# Patient Record
Sex: Male | Born: 1955 | Race: White | Hispanic: No | Marital: Married | Smoking: Never smoker
Health system: Southern US, Community
[De-identification: ages and names within clinical notes are randomized; demographics above are authoritative.]

## PROBLEM LIST (undated history)

## (undated) DIAGNOSIS — C61 Malignant neoplasm of prostate: Secondary | ICD-10-CM

## (undated) DIAGNOSIS — I499 Cardiac arrhythmia, unspecified: Secondary | ICD-10-CM

## (undated) DIAGNOSIS — I10 Essential (primary) hypertension: Secondary | ICD-10-CM

## (undated) DIAGNOSIS — G629 Polyneuropathy, unspecified: Secondary | ICD-10-CM

## (undated) DIAGNOSIS — E119 Type 2 diabetes mellitus without complications: Secondary | ICD-10-CM

## (undated) DIAGNOSIS — Z9289 Personal history of other medical treatment: Secondary | ICD-10-CM

## (undated) DIAGNOSIS — E785 Hyperlipidemia, unspecified: Secondary | ICD-10-CM

## (undated) HISTORY — PX: HERNIA REPAIR: SHX51

---

## 2007-01-10 ENCOUNTER — Emergency Department (HOSPITAL_COMMUNITY): Admission: EM | Admit: 2007-01-10 | Discharge: 2007-01-11 | Payer: Self-pay | Admitting: Emergency Medicine

## 2012-10-04 DIAGNOSIS — Z9289 Personal history of other medical treatment: Secondary | ICD-10-CM

## 2012-10-04 HISTORY — DX: Personal history of other medical treatment: Z92.89

## 2013-08-15 ENCOUNTER — Encounter: Payer: Self-pay | Admitting: Family Medicine

## 2013-08-15 ENCOUNTER — Ambulatory Visit (INDEPENDENT_AMBULATORY_CARE_PROVIDER_SITE_OTHER): Payer: Self-pay | Admitting: Family Medicine

## 2013-08-15 ENCOUNTER — Ambulatory Visit (HOSPITAL_COMMUNITY)
Admission: RE | Admit: 2013-08-15 | Discharge: 2013-08-15 | Disposition: A | Discharge status: Home / Self Care | Payer: Self-pay | Source: Ambulatory Visit | Attending: Family Medicine | Admitting: Family Medicine

## 2013-08-15 VITALS — BP 132/98 | Temp 98.8°F | Ht 74.0 in | Wt 295.0 lb

## 2013-08-15 DIAGNOSIS — R0602 Shortness of breath: Secondary | ICD-10-CM | POA: Insufficient documentation

## 2013-08-15 DIAGNOSIS — R079 Chest pain, unspecified: Secondary | ICD-10-CM

## 2013-08-15 DIAGNOSIS — J9819 Other pulmonary collapse: Secondary | ICD-10-CM | POA: Insufficient documentation

## 2013-08-15 DIAGNOSIS — R7301 Impaired fasting glucose: Secondary | ICD-10-CM

## 2013-08-15 LAB — CBC WITH DIFFERENTIAL/PLATELET
Basophils Absolute: 0 10*3/uL (ref 0.0–0.1)
Basophils Relative: 0 % (ref 0–1)
Eosinophils Absolute: 0.2 10*3/uL (ref 0.0–0.7)
Eosinophils Relative: 3 % (ref 0–5)
MCH: 30.5 pg (ref 26.0–34.0)
MCV: 89.1 fL (ref 78.0–100.0)
Platelets: 203 10*3/uL (ref 150–400)
RDW: 12.7 % (ref 11.5–15.5)
WBC: 6.5 10*3/uL (ref 4.0–10.5)

## 2013-08-15 LAB — BASIC METABOLIC PANEL
Calcium: 9.6 mg/dL (ref 8.4–10.5)
Creat: 0.98 mg/dL (ref 0.50–1.35)

## 2013-08-15 LAB — CK TOTAL AND CKMB (NOT AT ARMC)
CK, MB: 12 ng/mL — ABNORMAL HIGH (ref 0.3–4.0)
Relative Index: 1.9 (ref 0.0–4.0)

## 2013-08-15 MED ORDER — SULFAMETHOXAZOLE-TMP DS 800-160 MG PO TABS: 1.0000 | ORAL_TABLET | Freq: Two times a day (BID) | ORAL | Status: DC

## 2013-08-15 MED ORDER — ALBUTEROL SULFATE HFA 108 (90 BASE) MCG/ACT IN AERS: 2.0000 | INHALATION_SPRAY | Freq: Four times a day (QID) | RESPIRATORY_TRACT | Status: DC | PRN

## 2013-08-15 MED ORDER — ASPIRIN EC 81 MG PO TBEC: 81.0000 mg | DELAYED_RELEASE_TABLET | Freq: Every day | ORAL | Status: DC

## 2013-08-15 NOTE — Progress Notes (Signed)
  Subjective:    Patient ID: Adam Lee, male    DOB: [birth year], 57 y.o.   MRN: [id number]  Cough This is a new problem. The current episode started in the past 7 days. Associated symptoms include headaches and shortness of breath. Associated symptoms comments: Chest congestion.   patient comes in relating that having some congestion and cough but he also relates that he is having shortness of breath he is noted over the past several weeks increased shortness of breath with mild activity it's worse when he does activity  also worse when he lays down he denies chest pain but he states a couple different times he felt like he could not get his breath and he felt like he was going to die. He also relates a couple different times he has had chest tightness.   He has a long history of diabetes, obesity that he has not taken care. This gentleman has not had insurance and is made it very difficult for him.  Patient does not smoke family history past medical history all reviewed    Review of Systems  Respiratory: Positive for cough and shortness of breath.   Neurological: Positive for headaches.   patient relates cough shortness of breath denies swelling in the legs he does relate PND he does relate an episode where he felt some tightness in his chest     Objective:   Physical Exam Neck no abnormal JVD eardrums normal throat is normal lungs diminished breath sounds in the bases more congestion noted in the upper airways heart regular extremities no edema skin warm dry       Assessment & Plan:  Check some stat labs including BNP metabolic 7 troponin I also EKG showed some T wave abnormality but nothing severe await the results of this along with chest x-ray before deciding next that. More than likely will need cardiology referral start baby aspirin today  Diabetes subpar control start metformin once we get lab work back I encouraged patient strongly to followup with [place] every 3 months

## 2013-08-16 ENCOUNTER — Other Ambulatory Visit: Payer: Self-pay | Admitting: *Deleted

## 2013-08-16 ENCOUNTER — Ambulatory Visit (HOSPITAL_COMMUNITY)
Admission: RE | Admit: 2013-08-16 | Discharge: 2013-08-16 | Disposition: A | Discharge status: Home / Self Care | Payer: Self-pay | Source: Ambulatory Visit | Attending: Adult Health | Admitting: Adult Health

## 2013-08-16 ENCOUNTER — Telehealth: Payer: Self-pay | Admitting: Family Medicine

## 2013-08-16 DIAGNOSIS — R0989 Other specified symptoms and signs involving the circulatory and respiratory systems: Secondary | ICD-10-CM | POA: Insufficient documentation

## 2013-08-16 DIAGNOSIS — E785 Hyperlipidemia, unspecified: Secondary | ICD-10-CM

## 2013-08-16 DIAGNOSIS — E119 Type 2 diabetes mellitus without complications: Secondary | ICD-10-CM | POA: Insufficient documentation

## 2013-08-16 DIAGNOSIS — Z6837 Body mass index (BMI) 37.0-37.9, adult: Secondary | ICD-10-CM | POA: Insufficient documentation

## 2013-08-16 DIAGNOSIS — I517 Cardiomegaly: Secondary | ICD-10-CM

## 2013-08-16 DIAGNOSIS — R0609 Other forms of dyspnea: Secondary | ICD-10-CM | POA: Insufficient documentation

## 2013-08-16 DIAGNOSIS — E669 Obesity, unspecified: Secondary | ICD-10-CM | POA: Insufficient documentation

## 2013-08-16 NOTE — Telephone Encounter (Signed)
Cardiology referral is done, I am unable to find his chart so that I can fax over his EKG.  Do you have patient's chart? I have looked everywhere with no luck and patient did state that you called him last night with some lab results. THANKS

## 2013-08-16 NOTE — Progress Notes (Signed)
*  PRELIMINARY RESULTS* Echocardiogram 2D Echocardiogram has been performed.  Adam Lee 08/16/2013, 2:50 PM

## 2013-08-17 LAB — LIPID PANEL
Total CHOL/HDL Ratio: 4.3 Ratio
VLDL: 26 mg/dL (ref 0–40)

## 2013-08-20 ENCOUNTER — Ambulatory Visit (INDEPENDENT_AMBULATORY_CARE_PROVIDER_SITE_OTHER): Payer: Self-pay | Admitting: Cardiovascular Disease

## 2013-08-20 ENCOUNTER — Encounter: Payer: Self-pay | Admitting: *Deleted

## 2013-08-20 ENCOUNTER — Encounter: Payer: Self-pay | Admitting: Cardiovascular Disease

## 2013-08-20 VITALS — BP 118/82 | HR 64 | Ht 74.0 in | Wt 285.0 lb

## 2013-08-20 DIAGNOSIS — E119 Type 2 diabetes mellitus without complications: Secondary | ICD-10-CM | POA: Insufficient documentation

## 2013-08-20 DIAGNOSIS — R0600 Dyspnea, unspecified: Secondary | ICD-10-CM | POA: Insufficient documentation

## 2013-08-20 DIAGNOSIS — R079 Chest pain, unspecified: Secondary | ICD-10-CM | POA: Insufficient documentation

## 2013-08-20 DIAGNOSIS — R0609 Other forms of dyspnea: Secondary | ICD-10-CM

## 2013-08-20 NOTE — Assessment & Plan Note (Signed)
Seems more muscular.  ECG no acute changes.  F/U stress myovue as lateral T wave changes would make ETT non diagnostic  Not clear why CPK elevated but more likely related to rhabdo as troponin is normal  Consider ESR and further w/u for connective tissue or inflamatory disease

## 2013-08-20 NOTE — Assessment & Plan Note (Signed)
EF and BNP normal Not cardiac in origin.  Doubt anginal equivalent. Add claritin or allergy meds consider Netti Pot.  F/U primary CXR with bronchitic changes Consider fluro of left diaphragm to see if it moves

## 2013-08-20 NOTE — Patient Instructions (Signed)
Your physician recommends that you schedule a follow-up appointment in: AS NEEDED  Your physician has requested that you have en exercise stress myoview. For further information please visit [email or web address] Please follow instruction sheet, as given.  Your physician recommends that you return for lab work in: TODAY (SLIPS GIVEN FOR TROPONIN/CPK)   WE WILL CALL YOU WITH YOUR TEST RESULTS/INSTRUCTIONS/NEXT STEPS ONCE RECEIVED BY THE PROVIDER

## 2013-08-20 NOTE — Assessment & Plan Note (Addendum)
Discussed low carb diet and weight loss.  Should probably be started on amaryl or glucophage

## 2013-08-20 NOTE — Progress Notes (Signed)
Patient ID: Adam Lee, male   DOB: 1956-05-14, 57 y.o.   MRN: [id number]   57 yo referred for chest pain, congestion and dyspnea.  He has no known CAD.  Primary concern is dypsnea. He is obese. Has had a "cold" for a few weeks.   Does not take allergy meds. No history of sinus or deviated septum.  Rx with antibiotics and some improvement.  Pains in chest part of overall myalgias.   Plays softball and gets sore all over including chest and shoulders  A1c is elevated at 8.0  LDL is 97.     Echo 08/06/13 reviewed poor quality images but normal EF and mild LVH with no valve disease CXR 08/15/13 bronchitic changes and elevated left hemidiaphram BNP 11/12 normal 29   Troponin negative with CPK 644 RI low 1.9%   ROS: Denies fever, malais, weight loss, blurry vision, decreased visual acuity, cough, sputum, SOB, hemoptysis, pleuritic pain, palpitaitons, heartburn, abdominal pain, melena, lower extremity edema, claudication, or rash.  All other systems reviewed and negative   General: Affect appropriate Obese white male  HEENT: normal Neck supple with no adenopathy JVP normal no bruits no thyromegaly Lungs clear with no wheezing and good diaphragmatic motion Heart:  S1/S2 no murmur,rub, gallop or click PMI normal Abdomen: benighn, BS positve, no tenderness, no AAA no bruit.  No HSM or HJR Distal pulses intact with no bruits No edema Neuro non-focal Skin warm and dry  Multiple skin tags on inside of legs No muscular weakness  Medications Current Outpatient Prescriptions  Medication Sig Dispense Refill  . albuterol (PROVENTIL HFA;VENTOLIN HFA) 108 (90 BASE) MCG/ACT inhaler Inhale 2 puffs into the lungs every 6 (six) hours as needed for wheezing.  1 Inhaler  2  . aspirin EC 81 MG tablet Take 1 tablet (81 mg total) by mouth daily.  30 tablet  6  . sulfamethoxazole-trimethoprim (BACTRIM DS) 800-160 MG per tablet Take 1 tablet by mouth 2 (two) times daily.  14 tablet  0   No current  facility-administered medications for this visit.    Allergies Review of patient's allergies indicates no known allergies.  Family History: No family history on file.  Social History: History   Social History  . Marital Status: Married    Spouse Name: N/A    Number of Children: N/A  . Years of Education: N/A   Occupational History  . Not on file.   Social History Main Topics  . Smoking status: Never Smoker   . Smokeless tobacco: Not on file  . Alcohol Use: Not on file  . Drug Use: Not on file  . Sexual Activity: Not on file   Other Topics Concern  . Not on file   Social History Narrative  . No narrative on file    Electrocardiogram:  SR rate 70 nonspecific lateral T wave changes 08/15/13  Assessment and Plan

## 2013-08-23 ENCOUNTER — Encounter (HOSPITAL_COMMUNITY)
Admission: RE | Admit: 2013-08-23 | Discharge: 2013-08-23 | Disposition: A | Discharge status: Home / Self Care | Payer: Self-pay | Source: Ambulatory Visit | Attending: Cardiovascular Disease | Admitting: Cardiovascular Disease

## 2013-08-23 ENCOUNTER — Encounter (HOSPITAL_COMMUNITY): Payer: Self-pay

## 2013-08-23 DIAGNOSIS — E119 Type 2 diabetes mellitus without complications: Secondary | ICD-10-CM

## 2013-08-23 DIAGNOSIS — R0600 Dyspnea, unspecified: Secondary | ICD-10-CM

## 2013-08-23 DIAGNOSIS — R0609 Other forms of dyspnea: Secondary | ICD-10-CM | POA: Insufficient documentation

## 2013-08-23 DIAGNOSIS — R079 Chest pain, unspecified: Secondary | ICD-10-CM | POA: Insufficient documentation

## 2013-08-23 DIAGNOSIS — R0989 Other specified symptoms and signs involving the circulatory and respiratory systems: Secondary | ICD-10-CM | POA: Insufficient documentation

## 2013-08-23 LAB — TROPONIN I: Troponin I: <0.3 ng/mL (ref <0.30)

## 2013-08-23 LAB — CK TOTAL AND CKMB (NOT AT ARMC): Total CK: 220 U/L (ref 7–232)

## 2013-08-23 MED ORDER — SODIUM CHLORIDE 0.9 % IJ SOLN
INTRAMUSCULAR | Status: AC
  Administered 2013-08-23: 10 mL via INTRAVENOUS
  Filled 2013-08-23: qty 10

## 2013-08-23 MED ORDER — TECHNETIUM TC 99M SESTAMIBI GENERIC - CARDIOLITE
10.0000 | Freq: Once | INTRAVENOUS | Status: AC | PRN
  Administered 2013-08-23: 10 via INTRAVENOUS

## 2013-08-23 MED ORDER — TECHNETIUM TC 99M SESTAMIBI - CARDIOLITE
30.0000 | Freq: Once | INTRAVENOUS | Status: AC | PRN
  Administered 2013-08-23: 13:00:00 30 via INTRAVENOUS

## 2013-08-23 NOTE — Progress Notes (Signed)
Stress Lab Nurses Notes - Adam Lee  Adam Lee 08/23/2013 Reason for doing test: Chest Pain and Dyspnea Type of test: Stress Cardiolite Nurse performing test: Parke Poisson, RN Nuclear Medicine Tech: Lou Cal Echo Tech: Not Applicable MD performing test: Koneswaran/K.Lawrence NP Family MD: Lilyan Punt Test explained and consent signed: yes IV started: 22g jelco, Saline lock flushed, No redness or edema and Saline lock started in radiology Symptoms: mild SOB Treatment/Intervention: None Reason test stopped: fatigue and reached target HR After recovery IV was: Discontinued via X-ray tech and No redness or edema Patient to return to Nuc. Med at : 13:25 Patient discharged: Home Patient's Condition upon discharge was: stable Comments: During test peak BP 179/89 & HR 153. Recovery BP 137/88 & HR 98.  Symptoms resolved in recovery. [name]

## 2013-08-27 ENCOUNTER — Ambulatory Visit: Payer: Self-pay | Admitting: Cardiology

## 2013-08-28 ENCOUNTER — Other Ambulatory Visit: Payer: Self-pay | Admitting: *Deleted

## 2013-08-28 MED ORDER — METFORMIN HCL 500 MG PO TABS: 500.0000 mg | ORAL_TABLET | Freq: Two times a day (BID) | ORAL | Status: DC

## 2013-09-11 ENCOUNTER — Encounter: Payer: Self-pay | Admitting: Family Medicine

## 2013-10-29 ENCOUNTER — Telehealth: Payer: Self-pay | Admitting: Family Medicine

## 2013-10-29 MED ORDER — METFORMIN HCL 500 MG PO TABS: 500.0000 mg | ORAL_TABLET | Freq: Two times a day (BID) | ORAL | Status: DC

## 2013-10-29 NOTE — Telephone Encounter (Signed)
Patient needs Rx for Metformin. He is completely out and needs a refill ASAP. Please call when complete.   [employer]

## 2013-10-29 NOTE — Telephone Encounter (Signed)
30 day supply sent to [facility] Notified patient he needs office visit. Transferred to front desk to schedule.

## 2013-10-31 ENCOUNTER — Encounter: Payer: Self-pay | Admitting: Family Medicine

## 2013-10-31 ENCOUNTER — Ambulatory Visit (INDEPENDENT_AMBULATORY_CARE_PROVIDER_SITE_OTHER): Payer: Self-pay | Admitting: Family Medicine

## 2013-10-31 VITALS — BP 128/86 | Ht 74.0 in | Wt 275.0 lb

## 2013-10-31 DIAGNOSIS — E119 Type 2 diabetes mellitus without complications: Secondary | ICD-10-CM

## 2013-10-31 LAB — POCT GLYCOSYLATED HEMOGLOBIN (HGB A1C): HEMOGLOBIN A1C: 6

## 2013-10-31 MED ORDER — METFORMIN HCL 500 MG PO TABS: 500.0000 mg | ORAL_TABLET | Freq: Two times a day (BID) | ORAL | Status: DC

## 2013-10-31 NOTE — Progress Notes (Signed)
   Subjective:    Patient ID: Adam Lee, male    DOB: 11/09/1955, 58 y.o.   MRN: [id number]  HPI Comments: Last A1C was 2 months ago and it was 8.0  Diabetes He presents for his follow-up diabetic visit. He has type 2 diabetes mellitus. Pertinent negatives for hypoglycemia include no confusion. Pertinent negatives for diabetes include no chest pain, no fatigue, no polydipsia, no polyphagia and no weakness. Current diabetic treatment includes diet and oral agent (monotherapy). His weight is decreasing steadily. He is following a diabetic diet. He participates in exercise daily. His overall blood glucose range is 140-180 mg/dl.    PMH benign, recent cardiac workup negative  Review of Systems  Constitutional: Negative for activity change, appetite change and fatigue.  Respiratory: Negative for cough and chest tightness.   Cardiovascular: Negative for chest pain.  Gastrointestinal: Negative for abdominal pain.  Endocrine: Negative for polydipsia and polyphagia.  Neurological: Negative for weakness.  Psychiatric/Behavioral: Negative for confusion.       Objective:   Physical Exam  Vitals reviewed. Constitutional: He appears well-nourished. No distress.  Cardiovascular: Normal rate, regular rhythm and normal heart sounds.   No murmur heard. Pulmonary/Chest: Effort normal and breath sounds normal. No respiratory distress.  Musculoskeletal: He exhibits no edema.  Lymphadenopathy:    He has no cervical adenopathy.  Neurological: He is alert.  Psychiatric: His behavior is normal.   Lungs are clear hearts regular pulse normal blood pressure good diabetic foot exam normal       Assessment & Plan:  #1 diabetes actually very good control continue current medications metformin twice daily watch diet exercise lose weight and followup again in 6 months time  #2 patient will need to have cholesterol profile rechecked again in 6 months along with A1c  #3 patient currently cannot afford  doing colonoscopy he will discuss it with his wife and get back to [place].

## 2014-08-14 ENCOUNTER — Encounter: Payer: Self-pay | Admitting: Family Medicine

## 2014-08-14 LAB — HM DIABETES EYE EXAM

## 2014-11-07 ENCOUNTER — Telehealth: Payer: Self-pay | Admitting: Family Medicine

## 2014-11-07 MED ORDER — METFORMIN HCL 500 MG PO TABS: 500.0000 mg | ORAL_TABLET | Freq: Two times a day (BID) | ORAL | Status: DC

## 2014-11-07 NOTE — Telephone Encounter (Signed)
Pt is needing a refill on his metformin pt has an appt on Wed but Will be out before then.  [employer]

## 2014-11-07 NOTE — Telephone Encounter (Signed)
Patient notified

## 2014-11-13 ENCOUNTER — Encounter: Payer: Self-pay | Admitting: Family Medicine

## 2014-11-13 ENCOUNTER — Ambulatory Visit (INDEPENDENT_AMBULATORY_CARE_PROVIDER_SITE_OTHER): Payer: BLUE CROSS/BLUE SHIELD | Admitting: Family Medicine

## 2014-11-13 VITALS — BP 120/80 | Ht 74.0 in | Wt 275.0 lb

## 2014-11-13 DIAGNOSIS — E119 Type 2 diabetes mellitus without complications: Secondary | ICD-10-CM

## 2014-11-13 DIAGNOSIS — Z125 Encounter for screening for malignant neoplasm of prostate: Secondary | ICD-10-CM

## 2014-11-13 DIAGNOSIS — E785 Hyperlipidemia, unspecified: Secondary | ICD-10-CM

## 2014-11-13 DIAGNOSIS — I10 Essential (primary) hypertension: Secondary | ICD-10-CM

## 2014-11-13 LAB — POCT GLYCOSYLATED HEMOGLOBIN (HGB A1C): HEMOGLOBIN A1C: 6.9

## 2014-11-13 MED ORDER — METFORMIN HCL 500 MG PO TABS: 500.0000 mg | ORAL_TABLET | Freq: Two times a day (BID) | ORAL | Status: DC

## 2014-11-13 NOTE — Progress Notes (Signed)
   Subjective:    Patient ID: Adam Lee, male    DOB: 09/27/1956, 59 y.o.   MRN: [id number]  Diabetes He presents for his follow-up diabetic visit. He has type 2 diabetes mellitus. There are no hypoglycemic associated symptoms. Pertinent negatives for hypoglycemia include no headaches. There are no diabetic associated symptoms. Pertinent negatives for diabetes include no chest pain and no weakness. Current diabetic treatment includes oral agent (monotherapy). He is compliant with treatment all of the time. He participates in exercise daily. Frequency home blood tests: once a month. He does not see a podiatrist.Eye exam is current.   He denies any problems currently no chest tightness pressure pain   Review of Systems  Constitutional: Negative for fever, activity change and appetite change.  HENT: Negative for congestion and rhinorrhea.   Eyes: Negative for discharge.  Respiratory: Negative for cough and wheezing.   Cardiovascular: Negative for chest pain.  Gastrointestinal: Negative for vomiting, abdominal pain and blood in stool.  Genitourinary: Negative for frequency and difficulty urinating.  Musculoskeletal: Negative for neck pain.  Skin: Negative for rash.  Allergic/Immunologic: Negative for environmental allergies and food allergies.  Neurological: Negative for weakness and headaches.  Psychiatric/Behavioral: Negative for agitation.       Objective:   Physical Exam  Constitutional: He appears well-developed and well-nourished.  HENT:  Head: Normocephalic and atraumatic.  Right Ear: External ear normal.  Left Ear: External ear normal.  Nose: Nose normal.  Mouth/Throat: Oropharynx is clear and moist.  Eyes: EOM are normal. Pupils are equal, round, and reactive to light.  Neck: Normal range of motion. Neck supple. No thyromegaly present.  Cardiovascular: Normal rate, regular rhythm and normal heart sounds.   No murmur heard. Pulmonary/Chest: Effort normal and breath sounds  normal. No respiratory distress. He has no wheezes.  Abdominal: Soft. Bowel sounds are normal. He exhibits no distension and no mass. There is no tenderness.  Musculoskeletal: Normal range of motion. He exhibits no edema.  Lymphadenopathy:    He has no cervical adenopathy.  Neurological: He is alert. He exhibits normal muscle tone.  Skin: Skin is warm and dry. No erythema.  Psychiatric: He has a normal mood and affect. His behavior is normal. Judgment normal.    Prostate exam normal      Assessment & Plan:  Patient watch diet closely exercise. Try to bring his weight down. Minimize starches. Continue medication. Follow-up in approximately 4 months for A1c.

## 2014-12-07 LAB — MICROALBUMIN, URINE: MICROALBUM., U, RANDOM: 6.8 ug/mL (ref 0.0–17.0)

## 2014-12-07 LAB — BASIC METABOLIC PANEL
BUN / CREAT RATIO: 20 (ref 10–22)
BUN: 23 mg/dL (ref 8–27)
CALCIUM: 9.1 mg/dL (ref 8.6–10.2)
CO2: 24 mmol/L (ref 18–29)
CREATININE: 1.15 mg/dL (ref 0.76–1.27)
Chloride: 102 mmol/L (ref 97–108)
GFR calc Af Amer: 79 mL/min/{1.73_m2} (ref >59)
GFR calc non Af Amer: 68 mL/min/{1.73_m2} (ref >59)
GLUCOSE: 124 mg/dL — AB (ref 65–99)
Potassium: 4.1 mmol/L (ref 3.5–5.2)
SODIUM: 141 mmol/L (ref 134–144)

## 2014-12-07 LAB — LIPID PANEL
CHOL/HDL RATIO: 2.8 ratio (ref 0.0–5.0)
Cholesterol, Total: 158 mg/dL (ref 100–199)
HDL: 57 mg/dL (ref >39)
LDL Calculated: 84 mg/dL (ref 0–99)
Triglycerides: 83 mg/dL (ref 0–149)
VLDL CHOLESTEROL CAL: 17 mg/dL (ref 5–40)

## 2014-12-07 LAB — PSA: PSA: 2.3 ng/mL (ref 0.0–4.0)

## 2014-12-08 ENCOUNTER — Encounter: Payer: Self-pay | Admitting: Family Medicine

## 2015-05-06 ENCOUNTER — Emergency Department (HOSPITAL_COMMUNITY): Payer: BLUE CROSS/BLUE SHIELD

## 2015-05-06 ENCOUNTER — Observation Stay (HOSPITAL_COMMUNITY)
Admission: EM | Admit: 2015-05-06 | Discharge: 2015-05-08 | Disposition: A | Discharge status: Home / Self Care | Payer: BLUE CROSS/BLUE SHIELD | Attending: Internal Medicine | Admitting: Internal Medicine

## 2015-05-06 ENCOUNTER — Encounter (HOSPITAL_COMMUNITY): Payer: Self-pay | Admitting: Emergency Medicine

## 2015-05-06 DIAGNOSIS — R0602 Shortness of breath: Principal | ICD-10-CM

## 2015-05-06 DIAGNOSIS — Z79899 Other long term (current) drug therapy: Secondary | ICD-10-CM | POA: Insufficient documentation

## 2015-05-06 DIAGNOSIS — E1169 Type 2 diabetes mellitus with other specified complication: Secondary | ICD-10-CM

## 2015-05-06 DIAGNOSIS — R911 Solitary pulmonary nodule: Secondary | ICD-10-CM | POA: Diagnosis present

## 2015-05-06 DIAGNOSIS — I499 Cardiac arrhythmia, unspecified: Secondary | ICD-10-CM | POA: Insufficient documentation

## 2015-05-06 DIAGNOSIS — I498 Other specified cardiac arrhythmias: Secondary | ICD-10-CM | POA: Insufficient documentation

## 2015-05-06 DIAGNOSIS — I10 Essential (primary) hypertension: Secondary | ICD-10-CM | POA: Diagnosis not present

## 2015-05-06 DIAGNOSIS — E785 Hyperlipidemia, unspecified: Secondary | ICD-10-CM | POA: Insufficient documentation

## 2015-05-06 DIAGNOSIS — E109 Type 1 diabetes mellitus without complications: Secondary | ICD-10-CM

## 2015-05-06 DIAGNOSIS — E119 Type 2 diabetes mellitus without complications: Secondary | ICD-10-CM | POA: Insufficient documentation

## 2015-05-06 DIAGNOSIS — Z7982 Long term (current) use of aspirin: Secondary | ICD-10-CM | POA: Insufficient documentation

## 2015-05-06 DIAGNOSIS — I493 Ventricular premature depolarization: Secondary | ICD-10-CM | POA: Diagnosis not present

## 2015-05-06 HISTORY — DX: Personal history of other medical treatment: Z92.89

## 2015-05-06 HISTORY — DX: Hyperlipidemia, unspecified: E78.5

## 2015-05-06 HISTORY — DX: Essential (primary) hypertension: I10

## 2015-05-06 HISTORY — DX: Type 2 diabetes mellitus without complications: E11.9

## 2015-05-06 LAB — COMPREHENSIVE METABOLIC PANEL
ALT: 21 U/L (ref 17–63)
ANION GAP: 8 (ref 5–15)
AST: 21 U/L (ref 15–41)
Albumin: 4.2 g/dL (ref 3.5–5.0)
Alkaline Phosphatase: 63 U/L (ref 38–126)
BUN: 22 mg/dL — ABNORMAL HIGH (ref 6–20)
CO2: 29 mmol/L (ref 22–32)
CREATININE: 1.13 mg/dL (ref 0.61–1.24)
Calcium: 9.1 mg/dL (ref 8.9–10.3)
Chloride: 102 mmol/L (ref 101–111)
GFR calc Af Amer: >60 mL/min (ref >60)
GFR calc non Af Amer: >60 mL/min (ref >60)
Glucose, Bld: 148 mg/dL — ABNORMAL HIGH (ref 65–99)
Potassium: 4.1 mmol/L (ref 3.5–5.1)
SODIUM: 139 mmol/L (ref 135–145)
TOTAL PROTEIN: 7.5 g/dL (ref 6.5–8.1)
Total Bilirubin: 0.7 mg/dL (ref 0.3–1.2)

## 2015-05-06 LAB — CBC
HEMATOCRIT: 46.9 % (ref 39.0–52.0)
HEMOGLOBIN: 15.8 g/dL (ref 13.0–17.0)
MCH: 30.3 pg (ref 26.0–34.0)
MCHC: 33.7 g/dL (ref 30.0–36.0)
MCV: 90 fL (ref 78.0–100.0)
PLATELETS: 203 10*3/uL (ref 150–400)
RBC: 5.21 MIL/uL (ref 4.22–5.81)
RDW: 13.1 % (ref 11.5–15.5)
WBC: 7.5 10*3/uL (ref 4.0–10.5)

## 2015-05-06 LAB — TROPONIN I
Troponin I: <0.03 ng/mL (ref <0.031)
Troponin I: <0.03 ng/mL (ref <0.031)

## 2015-05-06 LAB — GLUCOSE, CAPILLARY: Glucose-Capillary: 147 mg/dL — ABNORMAL HIGH (ref 65–99)

## 2015-05-06 LAB — PROTIME-INR
INR: 1.02 (ref 0.00–1.49)
Prothrombin Time: 13.6 seconds (ref 11.6–15.2)

## 2015-05-06 LAB — APTT: APTT: 26 s (ref 24–37)

## 2015-05-06 LAB — BRAIN NATRIURETIC PEPTIDE: B NATRIURETIC PEPTIDE 5: 22 pg/mL (ref 0.0–100.0)

## 2015-05-06 LAB — D-DIMER, QUANTITATIVE (NOT AT ARMC): D DIMER QUANT: 0.77 ug{FEU}/mL — AB (ref 0.00–0.48)

## 2015-05-06 MED ORDER — ENOXAPARIN SODIUM 40 MG/0.4ML ~~LOC~~ SOLN
40.0000 mg | SUBCUTANEOUS | Status: DC
  Administered 2015-05-06: 40 mg via SUBCUTANEOUS
  Filled 2015-05-06: qty 0.4

## 2015-05-06 MED ORDER — GI COCKTAIL ~~LOC~~: 30.0000 mL | Freq: Four times a day (QID) | ORAL | Status: DC | PRN

## 2015-05-06 MED ORDER — ACETAMINOPHEN 325 MG PO TABS: 650.0000 mg | ORAL_TABLET | Freq: Four times a day (QID) | ORAL | Status: DC | PRN

## 2015-05-06 MED ORDER — ASPIRIN 81 MG PO CHEW
324.0000 mg | CHEWABLE_TABLET | Freq: Once | ORAL | Status: AC
  Administered 2015-05-06: 324 mg via ORAL
  Filled 2015-05-06: qty 4

## 2015-05-06 MED ORDER — ONDANSETRON HCL 4 MG/2ML IJ SOLN: 4.0000 mg | Freq: Three times a day (TID) | INTRAMUSCULAR | Status: DC | PRN

## 2015-05-06 MED ORDER — ONDANSETRON HCL 4 MG/2ML IJ SOLN: 4.0000 mg | Freq: Four times a day (QID) | INTRAMUSCULAR | Status: DC | PRN

## 2015-05-06 MED ORDER — SODIUM CHLORIDE 0.9 % IJ SOLN: 3.0000 mL | Freq: Two times a day (BID) | INTRAMUSCULAR | Status: DC

## 2015-05-06 MED ORDER — SODIUM CHLORIDE 0.9 % IJ SOLN
3.0000 mL | Freq: Two times a day (BID) | INTRAMUSCULAR | Status: DC
  Administered 2015-05-06 – 2015-05-08 (×4): 3 mL via INTRAVENOUS

## 2015-05-06 MED ORDER — INSULIN ASPART 100 UNIT/ML ~~LOC~~ SOLN
0.0000 [IU] | Freq: Three times a day (TID) | SUBCUTANEOUS | Status: DC
  Administered 2015-05-07: 1 [IU] via SUBCUTANEOUS
  Administered 2015-05-07: 2 [IU] via SUBCUTANEOUS
  Administered 2015-05-07: 1 [IU] via SUBCUTANEOUS
  Administered 2015-05-08: 2 [IU] via SUBCUTANEOUS
  Administered 2015-05-08: 1 [IU] via SUBCUTANEOUS

## 2015-05-06 MED ORDER — OXYCODONE HCL 5 MG PO TABS: 5.0000 mg | ORAL_TABLET | ORAL | Status: DC | PRN

## 2015-05-06 MED ORDER — IOHEXOL 350 MG/ML SOLN
100.0000 mL | Freq: Once | INTRAVENOUS | Status: AC | PRN
  Administered 2015-05-06: 100 mL via INTRAVENOUS

## 2015-05-06 MED ORDER — NITROGLYCERIN 0.4 MG SL SUBL: 0.4000 mg | SUBLINGUAL_TABLET | SUBLINGUAL | Status: DC | PRN

## 2015-05-06 MED ORDER — ACETAMINOPHEN 650 MG RE SUPP: 650.0000 mg | Freq: Four times a day (QID) | RECTAL | Status: DC | PRN

## 2015-05-06 MED ORDER — ASPIRIN EC 81 MG PO TBEC
81.0000 mg | DELAYED_RELEASE_TABLET | Freq: Every day | ORAL | Status: DC
  Administered 2015-05-07 – 2015-05-08 (×2): 81 mg via ORAL
  Filled 2015-05-06 (×2): qty 1

## 2015-05-06 MED ORDER — SODIUM CHLORIDE 0.9 % IV SOLN: 250.0000 mL | INTRAVENOUS | Status: DC | PRN

## 2015-05-06 MED ORDER — SODIUM CHLORIDE 0.9 % IJ SOLN: 3.0000 mL | INTRAMUSCULAR | Status: DC | PRN

## 2015-05-06 MED ORDER — ENOXAPARIN SODIUM 40 MG/0.4ML ~~LOC~~ SOLN
40.0000 mg | SUBCUTANEOUS | Status: DC
Start: 2015-05-06 — End: 2015-05-06

## 2015-05-06 MED ORDER — ACETAMINOPHEN 325 MG PO TABS: 650.0000 mg | ORAL_TABLET | ORAL | Status: DC | PRN

## 2015-05-06 NOTE — ED Notes (Signed)
EKG handed to Dr. Sabra Heck,  Notified MD of "low hr" and frequent PVC's.

## 2015-05-06 NOTE — ED Notes (Signed)
Report given to floor,  

## 2015-05-06 NOTE — ED Notes (Signed)
Received report on pt, pt sitting semi fowlers in bed, denies any pain, no distress noted, pt has multi pvc's on monitor, family at bedside, pt and family updated on plan of care,

## 2015-05-06 NOTE — ED Provider Notes (Signed)
CSN: [id number]     Arrival date & time 05/06/15  1539 History   First MD Initiated Contact with Patient 05/06/15 1544     Chief Complaint  Patient presents with  . Shortness of Breath     (Consider location/radiation/quality/duration/timing/severity/associated sxs/prior Treatment) HPI Comments: The patient is a 59 year old male with a history of type 2 diabetes, taking metformin daily. Also with a history of hypertension, he had a stress test performed in 2014 without any abnormal findings. The patient presents to the hospital today with a complaint of shortness of breath. He states that he awoke from sleep with an intense feeling of shortness of breath like he was going to smother. He denies chest pain with this. He states he feels like it was going to pass out for a brief second, the symptoms have gradually improved and he no longer feels that sensation. He denies any swelling of the lower extremities but does have some swelling in the feet which is mild, he does have a chronic cough which is nonproductive, he has not had fevers chills nausea vomiting or diarrhea and has no abdominal pain or back pain.  Stress result from 11/14  Abnormal, intermediate risk exercise Cardiolite. No diagnostic ST segment changes or chest pain at maximum work load of 9 METS. Hypertensive response noted. Perfusion imaging suggests scar in the mid to basal anteroseptal wall with mild ischemia at the base of the defect. This is somewhat atypical for the course of the major epicardial vessel. There is also suggestion of scar in the inferolateral wall with mild ischemia near the apex. Findings are concerning for the presence of underlying ischemic heart disease. LVEF is calculated at 40% with wall motion abnormalities described above.  Patient is a 58 y.o. male presenting with shortness of breath. The history is provided by the patient and the spouse.  Shortness of Breath   Past Medical History  Diagnosis Date   . History of stress test 2014    intermediate risk  . Hypertension   . Diabetes mellitus without complication   . Hyperlipemia    History reviewed. No pertinent past surgical history. Family History  Problem Relation Age of Onset  . Diabetes Father   . Heart disease Mother   . Cancer     History  Substance Use Topics  . Smoking status: Never Smoker   . Smokeless tobacco: Not on file  . Alcohol Use: No    Review of Systems  Respiratory: Positive for shortness of breath.   All other systems reviewed and are negative.     Allergies  Review of patient's allergies indicates no known allergies.  Home Medications   Prior to Admission medications   Medication Sig Start Date End Date Taking? Authorizing Provider  albuterol (PROVENTIL HFA;VENTOLIN HFA) 108 (90 BASE) MCG/ACT inhaler Inhale 2 puffs into the lungs every 6 (six) hours as needed for wheezing. 08/15/13  Yes Kathyrn Drown, MD  metFORMIN (GLUCOPHAGE) 500 MG tablet Take 1 tablet (500 mg total) by mouth 2 (two) times daily with a meal. 11/13/14  Yes Kathyrn Drown, MD  aspirin EC 81 MG tablet Take 1 tablet (81 mg total) by mouth daily. Patient not taking: Reported on 05/06/2015 08/15/13   Kathyrn Drown, MD   BP 168/100 mmHg  Pulse 60  Temp(Src) 98 F (36.7 C) (Oral)  Resp 18  Ht 6\' 3"  (1.905 m)  Wt 278 lb (126.1 kg)  BMI 34.75 kg/m2  SpO2 97% Physical Exam  Constitutional: He appears well-developed and well-nourished. No distress.  HENT:  Head: Normocephalic and atraumatic.  Mouth/Throat: Oropharynx is clear and moist. No oropharyngeal exudate.  Eyes: Conjunctivae and EOM are normal. Pupils are equal, round, and reactive to light. Right eye exhibits no discharge. Left eye exhibits no discharge. No scleral icterus.  Neck: Normal range of motion. Neck supple. No JVD present. No thyromegaly present.  Cardiovascular: Normal rate, regular rhythm, normal heart sounds and intact distal pulses.  Exam reveals no gallop and  no friction rub.   No murmur heard. Pulmonary/Chest: Effort normal. No respiratory distress. He has no wheezes. He has no rales.  Decreased lung sounds at the left base, otherwise normal lung sounds throughout, no distress, speaks in full sentences  Abdominal: Soft. Bowel sounds are normal. He exhibits no distension and no mass. There is no tenderness.  Musculoskeletal: Normal range of motion. He exhibits no edema or tenderness.  Lymphadenopathy:    He has no cervical adenopathy.  Neurological: He is alert. Coordination normal.  Skin: Skin is warm and dry. No rash noted. No erythema.  Psychiatric: He has a normal mood and affect. His behavior is normal.  Nursing note and vitals reviewed.   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 Multiplanar CT image reconstructions and MIPs were obtained to evaluate the vascular anatomy.  CONTRAST:  136mL OMNIPAQUE IOHEXOL 350 MG/ML SOLN  COMPARISON:  Current chest radiograph  FINDINGS: Angiographic study: No evidence of a pulmonary embolus. Great vessels are normal in caliber. No aortic dissection or plaque.  Thoracic inlet:  No masses or adenopathy.  Mediastinum and hila:  Heart normal in size and configuration. Minor coronary artery calcifications. No mediastinal or hilar masses or pathologically enlarged lymph nodes.  Lungs and pleural: No lung consolidation or edema. Mild atelectasis at both lung bases. 8 mm pleural-based nodule in the left lower lobe, image 43, series 5. 3 mm peripheral nodule in the right lower lobe, image 55, series 5. No other lung nodules. No pleural effusion. No pneumothorax.  Limited upper abdomen: Elevated left hemidiaphragm. No acute findings.  Musculoskeletal: Degenerative disc space narrowing and endplate spurring throughout the thoracic spine. No osteoblastic or osteolytic lesions.  Review of the MIP images confirms the above findings.  IMPRESSION: 1. No evidence of a pulmonary embolus. 2. No acute findings. 3. Mild subsegmental lung base atelectasis, greater on the left. Elevated left hemidiaphragm. 4. Small pulmonary nodules detailed above. If the patient is at high risk for bronchogenic carcinoma, follow-up chest CT at 3-3months is recommended. If the patient is at low risk for bronchogenic carcinoma, follow-up chest CT at 6-12 months is  recommended. This recommendation follows the consensus statement: Guidelines for Management of Small Pulmonary Nodules Detected on CT Scans: A Statement from the [place] as published in Radiology 2005; 237:395-400.   Electronically Signed   By: Lajean Manes M.D.   On: 05/06/2015 18:01   Dg Chest Portable 1 View  05/06/2015   CLINICAL DATA:  Chest pain, shortness of Breath  EXAM: PORTABLE CHEST - 1 VIEW  COMPARISON:  08/15/2013  FINDINGS: Cardiomediastinal silhouette is stable. There is chronic elevation of the left hemidiaphragm. No acute infiltrate or pleural effusion. No pulmonary edema.  IMPRESSION: No active disease.  Chronic elevation of the left hemidiaphragm.   Electronically Signed   By: Lahoma Crocker M.D.   On: 05/06/2015 16:33     EKG Interpretation   Date/Time:  Tuesday May 06 2015  15:48:53 EDT Ventricular Rate:  77 PR Interval:  169 QRS Duration: 104 QT Interval:  385 QTC Calculation: 436 R Axis:   43 Text Interpretation:  Sinus rhythm Abnormal R-wave progression, early  transition ECG OTHERWISE WITHIN NORMAL LIMITS No old tracing to compare  Confirmed by Shelsie Tijerino  MD, Phylicia Mcgaugh ([id number]) on 05/06/2015 4:03:43 PM      MDM   Final diagnoses:  SOB (shortness of breath)  Cardiac arrhythmia, unspecified cardiac arrhythmia type  Pulmonary nodule    The patient has hypertension and decreased lung sounds otherwise his exam is unremarkable, EKG unremarkable, labs pending, chest x-ray pending. His stress test was not normal, it was intermediate risk as diagnosed by cardiology as read, his echocardiogram was nondiagnostic and get very little useful clinical information as well. The patient does play softball weekly and never has any exertional symptoms.  I have personally viewed and interpreted the imaging and agree with radiologist interpretation.  CXR with chronic elevation of L hemidiaphragm - no other pulmonary findings    Discussed care with the hospitalist Dr. Barbaraann Faster, he will see the patient in the emergency department for admission. The patient continues to have multiple PVCs in a pattern of trigeminy, several short runs of ventricular tachycardia that resolved spontaneously. He has no abnormal vital signs other than some mild hypertension. The patient will be admitted to the telemetry unit for observation. Holding orders written.  Noemi Chapel, MD 05/06/15 [phone]

## 2015-05-06 NOTE — ED Notes (Signed)
MD at bedside. 

## 2015-05-06 NOTE — ED Notes (Signed)
Works third shift.  Woke at 11 am with sob ,  Continues to be sob and feels like he is going to pass out.

## 2015-05-06 NOTE — ED Notes (Signed)
Dr. Merrell at bedside. 

## 2015-05-06 NOTE — H&P (Addendum)
[facility] History and Physical  Deangleo Passage LFY:[id number] DOB: October 15, 1955 DOA: 05/06/2015  Referring physician: Dr Sabra Heck - [facility] PCP: Sallee Lange, MD   Chief Complaint: SOB   HPI: Adam Lee is a 59 y.o. male  Shortness of breath: Patient states that symptoms started acutely this morning when he awoke this morning and ambulated to the bathroom. States that his shortness of breath and feel like he was being smothered. Denies any actual chest pain. States he feels like he was going to pass out for several seconds when symptoms first started. Symptoms lasted 5 hours. Nothing made symptoms worse. Gradually improved w/ time.  Similar symptoms 1 year ago for which pt underwent cardiac workup including exercise stress test showing intermediate risk results.  Associated w/ some nausea. Denies CP, palpitations, Radiation of symptoms.    Review of Systems:  Constitutional:  No weight loss, night sweats, Fevers, chills, fatigue.  HEENT:  No headaches, Difficulty swallowing,Tooth/dental problems,Sore throat,  No sneezing, itching, ear ache, nasal congestion, post nasal drip,  Cardio-vascular: Per HPi GI:  No heartburn, indigestion, abdominal pain, nausea, vomiting, diarrhea, change in bowel habits, loss of appetite  Resp:   No excess mucus, no productive cough, No non-productive cough, No coughing up of blood.No change in color of mucus.No wheezing.No chest wall deformity  Skin:  no rash or lesions.  GU:  no dysuria, change in color of urine, no urgency or frequency. No flank pain.  Musculoskeletal:   No joint pain or swelling. No decreased range of motion. No back pain.  Psych:  No change in mood or affect. No depression or anxiety. No memory loss.   Past Medical History  Diagnosis Date  . History of stress test 2014    intermediate risk  . Hypertension   . Diabetes mellitus without complication   . Hyperlipemia    History reviewed. No pertinent past surgical  history. Social History:  reports that he has never smoked. He does not have any smokeless tobacco history on file. He reports that he does not drink alcohol or use illicit drugs.  No Known Allergies  Family History  Problem Relation Age of Onset  . Diabetes Father   . Heart disease Mother   . Cancer       Prior to Admission medications   Medication Sig Start Date End Date Taking? Authorizing Provider  albuterol (PROVENTIL HFA;VENTOLIN HFA) 108 (90 BASE) MCG/ACT inhaler Inhale 2 puffs into the lungs every 6 (six) hours as needed for wheezing. 08/15/13  Yes Kathyrn Drown, MD  metFORMIN (GLUCOPHAGE) 500 MG tablet Take 1 tablet (500 mg total) by mouth 2 (two) times daily with a meal. 11/13/14  Yes Kathyrn Drown, MD  aspirin EC 81 MG tablet Take 1 tablet (81 mg total) by mouth daily. Patient not taking: Reported on 05/06/2015 08/15/13   Kathyrn Drown, MD   Physical Exam: Filed Vitals:   05/06/15 1800 05/06/15 1815 05/06/15 1830 05/06/15 1900  BP: 167/91  168/100 155/92  Pulse:  60    Temp:      TempSrc:      Resp:  26 18 21   Height:      Weight:      SpO2:  97%      Wt Readings from Last 3 Encounters:  05/06/15 126.1 kg (278 lb)  11/13/14 124.739 kg (275 lb)  10/31/13 124.739 kg (275 lb)    General:  Appears calm and comfortable Eyes:  PERRL, normal lids, irises & conjunctiva  ENT:  grossly normal hearing, lips & tongue Neck:  no LAD, masses or thyromegaly Cardiovascular:  RRR, no m/r/g. No LE edema. Telemetry:  SR, no arrhythmias  Respiratory:  CTA bilaterally, no w/r/r. Normal respiratory effort. Abdomen:  soft, ntnd Skin:  no rash or induration seen on limited exam Musculoskeletal:  grossly normal tone BUE/BLE Psychiatric:  grossly normal mood and affect, speech fluent and appropriate Neurologic:  grossly non-focal.          Labs on Admission:  Basic Metabolic Panel:  Recent Labs Lab 05/06/15 1553  NA 139  K 4.1  CL 102  CO2 29  GLUCOSE 148*  BUN 22*   CREATININE 1.13  CALCIUM 9.1   Liver Function Tests:  Recent Labs Lab 05/06/15 1553  AST 21  ALT 21  ALKPHOS 63  BILITOT 0.7  PROT 7.5  ALBUMIN 4.2   No results for input(s): LIPASE, AMYLASE in the last 168 hours. No results for input(s): AMMONIA in the last 168 hours. CBC:  Recent Labs Lab 05/06/15 1553  WBC 7.5  HGB 15.8  HCT 46.9  MCV 90.0  PLT 203   Cardiac Enzymes:  Recent Labs Lab 05/06/15 1553  TROPONINI <0.03    BNP (last 3 results)  Recent Labs  05/06/15 1553  BNP 22.0    ProBNP (last 3 results) No results for input(s): PROBNP in the last 8760 hours.  CBG: No results for input(s): GLUCAP in the last 168 hours.  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 Multiplanar CT image reconstructions and MIPs were obtained to evaluate the vascular anatomy.  CONTRAST:  14mL OMNIPAQUE IOHEXOL 350 MG/ML SOLN  COMPARISON:  Current chest radiograph  FINDINGS: Angiographic study: No evidence of a pulmonary embolus. Great vessels are normal in caliber. No aortic dissection or plaque.  Thoracic inlet:  No masses or adenopathy.  Mediastinum and hila: Heart normal in size and configuration. Minor coronary artery calcifications. No mediastinal or hilar masses or pathologically enlarged lymph nodes.  Lungs and pleural: No lung consolidation or edema. Mild atelectasis at both lung bases. 8 mm pleural-based nodule in the left lower lobe, image 43, series 5. 3 mm peripheral nodule in the right lower lobe, image 55, series 5. No other lung nodules. No pleural effusion. No pneumothorax.  Limited upper abdomen: Elevated left hemidiaphragm. No acute  findings.  Musculoskeletal: Degenerative disc space narrowing and endplate spurring throughout the thoracic spine. No osteoblastic or osteolytic lesions.  Review of the MIP images confirms the above findings.  IMPRESSION: 1. No evidence of a pulmonary embolus. 2. No acute findings. 3. Mild subsegmental lung base atelectasis, greater on the left. Elevated left hemidiaphragm. 4. Small pulmonary nodules detailed above. If the patient is at high risk for bronchogenic carcinoma, follow-up chest CT at 3-40months is recommended. If the patient is at low risk for bronchogenic carcinoma, follow-up chest CT at 6-12 months is recommended. This recommendation follows the consensus statement: Guidelines for Management of Small Pulmonary Nodules Detected on CT Scans: A Statement from the [place] as published in Radiology 2005; 237:395-400.   Electronically Signed   By: Lajean Manes M.D.   On: 05/06/2015  18:01   Dg Chest Portable 1 View  05/06/2015   CLINICAL DATA:  Chest pain, shortness of Breath  EXAM: PORTABLE CHEST - 1 VIEW  COMPARISON:  08/15/2013  FINDINGS: Cardiomediastinal silhouette is stable. There is chronic elevation of the left hemidiaphragm. No acute infiltrate or pleural effusion. No pulmonary edema.  IMPRESSION: No active disease.  Chronic elevation of the left hemidiaphragm.   Electronically Signed   By: Lahoma Crocker M.D.   On: 05/06/2015 16:33     Assessment/Plan Principal Problem:   SOB (shortness of breath) Active Problems:   Type II diabetes mellitus, well controlled   Frequent PVCs   Pulmonary nodule   SOB: Currently asymptomatic. Concern for cardiac etiology given description of sx, abnormal EKG and history of intermediate risk cardiac stress test. Cardiologist is Dr. Johnsie Cancel. PVCs approximately every 3-5 beats on cardiac monitor during encounter. EKG with abnormal R-wave progression but sinus and no overt sign of ACS. CTA without evidence of acute respiratory process such as pneumonia  or PE.  - Telemetry - Cardiology consult in a.m. (ordered through Epic) - Cycle troponins - EKG in a.m. - Continue ASA  Diabetes: on metformin only. Glucose 148 ion admission. Last A1c 6.9 on 11/13/2014. - sensitive SSI - A1c  Pulmonary nodules: noted on CT. Recommend f/u scan in 3-6 mo. No other signs or sx of malignancy.  - arrange as outpt.    Code Status: FULL DVT Prophylaxis: Lovenox Family Communication: Wife Disposition Plan: Pending r/o  Brooke Payes Lenna Sciara, MD Family Medicine [facility] [email or web address] Password [email or web address]

## 2015-05-07 DIAGNOSIS — R911 Solitary pulmonary nodule: Secondary | ICD-10-CM | POA: Diagnosis not present

## 2015-05-07 DIAGNOSIS — E119 Type 2 diabetes mellitus without complications: Secondary | ICD-10-CM | POA: Diagnosis not present

## 2015-05-07 DIAGNOSIS — I10 Essential (primary) hypertension: Secondary | ICD-10-CM | POA: Diagnosis present

## 2015-05-07 DIAGNOSIS — R0602 Shortness of breath: Secondary | ICD-10-CM

## 2015-05-07 DIAGNOSIS — R931 Abnormal findings on diagnostic imaging of heart and coronary circulation: Secondary | ICD-10-CM

## 2015-05-07 DIAGNOSIS — I493 Ventricular premature depolarization: Secondary | ICD-10-CM | POA: Diagnosis not present

## 2015-05-07 LAB — COMPREHENSIVE METABOLIC PANEL
ALK PHOS: 59 U/L (ref 38–126)
ALT: 22 U/L (ref 17–63)
ANION GAP: 7 (ref 5–15)
AST: 21 U/L (ref 15–41)
Albumin: 3.8 g/dL (ref 3.5–5.0)
BUN: 21 mg/dL — ABNORMAL HIGH (ref 6–20)
CO2: 27 mmol/L (ref 22–32)
CREATININE: 1.03 mg/dL (ref 0.61–1.24)
Calcium: 8.6 mg/dL — ABNORMAL LOW (ref 8.9–10.3)
Chloride: 105 mmol/L (ref 101–111)
GFR calc Af Amer: >60 mL/min (ref >60)
GFR calc non Af Amer: >60 mL/min (ref >60)
GLUCOSE: 186 mg/dL — AB (ref 65–99)
POTASSIUM: 3.7 mmol/L (ref 3.5–5.1)
Sodium: 139 mmol/L (ref 135–145)
TOTAL PROTEIN: 6.8 g/dL (ref 6.5–8.1)
Total Bilirubin: 0.9 mg/dL (ref 0.3–1.2)

## 2015-05-07 LAB — GLUCOSE, CAPILLARY
GLUCOSE-CAPILLARY: 139 mg/dL — AB (ref 65–99)
Glucose-Capillary: 147 mg/dL — ABNORMAL HIGH (ref 65–99)
Glucose-Capillary: 165 mg/dL — ABNORMAL HIGH (ref 65–99)
Glucose-Capillary: 140 mg/dL — ABNORMAL HIGH (ref 65–99)

## 2015-05-07 LAB — HEMOGLOBIN A1C
HEMOGLOBIN A1C: 7.5 % — AB (ref 4.8–5.6)
Mean Plasma Glucose: 169 mg/dL

## 2015-05-07 LAB — TROPONIN I

## 2015-05-07 MED ORDER — LISINOPRIL 10 MG PO TABS
10.0000 mg | ORAL_TABLET | Freq: Every day | ORAL | Status: DC
  Administered 2015-05-07 – 2015-05-08 (×2): 10 mg via ORAL
  Filled 2015-05-07 (×2): qty 1

## 2015-05-07 MED ORDER — ENOXAPARIN SODIUM 60 MG/0.6ML ~~LOC~~ SOLN
60.0000 mg | SUBCUTANEOUS | Status: DC
  Administered 2015-05-07: 60 mg via SUBCUTANEOUS
  Filled 2015-05-07: qty 0.6

## 2015-05-07 MED ORDER — OMEGA-3-ACID ETHYL ESTERS 1 G PO CAPS
1.0000 g | ORAL_CAPSULE | Freq: Two times a day (BID) | ORAL | Status: DC
  Administered 2015-05-07 – 2015-05-08 (×3): 1 g via ORAL
  Filled 2015-05-07 (×3): qty 1

## 2015-05-07 NOTE — Progress Notes (Signed)
[facility] PROGRESS NOTE  Adam Lee IWL:[id number] DOB: October 11, 1955    PCP:   Adam Lange, MD   HPI: Adam Lee is an 59 y.o. male admitted for SOB, with prior Myoview 2014 showing intermediate risk, found to have severe HTN.  Cardiology seen him and planned to start him on anti HTN therapy, and reassess outpatient in a couple of weeks. CTPA negative for PE, but pulmonary nodule needing follow up.   Rewiew of Systems:  Constitutional: Negative for malaise, fever and chills. No significant weight loss or weight gain Eyes: Negative for eye pain, redness and discharge, diplopia, visual changes, or flashes of light. ENMT: Negative for ear pain, hoarseness, nasal congestion, sinus pressure and sore throat. No headaches; tinnitus, drooling, or problem swallowing. Cardiovascular: Negative for chest pain, palpitations, diaphoresis, dyspnea and peripheral edema. ; No orthopnea, PND Respiratory: Negative for cough, hemoptysis, wheezing and stridor. No pleuritic chestpain. Gastrointestinal: Negative for nausea, vomiting, diarrhea, constipation, abdominal pain, melena, blood in stool, hematemesis, jaundice and rectal bleeding.    Genitourinary: Negative for frequency, dysuria, incontinence,flank pain and hematuria; Musculoskeletal: Negative for back pain and neck pain. Negative for swelling and trauma.;  Skin: . Negative for pruritus, rash, abrasions, bruising and skin lesion.; ulcerations Neuro: Negative for headache, lightheadedness and neck stiffness. Negative for weakness, altered level of consciousness , altered mental status, extremity weakness, burning feet, involuntary movement, seizure and syncope.  Psych: negative for anxiety, depression, insomnia, tearfulness, panic attacks, hallucinations, paranoia, suicidal or homicidal ideation    Past Medical History  Diagnosis Date  . History of stress test 2014    intermediate risk  . Hypertension   . Diabetes mellitus without complication    . Hyperlipemia     History reviewed. No pertinent past surgical history.  Medications:  HOME MEDS: Prior to Admission medications   Medication Sig Start Date End Date Taking? Authorizing Provider  albuterol (PROVENTIL HFA;VENTOLIN HFA) 108 (90 BASE) MCG/ACT inhaler Inhale 2 puffs into the lungs every 6 (six) hours as needed for wheezing. 08/15/13  Yes Kathyrn Drown, MD  metFORMIN (GLUCOPHAGE) 500 MG tablet Take 1 tablet (500 mg total) by mouth 2 (two) times daily with a meal. 11/13/14  Yes Kathyrn Drown, MD  aspirin EC 81 MG tablet Take 1 tablet (81 mg total) by mouth daily. Patient not taking: Reported on 05/06/2015 08/15/13   Kathyrn Drown, MD     Allergies:  No Known Allergies  Social History:   reports that he has never smoked. He does not have any smokeless tobacco history on file. He reports that he does not drink alcohol or use illicit drugs.  Family History: Family History  Problem Relation Age of Onset  . Diabetes Father   . Heart disease Mother   . Cancer       Physical Exam: Filed Vitals:   05/06/15 2123 05/07/15 0225 05/07/15 0500 05/07/15 1300  BP:  137/81 142/75 129/64  Pulse: 66 72 72 69  Temp:  98.3 F (36.8 C) 98.7 F (37.1 C) 98 F (36.7 C)  TempSrc:  Oral Oral Oral  Resp:  18 20 20   Height: 6\' 2"  (1.88 m)     Weight: 128.141 kg (282 lb 8 oz)  127.914 kg (282 lb)   SpO2: 96% 99% 98% 96%   Blood pressure 129/64, pulse 69, temperature 98 F (36.7 C), temperature source Oral, resp. rate 20, height 6\' 2"  (1.88 m), weight 127.914 kg (282 lb), SpO2 96 %.  GEN:  Pleasant  patient lying in the stretcher in no acute distress; cooperative with exam. PSYCH:  alert and oriented x4; does not appear anxious or depressed; affect is appropriate. HEENT: Mucous membranes pink and anicteric; PERRLA; EOM intact; no cervical lymphadenopathy nor thyromegaly or carotid bruit; no JVD; There were no stridor. Neck is very supple. Breasts:: Not examined CHEST WALL: No  tenderness CHEST: Normal respiration, clear to auscultation bilaterally.  HEART: Regular rate and rhythm.  There are no murmur, rub, or gallops.   BACK: No kyphosis or scoliosis; no CVA tenderness ABDOMEN: soft and non-tender; no masses, no organomegaly, normal abdominal bowel sounds; no pannus; no intertriginous candida. There is no rebound and no distention. Rectal Exam: Not done EXTREMITIES: No bone or joint deformity; age-appropriate arthropathy of the hands and knees; no edema; no ulcerations.  There is no calf tenderness. Genitalia: not examined PULSES: 2+ and symmetric SKIN: Normal hydration no rash or ulceration CNS: Cranial nerves 2-12 grossly intact no focal lateralizing neurologic deficit.  Speech is fluent; uvula elevated with phonation, facial symmetry and tongue midline. DTR are normal bilaterally, cerebella exam is intact, barbinski is negative and strengths are equaled bilaterally.  No sensory loss.   Labs on Admission:  Basic Metabolic Panel:  Recent Labs Lab 05/06/15 1553 05/07/15 0120  NA 139 139  K 4.1 3.7  CL 102 105  CO2 29 27  GLUCOSE 148* 186*  BUN 22* 21*  CREATININE 1.13 1.03  CALCIUM 9.1 8.6*   Liver Function Tests:  Recent Labs Lab 05/06/15 1553 05/07/15 0120  AST 21 21  ALT 21 22  ALKPHOS 63 59  BILITOT 0.7 0.9  PROT 7.5 6.8  ALBUMIN 4.2 3.8   CBC:  Recent Labs Lab 05/06/15 1553  WBC 7.5  HGB 15.8  HCT 46.9  MCV 90.0  PLT 203   Cardiac Enzymes:  Recent Labs Lab 05/06/15 1553 05/06/15 1925 05/06/15 2152 05/07/15 0120  TROPONINI <0.03 <0.03 <0.03 <0.03    CBG:  Recent Labs Lab 05/06/15 2127 05/07/15 0735 05/07/15 1117  GLUCAP 147* 147* 165*     Radiological Exams on Admission: Ct Angio Chest Pe W/cm &/or Wo Cm  05/06/2015   CLINICAL DATA:  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 Multiplanar CT image reconstructions and MIPs were obtained to evaluate the vascular anatomy.  CONTRAST:  172mL OMNIPAQUE IOHEXOL 350 MG/ML SOLN  COMPARISON:  Current chest radiograph  FINDINGS: Angiographic study: No evidence of a pulmonary embolus. Great vessels are normal in caliber. No aortic dissection or plaque.  Thoracic inlet:  No masses or adenopathy.  Mediastinum and hila: Heart normal in size and configuration. Minor coronary artery calcifications. No mediastinal or hilar masses or pathologically enlarged lymph nodes.  Lungs and pleural: No lung consolidation or edema. Mild atelectasis at both lung bases. 8 mm pleural-based nodule in the left lower lobe, image 43, series 5. 3 mm peripheral nodule in the right lower lobe, image 55, series 5. No other lung nodules. No pleural effusion. No pneumothorax.  Limited upper abdomen: Elevated left hemidiaphragm. No acute findings.  Musculoskeletal: Degenerative disc space narrowing and endplate spurring throughout the thoracic spine. No osteoblastic or osteolytic lesions.  Review of the MIP images confirms the above findings.  IMPRESSION: 1. No evidence of a  pulmonary embolus. 2. No acute findings. 3. Mild subsegmental lung base atelectasis, greater on the left. Elevated left hemidiaphragm. 4. Small pulmonary nodules detailed above. If the patient is at high risk for bronchogenic carcinoma, follow-up chest CT at 3-26months is recommended. If the patient is at low risk for bronchogenic carcinoma, follow-up chest CT at 6-12 months is recommended. This recommendation follows the consensus statement: Guidelines for Management of Small Pulmonary Nodules Detected on CT Scans: A Statement from the [place] as published in Radiology 2005; 237:395-400.   Electronically Signed   By: Lajean Manes M.D.   On: 05/06/2015 18:01   Dg Chest Portable 1  View  05/06/2015   CLINICAL DATA:  Chest pain, shortness of Breath  EXAM: PORTABLE CHEST - 1 VIEW  COMPARISON:  08/15/2013  FINDINGS: Cardiomediastinal silhouette is stable. There is chronic elevation of the left hemidiaphragm. No acute infiltrate or pleural effusion. No pulmonary edema.  IMPRESSION: No active disease.  Chronic elevation of the left hemidiaphragm.   Electronically Signed   By: Lahoma Crocker M.D.   On: 05/06/2015 16:33       Assessment/Plan Present on Admission:  . Pulmonary nodule SOB. HTN.  PLAN:  Will continue with Lisinopril, ASA, and check lipid profile.  Start Fish oil, and follow up with cardiology for consideration of Cath.   He will need a follow up chest CT for pulmonary nodule in 3-6 months.    Other plans as per orders.  Code Status: FULL CODE>    Orvan Falconer, MD. [facility] Pager [phone] 7pm to 7am.  05/07/2015, 3:22 PM

## 2015-05-07 NOTE — Care Management Note (Signed)
Case Management Note  Patient Details  Name: Adam Lee MRN: [id number] Date of Birth: December 30, 1955  Subjective/Objective:                  Pt admitted from home with CP and SOB. Pt lives with his wife and will return home at discharge. Pt is independent with ADL's.   Action/Plan: Anticipate discharge within 24 hours. No CM needs noted.  Expected Discharge Date:   05/08/15               Expected Discharge Plan:  Home/Self Care  In-House Referral:  NA  Discharge planning Services  CM Consult  Post Acute Care Choice:  NA Choice offered to:  NA  DME Arranged:    DME Agency:     HH Arranged:    HH Agency:     Status of Service:  Completed, signed off  Medicare Important Message Given:    Date Medicare IM Given:    Medicare IM give by:    Date Additional Medicare IM Given:    Additional Medicare Important Message give by:     If discussed at [place] of Stay Meetings, dates discussed:    Additional Comments:  Joylene Draft, RN 05/07/2015, 10:38 AM

## 2015-05-07 NOTE — Consult Note (Signed)
Reason for Consult: Shortness of breath Referring Physician: PTH Cardiologist: Dr. Johnsie Cancel Consulting Cardiologist: Dr. Ardyth Man Adam Lee is an 59 y.o. male.  HPI: This is a 60 year old male patient who was previously seen by Dr. Johnsie Cancel in 2014 with dyspnea and atypical chest pain and slight elevation in CPKs. 2-D echo was limited because of size but LV function looked normal with increased pattern of mild LVH and grade 1 diastolic dysfunction. He had an intermediate risk exercise Cardiolite Myoview. Perfusion images suggest scar in the mid to basal anterior septal wall with mild ischemia at the base of the defect. There was also a suggestion of scar in the inferior lateral wall with mild ischemia near the apex. EF 40%. He was managed medically.  Patient now presents with acute dyspnea. 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 He came home and went to bed. He awakened with a smothering sensation and couldn't get his breath. It worsened when he laid down so he came to the ER. His symptoms resolved before he got here. He denied any chest pain, palpitations. He did have some dizziness. He denies any exertional symptoms and was fine at work. He plays softball and has no problems. He lost 60 lbs after his problems in 2014 by walking everyday, but hasn't been doing that anymore and has gained about 14 lbs back.Chest x-ray shows elevated left hemidiaphragm(had in 2014), Ddimer .0.77,CT negative for pulmonary embolus but small pulmonary nodules, troponins negative. EKG normal sinus rhythm with trigeminy. No acute change from EKG from 2014.  He has history of DM Type II well controlled, BP that's up and down-not on meds. No history of Hyperlipidemia, never smoked, mother with pacemaker.  Past Medical History  Diagnosis Date  . History of stress test 2014    intermediate risk  . Hypertension   . Diabetes mellitus  without complication   . Hyperlipemia     History reviewed. No pertinent past surgical history.  Family History  Problem Relation Age of Onset  . Diabetes Father   . Heart disease Mother   . Cancer      Social History:  reports that he has never smoked. He does not have any smokeless tobacco history on file. He reports that he does not drink alcohol or use illicit drugs.  Allergies: No Known Allergies  Medications: Scheduled Meds: . aspirin EC  81 mg Oral Daily  . enoxaparin (LOVENOX) injection  40 mg Subcutaneous Q24H  . insulin aspart  0-9 Units Subcutaneous TID WC  . sodium chloride  3 mL Intravenous Q12H  . sodium chloride  3 mL Intravenous Q12H   Continuous Infusions:  PRN Meds:.sodium chloride, acetaminophen **OR** acetaminophen, acetaminophen, gi cocktail, nitroGLYCERIN, ondansetron (ZOFRAN) IV, oxyCODONE, sodium chloride   Results for orders placed or performed during the hospital encounter of 05/06/15 (from the past 48 hour(s))  APTT     Status: None   Collection Time: 05/06/15  3:53 PM  Result Value Ref Range   aPTT 26 24 - 37 seconds  CBC     Status: None   Collection Time: 05/06/15  3:53 PM  Result Value Ref Range   WBC 7.5 4.0 - 10.5 K/uL   RBC 5.21 4.22 - 5.81 MIL/uL   Hemoglobin 15.8 13.0 - 17.0 g/dL   HCT 46.9 39.0 - 52.0 %   MCV 90.0 78.0 - 100.0 fL   MCH 30.3  26.0 - 34.0 pg   MCHC 33.7 30.0 - 36.0 g/dL   RDW 13.1 11.5 - 15.5 %   Platelets 203 150 - 400 K/uL  Comprehensive metabolic panel     Status: Abnormal   Collection Time: 05/06/15  3:53 PM  Result Value Ref Range   Sodium 139 135 - 145 mmol/L   Potassium 4.1 3.5 - 5.1 mmol/L   Chloride 102 101 - 111 mmol/L   CO2 29 22 - 32 mmol/L   Glucose, Bld 148 (H) 65 - 99 mg/dL   BUN 22 (H) 6 - 20 mg/dL   Creatinine, Ser 1.13 0.61 - 1.24 mg/dL   Calcium 9.1 8.9 - 10.3 mg/dL   Total Protein 7.5 6.5 - 8.1 g/dL   Albumin 4.2 3.5 - 5.0 g/dL   AST 21 15 - 41 U/L   ALT 21 17 - 63 U/L   Alkaline  Phosphatase 63 38 - 126 U/L   Total Bilirubin 0.7 0.3 - 1.2 mg/dL   GFR calc non Af Amer >60 >60 mL/min   GFR calc Af Amer >60 >60 mL/min    Comment: (NOTE) The eGFR has been calculated using the CKD EPI equation. This calculation has not been validated in all clinical situations. eGFR's persistently <60 mL/min signify possible Chronic Kidney Disease.    Anion gap 8 5 - 15  Protime-INR     Status: None   Collection Time: 05/06/15  3:53 PM  Result Value Ref Range   Prothrombin Time 13.6 11.6 - 15.2 seconds   INR 1.02 0.00 - 1.49  Troponin I     Status: None   Collection Time: 05/06/15  3:53 PM  Result Value Ref Range   Troponin I <0.03 <0.031 ng/mL    Comment:        NO INDICATION OF MYOCARDIAL INJURY.   D-dimer, quantitative (not at [facility])     Status: Abnormal   Collection Time: 05/06/15  3:53 PM  Result Value Ref Range   D-Dimer, Quant 0.77 (H) 0.00 - 0.48 ug/mL-FEU    Comment:        AT THE INHOUSE ESTABLISHED CUTOFF VALUE OF 0.48 ug/mL FEU, THIS ASSAY HAS BEEN DOCUMENTED IN THE LITERATURE TO HAVE A SENSITIVITY AND NEGATIVE PREDICTIVE VALUE OF AT LEAST 98 TO 99%.  THE TEST RESULT SHOULD BE CORRELATED WITH AN ASSESSMENT OF THE CLINICAL PROBABILITY OF DVT / VTE.   Brain natriuretic peptide     Status: None   Collection Time: 05/06/15  3:53 PM  Result Value Ref Range   B Natriuretic Peptide 22.0 0.0 - 100.0 pg/mL  Troponin I-serum (0, 3, 6 hours)     Status: None   Collection Time: 05/06/15  7:25 PM  Result Value Ref Range   Troponin I <0.03 <0.031 ng/mL    Comment:        NO INDICATION OF MYOCARDIAL INJURY.   Glucose, capillary     Status: Abnormal   Collection Time: 05/06/15  9:27 PM  Result Value Ref Range   Glucose-Capillary 147 (H) 65 - 99 mg/dL   Comment 1 Notify RN    Comment 2 Document in Chart   Troponin I-serum (0, 3, 6 hours)     Status: None   Collection Time: 05/06/15  9:52 PM  Result Value Ref Range   Troponin I <0.03 <0.031 ng/mL    Comment:         NO INDICATION OF MYOCARDIAL INJURY.   Troponin I-serum (0, 3, 6 hours)  Status: None   Collection Time: 05/07/15  1:20 AM  Result Value Ref Range   Troponin I <0.03 <0.031 ng/mL    Comment:        NO INDICATION OF MYOCARDIAL INJURY.   Comprehensive metabolic panel     Status: Abnormal   Collection Time: 05/07/15  1:20 AM  Result Value Ref Range   Sodium 139 135 - 145 mmol/L   Potassium 3.7 3.5 - 5.1 mmol/L   Chloride 105 101 - 111 mmol/L   CO2 27 22 - 32 mmol/L   Glucose, Bld 186 (H) 65 - 99 mg/dL   BUN 21 (H) 6 - 20 mg/dL   Creatinine, Ser 1.03 0.61 - 1.24 mg/dL   Calcium 8.6 (L) 8.9 - 10.3 mg/dL   Total Protein 6.8 6.5 - 8.1 g/dL   Albumin 3.8 3.5 - 5.0 g/dL   AST 21 15 - 41 U/L   ALT 22 17 - 63 U/L   Alkaline Phosphatase 59 38 - 126 U/L   Total Bilirubin 0.9 0.3 - 1.2 mg/dL   GFR calc non Af Amer >60 >60 mL/min   GFR calc Af Amer >60 >60 mL/min    Comment: (NOTE) The eGFR has been calculated using the CKD EPI equation. This calculation has not been validated in all clinical situations. eGFR's persistently <60 mL/min signify possible Chronic Kidney Disease.    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 Multiplanar CT image reconstructions and MIPs were obtained to evaluate the vascular anatomy.  CONTRAST:  158m OMNIPAQUE IOHEXOL 350 MG/ML SOLN  COMPARISON:  Current chest radiograph  FINDINGS: Angiographic study: No evidence of a pulmonary  embolus. Great vessels are normal in caliber. No aortic dissection or plaque.  Thoracic inlet:  No masses or adenopathy.  Mediastinum and hila: Heart normal in size and configuration. Minor coronary artery calcifications. No mediastinal or hilar masses or pathologically enlarged lymph nodes.  Lungs and pleural: No lung consolidation or edema. Mild atelectasis at both lung bases. 8 mm pleural-based nodule in the left lower lobe, image 43, series 5. 3 mm peripheral nodule in the right lower lobe, image 55, series 5. No other lung nodules. No pleural effusion. No pneumothorax.  Limited upper abdomen: Elevated left hemidiaphragm. No acute findings.  Musculoskeletal: Degenerative disc space narrowing and endplate spurring throughout the thoracic spine. No osteoblastic or osteolytic lesions.  Review of the MIP images confirms the above findings.  IMPRESSION: 1. No evidence of a pulmonary embolus. 2. No acute findings. 3. Mild subsegmental lung base atelectasis, greater on the left. Elevated left hemidiaphragm. 4. Small pulmonary nodules detailed above. If the patient is at high risk for bronchogenic carcinoma, follow-up chest CT at 3-669monthis recommended. If the patient is at low risk for bronchogenic carcinoma, follow-up chest CT at 6-12 months is recommended. This recommendation follows the consensus statement: Guidelines for  Management of Small Pulmonary Nodules Detected on CT Scans: A Statement from the [place] as published in Radiology 2005; 237:395-400.   Electronically Signed   By: Lajean Manes M.D.   On: 05/06/2015 18:01   Dg Chest Portable 1 View  05/06/2015   CLINICAL DATA:  Chest pain, shortness of Breath  EXAM: PORTABLE CHEST - 1 VIEW  COMPARISON:  08/15/2013  FINDINGS: Cardiomediastinal silhouette is stable. There is chronic elevation of the left hemidiaphragm. No acute infiltrate or pleural effusion. No pulmonary edema.  IMPRESSION: No active disease.  Chronic elevation of the left  hemidiaphragm.   Electronically Signed   By: Lahoma Crocker M.D.   On: 05/06/2015 16:33   2-D echo 08/16/13 Study Conclusions  - Study data: This is a technically difficult study. The   patient has very poor acoustic windows, severely limiting   diagnostic interpretation. Consider repeat study with   echocontrast if clinically indicated. - Left ventricle: The cavity size was normal. Wall thickness   was increased in a pattern of mild LVH. Cannot accurately   assess left ventricular systolic function. Based on   limited Doppler images, there is likely grade I diastolic   dysfunction.  IMPRESSION: Abnormal, intermediate risk exercise Cardiolite. No diagnostic ST segment changes or chest pain at maximum work load of 9 METS. Hypertensive response noted. Perfusion imaging suggests scar in the mid to basal anteroseptal wall with mild ischemia at the base of the defect. This is somewhat atypical for the course of the major epicardial vessel. There is also suggestion of scar in the inferolateral wall with mild ischemia near the apex. Findings are concerning for the presence of underlying ischemic heart disease. LVEF is calculated at 40% with wall motion abnormalities described above.     ROS  See HPI Eyes: Negative Ears:Negative for hearing loss, tinnitus Cardiovascular: Negative for chest pain, palpitations,irregular heartbeat,  dyspnea on exertion, near-syncope, and syncope,edema, claudication, cyanosis,.  Respiratory:   Negative for cough, hemoptysis, shortness of breath, sputum production and wheezing.   Endocrine: Negative for cold intolerance and heat intolerance.  Hematologic/Lymphatic: Negative for adenopathy and bleeding problem. Does not bruise/bleed easily.  Musculoskeletal: Negative.   Gastrointestinal: Negative for nausea, vomiting, reflux, abdominal pain, diarrhea, constipation.   Genitourinary: Negative for bladder incontinence, dysuria, flank pain, frequency, hematuria,  hesitancy, nocturia and urgency.  Neurological: Negative.  Allergic/Immunologic: Negative for environmental allergies.  Blood pressure 142/75, pulse 72, temperature 98.7 F (37.1 C), temperature source Oral, resp. rate 20, height 6' 2"  (1.88 m), weight 282 lb (127.914 kg), SpO2 98 %. Physical Exam  PHYSICAL EXAM: Well-nournished, in no acute distress. Neck: No JVD, HJR, Bruit, or thyroid enlargement Lungs: No tachypnea, clear without wheezing, rales, or rhonchi Cardiovascular: RRR, PMI not displaced, heart sounds normal, no murmurs, gallops, bruit, thrill, or heave. Abdomen: BS normal. Soft without organomegaly, masses, lesions or tenderness. Extremities: without cyanosis, clubbing or edema. Good distal pulses bilateral SKin: Warm, no lesions or rashes  Musculoskeletal: No deformities Neuro: no focal signs     Assessment/Plan: Acute Dyspnea/smothering sensation:etiology unclear BP elevated 171/107. Will start ACEI  Frequent PVCs  Pulmonary nodules: needs f/u CT  Diabetes mellitus type 2  Intermediate risk exercise Myoview in 2014 see above: could do another stress myoview to reassess.  Ermalinda Barrios 05/07/2015, 8:01 AM    The patient was seen and examined, and I agree with the assessment and plan as documented above, with modifications as noted below. Pt with aforementioned history who presented with acute  dyspnea. He describes it as being "unable to get a full breath" but denied significant shortness of breath per se. Said he was dizzy as well. Has not had any chest pain or exertional dyspnea and is quite active both at work and [employer]ibr[employer], academic. Also denies pretibial swelling, orthopnea, and PND. Said episode in 2014 was much worse. CT chest shows "minor coronary artery calcifications". Prior echocardiogram was a poor quality study. BNP only 22 and troponins normal. Doubt ischemic event. 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 Agree with initiation of ACEI.  I will arrange for outpatient follow up with me in 1-2 weeks. Will hold off any noninvasive testing at this time (echo, stress test), and reassess in clinic.

## 2015-05-08 DIAGNOSIS — I10 Essential (primary) hypertension: Secondary | ICD-10-CM | POA: Diagnosis not present

## 2015-05-08 DIAGNOSIS — R0602 Shortness of breath: Secondary | ICD-10-CM | POA: Diagnosis not present

## 2015-05-08 DIAGNOSIS — E119 Type 2 diabetes mellitus without complications: Secondary | ICD-10-CM | POA: Diagnosis not present

## 2015-05-08 DIAGNOSIS — R911 Solitary pulmonary nodule: Secondary | ICD-10-CM | POA: Diagnosis not present

## 2015-05-08 LAB — LIPID PANEL
CHOLESTEROL: 162 mg/dL (ref 0–200)
HDL: 44 mg/dL (ref >40)
LDL Cholesterol: 91 mg/dL (ref 0–99)
TRIGLYCERIDES: 137 mg/dL (ref <150)
Total CHOL/HDL Ratio: 3.7 RATIO
VLDL: 27 mg/dL (ref 0–40)

## 2015-05-08 LAB — GLUCOSE, CAPILLARY
GLUCOSE-CAPILLARY: 141 mg/dL — AB (ref 65–99)
Glucose-Capillary: 177 mg/dL — ABNORMAL HIGH (ref 65–99)
Glucose-Capillary: 244 mg/dL — ABNORMAL HIGH (ref 65–99)

## 2015-05-08 MED ORDER — OMEGA-3-ACID ETHYL ESTERS 1 G PO CAPS: 1.0000 g | ORAL_CAPSULE | Freq: Two times a day (BID) | ORAL | Status: DC

## 2015-05-08 MED ORDER — ATORVASTATIN CALCIUM 10 MG PO TABS: 10.0000 mg | ORAL_TABLET | Freq: Every day | ORAL | Status: DC

## 2015-05-08 MED ORDER — NITROGLYCERIN 0.4 MG SL SUBL: 0.4000 mg | SUBLINGUAL_TABLET | SUBLINGUAL | Status: DC | PRN

## 2015-05-08 MED ORDER — LISINOPRIL 10 MG PO TABS: 10.0000 mg | ORAL_TABLET | Freq: Every day | ORAL | Status: DC

## 2015-05-08 NOTE — Discharge Summary (Addendum)
Physician Discharge Summary  Adam Lee WUJ:[id number] DOB: 25-Jan-1956 DOA: 05/06/2015  PCP: Sallee Lange, MD  Admit date: 05/06/2015 Discharge date: 05/08/2015  Time spent: 25 minutes  Recommendations for Outpatient Follow-up:  1. Follow up with Cardiologist for chest pain follow up. 2. Follow up with PCP for chest pain and pulmonary nodule requiring at least follow up CT in 3 months.   Discharge Diagnoses:  Principal Problem:   SOB (shortness of breath) Active Problems:   Type II diabetes mellitus, well controlled   Frequent PVCs   Pulmonary nodule   HTN (hypertension)   Discharge Condition: Improved.   Diet recommendation: Cardiac and carb modified diet.   Filed Weights   05/06/15 1547 05/06/15 2123 05/07/15 0500  Weight: 126.1 kg (278 lb) 128.141 kg (282 lb 8 oz) 127.914 kg (282 lb)    History of present illness:  Patient was admitted for chest pain on August 2nd 2016 by Dr Marily Memos.  As per his H and P: " is a 59 y.o. Male Shortness of breath: Patient states that symptoms started acutely this morning when he awoke this morning and ambulated to the bathroom. States that his shortness of breath and feel like he was being smothered. Denies any actual chest pain. States he feels like he was going to pass out for several seconds when symptoms first started. Symptoms lasted 5 hours. Nothing made symptoms worse. Gradually improved w/ time.  Similar symptoms 1 year ago for which pt underwent cardiac workup including exercise stress test showing intermediate risk results.  Associated w/ some nausea. Denies CP, palpitations, Radiation of symptoms.    Hospital Course: Patient was admitted into the telemetry, and he was ruled out with serial troponins.  He had a CTA of the chest, and there was no pumonary embolism.  There was a pulmonary nodule needing follow up.  Radiology suggested a follow up CT in about 3 months.  He was seen in consultation with cardiology, and was Dr Bronson Ing did  not recommend any non invasive nor catheterization at this time.  He was found to have hypertensive, and was started on Lisinopril at 10mg  per day.  He had no further symptoms, anxious to go home, and is stable for discharge. He will follow up with cardiology for decision on any further work up of his chest pain.  He did have a intermediate risk with his myoview done in 2014.  Will defer further testing or proceed with catherization to cardiology. He was found to have elevated LDL to 91, and was also started on Lipitor 10mg  per day, along with fish oil (Lavaza).   He will follow up with PCP for his pulmonary nodule.  He is made aware of this finding as well.       Consultations:  Cardiology, Dr Bronson Ing.   Discharge Exam: Filed Vitals:   05/08/15 0609  BP: 123/68  Pulse: 52  Temp: 98.1 F (36.7 C)  Resp: 20     Discharge Instructions   Discharge Instructions    Diet - low sodium heart healthy    Complete by:  As directed      Discharge instructions    Complete by:  As directed   You can return to work next Monday, August 8th, 2016 with no restriction.     Increase activity slowly    Complete by:  As directed           Current Discharge Medication List    START taking these medications   Details  lisinopril (PRINIVIL,ZESTRIL) 10 MG tablet Take 1 tablet (10 mg total) by mouth daily. Qty: 30 tablet, Refills: 1    nitroGLYCERIN (NITROSTAT) 0.4 MG SL tablet Place 1 tablet (0.4 mg total) under the tongue every 5 (five) minutes as needed for chest pain. Qty: 100 tablet, Refills: 1    omega-3 acid ethyl esters (LOVAZA) 1 G capsule Take 1 capsule (1 g total) by mouth 2 (two) times daily. Qty: 60 capsule, Refills: 3    Lipitor 10mg  per Hs.  CONTINUE these medications which have NOT CHANGED   Details  albuterol (PROVENTIL HFA;VENTOLIN HFA) 108 (90 BASE) MCG/ACT inhaler Inhale 2 puffs into the lungs every 6 (six) hours as needed for wheezing. Qty: 1 Inhaler, Refills: 2     metFORMIN (GLUCOPHAGE) 500 MG tablet Take 1 tablet (500 mg total) by mouth 2 (two) times daily with a meal. Qty: 60 tablet, Refills: 6    aspirin EC 81 MG tablet Take 1 tablet (81 mg total) by mouth daily. Qty: 30 tablet, Refills: 6       No Known Allergies Follow-up Information    Follow up with Herminio Commons, MD On 05/26/2015.   Specialty:  Cardiology   Why:  at 1:40 pm in the [place] office   Contact information:   [place] [id number] [phone]        The results of significant diagnostics from this hospitalization (including imaging, microbiology, ancillary and laboratory) are listed below for reference.    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 Multiplanar CT image reconstructions and MIPs were obtained to evaluate the vascular anatomy.  CONTRAST:  148mL OMNIPAQUE IOHEXOL 350 MG/ML SOLN  COMPARISON:  Current chest radiograph  FINDINGS: Angiographic study: No evidence of a pulmonary embolus. Great vessels are normal in caliber. No aortic dissection or plaque.  Thoracic inlet:  No masses or adenopathy.  Mediastinum and hila: Heart normal in size and configuration. Minor coronary artery calcifications. No mediastinal or hilar masses or pathologically enlarged lymph nodes.  Lungs and pleural: No lung consolidation or edema. Mild atelectasis at both lung bases. 8 mm pleural-based nodule in the left lower lobe, image 43, series 5. 3 mm peripheral nodule in the right lower lobe, image 55, series 5. No other lung nodules. No pleural effusion. No pneumothorax.  Limited upper abdomen: Elevated left hemidiaphragm. No acute findings.   Musculoskeletal: Degenerative disc space narrowing and endplate spurring throughout the thoracic spine. No osteoblastic or osteolytic lesions.  Review of the MIP images confirms the above findings.  IMPRESSION: 1. No evidence of a pulmonary embolus. 2. No acute findings. 3. Mild subsegmental lung base atelectasis, greater on the left. Elevated left hemidiaphragm. 4. Small pulmonary nodules detailed above. If the patient is at high risk for bronchogenic carcinoma, follow-up chest CT at 3-89months is recommended. If the patient is at low risk for bronchogenic carcinoma, follow-up chest CT at 6-12 months is recommended. This recommendation follows the consensus statement: Guidelines for Management of Small Pulmonary Nodules Detected on CT Scans: A Statement from the [place] as published in Radiology 2005; 237:395-400.   Electronically Signed   By: Lajean Manes  M.D.   On: 05/06/2015 18:01   Dg Chest Portable 1 View  05/06/2015   CLINICAL DATA:  Chest pain, shortness of Breath  EXAM: PORTABLE CHEST - 1 VIEW  COMPARISON:  08/15/2013  FINDINGS: Cardiomediastinal silhouette is stable. There is chronic elevation of the left hemidiaphragm. No acute infiltrate or pleural effusion. No pulmonary edema.  IMPRESSION: No active disease.  Chronic elevation of the left hemidiaphragm.   Electronically Signed   By: Lahoma Crocker M.D.   On: 05/06/2015 16:33    Microbiology: No results found for this or any previous visit (from the past 240 hour(s)).   Labs: Basic Metabolic Panel:  Recent Labs Lab 05/06/15 1553 05/07/15 0120  NA 139 139  K 4.1 3.7  CL 102 105  CO2 29 27  GLUCOSE 148* 186*  BUN 22* 21*  CREATININE 1.13 1.03  CALCIUM 9.1 8.6*   Liver Function Tests:  Recent Labs Lab 05/06/15 1553 05/07/15 0120  AST 21 21  ALT 21 22  ALKPHOS 63 59  BILITOT 0.7 0.9  PROT 7.5 6.8  ALBUMIN 4.2 3.8   No results for input(s): LIPASE, AMYLASE in the last 168 hours. No results for input(s): AMMONIA in  the last 168 hours. CBC:  Recent Labs Lab 05/06/15 1553  WBC 7.5  HGB 15.8  HCT 46.9  MCV 90.0  PLT 203   Cardiac Enzymes:  Recent Labs Lab 05/06/15 1553 05/06/15 1925 05/06/15 2152 05/07/15 0120  TROPONINI <0.03 <0.03 <0.03 <0.03     Recent Labs  05/06/15 1553  BNP 22.0   CBG:  Recent Labs Lab 05/07/15 1605 05/07/15 2127 05/08/15 0737 05/08/15 0809 05/08/15 1128  GLUCAP 140* 139* 244* 177* 141*    Signed:  Elaynah Virginia  [facility] 05/08/2015, 1:27 PM

## 2015-05-08 NOTE — Progress Notes (Signed)
Patient discharged home.  IV removed - WNL.  Reviewed medications and follow up appointments.  Verbalizes understanding, no questions at this time.  Stable to DC, ambulated off unit with assist.

## 2015-05-08 NOTE — Care Management Note (Signed)
Case Management Note  Patient Details  Name: Emily Forse MRN: [id number] Date of Birth: 03/02/1956  Subjective/Objective:                    Action/Plan:   Expected Discharge Date:                  Expected Discharge Plan:  Home/Self Care  In-House Referral:  NA  Discharge planning Services  CM Consult  Post Acute Care Choice:  NA Choice offered to:  NA  DME Arranged:    DME Agency:     HH Arranged:    [place] Agency:     Status of Service:  Completed, signed off  Medicare Important Message Given:    Date Medicare IM Given:    Medicare IM give by:    Date Additional Medicare IM Given:    Additional Medicare Important Message give by:     If discussed at [place] of Stay Meetings, dates discussed:    Additional Comments: Pt discharged home today. No CM needs noted. Christinia Gully Ali Chuk, RN 05/08/2015, 2:43 PM

## 2015-05-09 ENCOUNTER — Telehealth: Payer: Self-pay | Admitting: *Deleted

## 2015-05-09 NOTE — Telephone Encounter (Signed)
Per Dr Nicki Reaper: Please connect with the patient to make sure he has a follow-up appointment within the next 2 weeks. Patient has appt with Dr Nicki Reaper 05/13/15.

## 2015-05-13 ENCOUNTER — Encounter: Payer: Self-pay | Admitting: Family Medicine

## 2015-05-13 ENCOUNTER — Ambulatory Visit (INDEPENDENT_AMBULATORY_CARE_PROVIDER_SITE_OTHER): Payer: BLUE CROSS/BLUE SHIELD | Admitting: Family Medicine

## 2015-05-13 VITALS — BP 118/74 | Ht 74.0 in | Wt 278.0 lb

## 2015-05-13 DIAGNOSIS — I10 Essential (primary) hypertension: Secondary | ICD-10-CM

## 2015-05-13 DIAGNOSIS — E119 Type 2 diabetes mellitus without complications: Secondary | ICD-10-CM

## 2015-05-13 DIAGNOSIS — E785 Hyperlipidemia, unspecified: Secondary | ICD-10-CM | POA: Diagnosis not present

## 2015-05-13 NOTE — Progress Notes (Signed)
   Subjective:    Patient ID: Adam Lee, male    DOB: Jan 05, 1956, 59 y.o.   MRN: [id number]  HPI Patient arrives for follow up from recent hospital stay for dysrhythmia. Patient has follow up with cardiology next week for possible cath.  The patient states that when he got admitted into the hospital he fell heaviness in his chest with the shortness of breath he felt like he was smothering. He stated that if he laid back it was worse. He states he's not been eating as well as he should be. He denies any smoking. Does not use alcohol. 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 Apparently he denies any sweats or severe diaphoresis with his work but he does not really do any type of aerobic activity.  The patient does in fact have risk factors for heart disease including lipid, diabetes, age, family history, gender Review of Systems  Constitutional: Negative for activity change, appetite change and fatigue.  HENT: Negative for congestion.   Respiratory: Negative for cough.   Cardiovascular: Negative for chest pain.  Gastrointestinal: Negative for abdominal pain.  Endocrine: Negative for polydipsia and polyphagia.  Neurological: Negative for weakness.  Psychiatric/Behavioral: Negative for confusion.       Objective:   Physical Exam  Constitutional: He appears well-nourished. No distress.  Cardiovascular: Normal rate, regular rhythm and normal heart sounds.   No murmur heard. Pulmonary/Chest: Effort normal and breath sounds normal. No respiratory distress.  Musculoskeletal: He exhibits no edema.  Lymphadenopathy:    He has no cervical adenopathy.  Neurological: He is alert.  Psychiatric: His behavior is normal.  Vitals reviewed.         Assessment & Plan:  The patient had a CT scan  which did show a small pulmonary nodule I would recommend to repeat this again in approximately 4 months in December.  Diabetes subpar control he needs to get better with his diet exercise more often and continue his medications in December we will be checking lab work again  Hyperlipidemia recently started on Lipitor he should continue this we will repeat lab work later into the fall.  Blood pressure overall stable. It should be noted that he had a echo and a stress test with imaging a couple years ago but this did show some question of ischemia.  The patient's BNP in the hospital was normal which points against CHF. It is possible that the patient was having atypical angina with potential change of symptoms related to long-standing diabetes. The patient is planning to see the cardiologist next week. Hopefully at that point in time they will evaluate him further. They may even consider cardiac catheterization. Certainly the patient has risk factors

## 2015-05-26 ENCOUNTER — Ambulatory Visit (INDEPENDENT_AMBULATORY_CARE_PROVIDER_SITE_OTHER): Payer: BLUE CROSS/BLUE SHIELD | Admitting: Cardiovascular Disease

## 2015-05-26 ENCOUNTER — Encounter: Payer: Self-pay | Admitting: Cardiovascular Disease

## 2015-05-26 VITALS — BP 122/92 | HR 72 | Ht 74.5 in | Wt 274.0 lb

## 2015-05-26 DIAGNOSIS — Z87898 Personal history of other specified conditions: Secondary | ICD-10-CM | POA: Diagnosis not present

## 2015-05-26 DIAGNOSIS — R0600 Dyspnea, unspecified: Secondary | ICD-10-CM

## 2015-05-26 DIAGNOSIS — I10 Essential (primary) hypertension: Secondary | ICD-10-CM | POA: Diagnosis not present

## 2015-05-26 DIAGNOSIS — Z9289 Personal history of other medical treatment: Secondary | ICD-10-CM

## 2015-05-26 MED ORDER — LISINOPRIL 10 MG PO TABS: 10.0000 mg | ORAL_TABLET | Freq: Every day | ORAL | Status: DC

## 2015-05-26 MED ORDER — ATORVASTATIN CALCIUM 10 MG PO TABS: 10.0000 mg | ORAL_TABLET | Freq: Every day | ORAL | Status: DC

## 2015-05-26 NOTE — Addendum Note (Signed)
Addended byDebbora Lacrosse R on: 05/26/2015 02:07 PM   Modules accepted: Orders

## 2015-05-26 NOTE — Addendum Note (Signed)
Addended by: Barbarann Ehlers A on: 05/26/2015 02:04 PM   Modules accepted: Orders

## 2015-05-26 NOTE — Patient Instructions (Signed)
Your physician recommends that you schedule a follow-up appointment in:  In November    Your physician recommends that you continue on your current medications as directed. Please refer to the Current Medication list given to you today.    Thank you for choosing [facility] !

## 2015-05-26 NOTE — Progress Notes (Signed)
Patient ID: Adam Lee, male   DOB: 1956-06-03, 59 y.o.   MRN: [id number]      SUBJECTIVE: The patient presents for posthospitalization follow-up. I evaluated him for acute dyspnea on 8/3. He has a prior history of dyspnea with atypical chest pain and previously underwent an intermediate risk exercise Cardiolite stress test with perfusion imaging suggestive of scar in the mid to basal anterior septal wall with mild ischemia at the base of the defect. There was also a suggestion of scar in the inferolateral wall with mild ischemia near the apex. Please refer to my consultation note on 8/3 for further details. CT showed "minor coronary artery calcifications". BNP was only 22 and troponins were normal. An ACE inhibitor was started as he had been experiencing systolic blood pressures consistently over 150 mmHg at work.   He has been feeling well and denies chest pain, palpitations, leg swelling, dizziness, and shortness of breath. His blood pressure has remained controlled. He has lost 10 pounds intentionally since his hospitalization and is eating better.   Review of Systems: As per "subjective", otherwise negative.  No Known Allergies  Current Outpatient Prescriptions  Medication Sig Dispense Refill  . albuterol (PROVENTIL HFA;VENTOLIN HFA) 108 (90 BASE) MCG/ACT inhaler Inhale 2 puffs into the lungs every 6 (six) hours as needed for wheezing. 1 Inhaler 2  . aspirin EC 81 MG tablet Take 1 tablet (81 mg total) by mouth daily. 30 tablet 6  . atorvastatin (LIPITOR) 10 MG tablet Take 1 tablet (10 mg total) by mouth daily. 30 tablet 1  . lisinopril (PRINIVIL,ZESTRIL) 10 MG tablet Take 1 tablet (10 mg total) by mouth daily. 30 tablet 1  . metFORMIN (GLUCOPHAGE) 500 MG tablet Take 1 tablet (500 mg total) by mouth 2 (two) times daily with a meal. 60 tablet 6  . nitroGLYCERIN (NITROSTAT) 0.4 MG SL tablet Place 1 tablet (0.4 mg total) under the tongue every 5 (five) minutes as needed for chest pain. 100  tablet 1  . omega-3 acid ethyl esters (LOVAZA) 1 G capsule Take 1 capsule (1 g total) by mouth 2 (two) times daily. 60 capsule 3   No current facility-administered medications for this visit.    Past Medical History  Diagnosis Date  . History of stress test 2014    intermediate risk  . Hypertension   . Diabetes mellitus without complication   . Hyperlipemia     No past surgical history on file.  Social History   Social History  . Marital Status: Married    Spouse Name: N/A  . Number of Children: N/A  . Years of Education: N/A   Occupational History  . Not on file.   Social History Main Topics  . Smoking status: Never Smoker   . Smokeless tobacco: Not on file  . Alcohol Use: No  . Drug Use: No  . Sexual Activity: Not on file   Other Topics Concern  . Not on file   Social History Narrative     Filed Vitals:   05/26/15 1333  BP: 122/92  Pulse: 72  Height: 6' 2.5" (1.892 m)  Weight: 274 lb (124.286 kg)  SpO2: 96%    PHYSICAL EXAM General: NAD HEENT: Normal. Neck: No JVD, no thyromegaly. Lungs: Clear to auscultation bilaterally with normal respiratory effort. CV: Nondisplaced PMI.  Regular rate and rhythm, normal S1/S2, no S3/S4, no murmur. No pretibial or periankle edema.  No carotid bruit.  Normal pedal pulses.  Abdomen: Soft, nontender, no hepatosplenomegaly, no distention.  Neurologic: Alert and oriented x 3.  Psych: Normal affect. Skin: Normal. Musculoskeletal: Normal range of motion, no gross deformities. Extremities: No clubbing or cyanosis.   ECG: Most recent ECG reviewed.      ASSESSMENT AND PLAN: 1. Shortness of breath: No recurrences. BP much better controlled. Will hold off on noninvasive testing at this time and reassess at next visit. I have told him should his symptoms recur, to come and see me sooner.  2. Essential HTN: Mildly elevated DBP with recent initiation of ACEI. No changes for now. Weight loss encouraged.  Dispo: f/u  November.   Kate Sable, M.D., F.A.C.C.

## 2015-08-27 ENCOUNTER — Ambulatory Visit (INDEPENDENT_AMBULATORY_CARE_PROVIDER_SITE_OTHER): Payer: BLUE CROSS/BLUE SHIELD | Admitting: Cardiovascular Disease

## 2015-08-27 ENCOUNTER — Encounter: Payer: Self-pay | Admitting: Cardiovascular Disease

## 2015-08-27 VITALS — BP 112/78 | HR 81 | Ht 74.5 in | Wt 274.0 lb

## 2015-08-27 DIAGNOSIS — I10 Essential (primary) hypertension: Secondary | ICD-10-CM

## 2015-08-27 DIAGNOSIS — R9439 Abnormal result of other cardiovascular function study: Secondary | ICD-10-CM

## 2015-08-27 DIAGNOSIS — R931 Abnormal findings on diagnostic imaging of heart and coronary circulation: Secondary | ICD-10-CM | POA: Diagnosis not present

## 2015-08-27 DIAGNOSIS — R0600 Dyspnea, unspecified: Secondary | ICD-10-CM | POA: Diagnosis not present

## 2015-08-27 NOTE — Progress Notes (Signed)
Patient ID: Adam Lee, male   DOB: 1956/05/06, 59 y.o.   MRN: [id number]      SUBJECTIVE: The patient presents for follow-up of shortness of breath and hypertension. I evaluated him for acute dyspnea on 05/07/15. He has a prior history of dyspnea with atypical chest pain and underwent an intermediate risk exercise Cardiolite stress test on 08/23/13 with perfusion imaging suggestive of scar in the mid to basal anterior septal wall with mild ischemia at the base of the defect. There was also a suggestion of scar in the inferolateral wall with mild ischemia near the apex. Please refer to my consultation note on 05/07/15 for further details. CT showed "minor coronary artery calcifications". BNP was only 22 and troponins were normal. An ACE inhibitor was started as he had been experiencing systolic blood pressures consistently over 150 mmHg at work.  He continues to do well and denies chest pain and shortness of breath. He remains very active at work. He is here with his wife, Juliann Pulse.   Review of Systems: As per "subjective", otherwise negative.  No Known Allergies  Current Outpatient Prescriptions  Medication Sig Dispense Refill  . aspirin EC 81 MG tablet Take 1 tablet (81 mg total) by mouth daily. 30 tablet 6  . atorvastatin (LIPITOR) 10 MG tablet Take 1 tablet (10 mg total) by mouth daily. 90 tablet 1  . lisinopril (PRINIVIL,ZESTRIL) 10 MG tablet Take 1 tablet (10 mg total) by mouth daily. 90 tablet 3  . metFORMIN (GLUCOPHAGE) 500 MG tablet Take 1 tablet (500 mg total) by mouth 2 (two) times daily with a meal. 60 tablet 6  . nitroGLYCERIN (NITROSTAT) 0.4 MG SL tablet Place 1 tablet (0.4 mg total) under the tongue every 5 (five) minutes as needed for chest pain. 100 tablet 1  . omega-3 acid ethyl esters (LOVAZA) 1 G capsule Take 1 capsule (1 g total) by mouth 2 (two) times daily. 60 capsule 3   No current facility-administered medications for this visit.    Past Medical History  Diagnosis Date    . History of stress test 2014    intermediate risk  . Hypertension   . Diabetes mellitus without complication ([place])   . Hyperlipemia     No past surgical history on file.  Social History   Social History  . Marital Status: Married    Spouse Name: N/A  . Number of Children: N/A  . Years of Education: N/A   Occupational History  . Not on file.   Social History Main Topics  . Smoking status: Never Smoker   . Smokeless tobacco: Not on file  . Alcohol Use: No  . Drug Use: No  . Sexual Activity: Not on file   Other Topics Concern  . Not on file   Social History Narrative     Filed Vitals:   08/27/15 1545  BP: 112/78  Pulse: 81  Height: 6' 2.5" (1.892 m)  Weight: 274 lb (124.286 kg)  SpO2: 95%    PHYSICAL EXAM General: NAD HEENT: Normal. Neck: No JVD, no thyromegaly. Lungs: Clear to auscultation bilaterally with normal respiratory effort. CV: Nondisplaced PMI.  Regular rate and rhythm, normal S1/S2, no S3/S4, no murmur. No pretibial or periankle edema.  No carotid bruit.    Abdomen: Soft, nontender, no distention.  Neurologic: Alert and oriented x 3.  Psych: Normal affect. Skin: Normal. Musculoskeletal: No gross deformities. Extremities: No clubbing or cyanosis.   ECG: Most recent ECG reviewed.      ASSESSMENT  AND PLAN: 1. Shortness of breath with previously abnormal nuclear stress test: No recurrences. BP well controlled. Will hold off on noninvasive testing at this time. I have told him should his symptoms recur, to come and see me sooner.  2. Essential HTN: Controlled. No changes.  Dispo: f/u 1 year.   Kate Sable, M.D., F.A.C.C.

## 2015-08-27 NOTE — Patient Instructions (Signed)
Medication Instructions: Your physician recommends that you continue on your current medications as directed. Please refer to the Current Medication list given to you today.   Labwork: NONE  Testing/Procedures: NONE  Follow-Up: Your physician wants you to follow-up in: 1 YEAR WITH DR. KONESWARAN.  You will receive a reminder letter in the mail two months in advance. If you don't receive a letter, please call our office to schedule the follow-up appointment.   Any Other Special Instructions Will Be Listed Below (If Applicable).     If you need a refill on your cardiac medications before your next appointment, please call your pharmacy. Thanks for choosing [facility]!!!     

## 2015-09-01 ENCOUNTER — Other Ambulatory Visit: Payer: Self-pay | Admitting: Family Medicine

## 2015-09-01 ENCOUNTER — Other Ambulatory Visit: Payer: Self-pay | Admitting: Cardiovascular Disease

## 2015-09-24 ENCOUNTER — Telehealth: Payer: Self-pay | Admitting: *Deleted

## 2015-09-24 ENCOUNTER — Other Ambulatory Visit: Payer: Self-pay | Admitting: *Deleted

## 2015-09-24 DIAGNOSIS — R911 Solitary pulmonary nodule: Secondary | ICD-10-CM

## 2015-09-24 DIAGNOSIS — E119 Type 2 diabetes mellitus without complications: Secondary | ICD-10-CM

## 2015-09-24 DIAGNOSIS — E785 Hyperlipidemia, unspecified: Secondary | ICD-10-CM

## 2015-09-24 DIAGNOSIS — I10 Essential (primary) hypertension: Secondary | ICD-10-CM

## 2015-09-24 DIAGNOSIS — Z79899 Other long term (current) drug therapy: Secondary | ICD-10-CM

## 2015-09-24 NOTE — Telephone Encounter (Signed)
Pt in [place] file for ct scan, bw and follow up ov in December after test are done. Orders put in for bw and scan. Need to schedule scan after asking pt when he can go. [facility].

## 2015-10-01 ENCOUNTER — Other Ambulatory Visit: Payer: Self-pay | Admitting: Family Medicine

## 2015-10-01 NOTE — Telephone Encounter (Signed)
[facility] 12/28

## 2015-10-01 NOTE — Telephone Encounter (Signed)
Last seen for medication check February 2016. May we refill?

## 2015-10-01 NOTE — Telephone Encounter (Signed)
Magan give this but no additional refills send card to patient needs office visit

## 2015-10-14 NOTE — Telephone Encounter (Signed)
Spoke with patient and informed patient that appointment for CT scan is January 12th at 3:00 pm. Also informed patient labs were ordered and to use [facility]. Patient verbalized understanding.

## 2015-10-16 ENCOUNTER — Ambulatory Visit (HOSPITAL_COMMUNITY)
Admission: RE | Admit: 2015-10-16 | Discharge: 2015-10-16 | Disposition: A | Discharge status: Home / Self Care | Payer: BLUE CROSS/BLUE SHIELD | Source: Ambulatory Visit | Attending: Family Medicine | Admitting: Family Medicine

## 2015-10-16 DIAGNOSIS — R918 Other nonspecific abnormal finding of lung field: Secondary | ICD-10-CM | POA: Diagnosis present

## 2015-10-17 ENCOUNTER — Telehealth: Payer: Self-pay | Admitting: Family Medicine

## 2015-10-17 LAB — BASIC METABOLIC PANEL
BUN / CREAT RATIO: 22 (ref 10–22)
BUN: 22 mg/dL (ref 8–27)
CHLORIDE: 100 mmol/L (ref 96–106)
CO2: 26 mmol/L (ref 18–29)
Calcium: 9.6 mg/dL (ref 8.6–10.2)
Creatinine, Ser: 1.01 mg/dL (ref 0.76–1.27)
GFR calc Af Amer: 92 mL/min/{1.73_m2} (ref >59)
GFR calc non Af Amer: 80 mL/min/{1.73_m2} (ref >59)
GLUCOSE: 132 mg/dL — AB (ref 65–99)
POTASSIUM: 4.3 mmol/L (ref 3.5–5.2)
SODIUM: 140 mmol/L (ref 134–144)

## 2015-10-17 LAB — HEPATIC FUNCTION PANEL
ALT: 20 IU/L (ref 0–44)
AST: 21 IU/L (ref 0–40)
Albumin: 4.2 g/dL (ref 3.6–4.8)
Alkaline Phosphatase: 59 IU/L (ref 39–117)
BILIRUBIN, DIRECT: 0.14 mg/dL (ref 0.00–0.40)
Bilirubin Total: 0.4 mg/dL (ref 0.0–1.2)
Total Protein: 6.6 g/dL (ref 6.0–8.5)

## 2015-10-17 LAB — HEMOGLOBIN A1C
Est. average glucose Bld gHb Est-mCnc: 157 mg/dL
HEMOGLOBIN A1C: 7.1 % — AB (ref 4.8–5.6)

## 2015-10-17 LAB — LIPID PANEL
CHOL/HDL RATIO: 2.1 ratio (ref 0.0–5.0)
Cholesterol, Total: 121 mg/dL (ref 100–199)
HDL: 57 mg/dL (ref >39)
LDL Calculated: 51 mg/dL (ref 0–99)
TRIGLYCERIDES: 64 mg/dL (ref 0–149)
VLDL Cholesterol Cal: 13 mg/dL (ref 5–40)

## 2015-10-17 NOTE — Telephone Encounter (Signed)
Patient called requesting refill on Omega 3 fish oil. Medication was last prescribed by another MD. Patient stated he no longer see's the MD and would like you to take over refills. Please advise?

## 2015-10-17 NOTE — Telephone Encounter (Signed)
May give refill and 5  Additional refills

## 2015-10-18 ENCOUNTER — Encounter: Payer: Self-pay | Admitting: Family Medicine

## 2015-10-20 MED ORDER — OMEGA-3-ACID ETHYL ESTERS 1 G PO CAPS: 1.0000 g | ORAL_CAPSULE | Freq: Two times a day (BID) | ORAL | Status: DC

## 2015-10-20 NOTE — Telephone Encounter (Signed)
Rx sent electronically to pharmacy. Patient notified. 

## 2015-10-20 NOTE — Addendum Note (Signed)
Addended by: Dairl Ponder on: 10/20/2015 09:01 AM   Modules accepted: Orders

## 2015-11-04 ENCOUNTER — Other Ambulatory Visit: Payer: Self-pay | Admitting: Family Medicine

## 2015-11-20 ENCOUNTER — Ambulatory Visit (INDEPENDENT_AMBULATORY_CARE_PROVIDER_SITE_OTHER): Payer: BLUE CROSS/BLUE SHIELD | Admitting: Family Medicine

## 2015-11-20 ENCOUNTER — Encounter: Payer: Self-pay | Admitting: Family Medicine

## 2015-11-20 VITALS — BP 110/80 | Ht 74.0 in | Wt 283.2 lb

## 2015-11-20 DIAGNOSIS — E785 Hyperlipidemia, unspecified: Secondary | ICD-10-CM

## 2015-11-20 DIAGNOSIS — E119 Type 2 diabetes mellitus without complications: Secondary | ICD-10-CM

## 2015-11-20 DIAGNOSIS — I10 Essential (primary) hypertension: Secondary | ICD-10-CM

## 2015-11-20 NOTE — Patient Instructions (Signed)
Metformin 500 mg 1.5 tablets twice a day Call if diarrhea Recheck in 6 months

## 2015-11-20 NOTE — Progress Notes (Signed)
   Subjective:    Patient ID: Adam Lee, male    DOB: [birth year], 60 y.o.   MRN: [id number]  Hypertension This is a chronic problem. The current episode started more than 1 year ago. There are no compliance problems.    Patient states no other concerns this visit.  denies any low sugar spells states taking medicine on regular basis watching diet staying physically active. Denies any chest pressure tightness denies shortness of breath.  Review of Systems  patient denies any chest tightness pressure pain shortness breath nausea vomiting diarrhea denies excessive thirst or urination    Objective:   Physical Exam   eyes appear normal eardrums normal throat normal neck no masses lungs clear heart regular abdomen soft      Assessment & Plan:   hyperlipidemia continue current measures Hypertension good control continue current measures Diabetes fair control increase metformin one half tablet twice daily  patient to continue 81 mg aspirin Follow-up 6 months   ophthalmology about UA showed recommended on a yearly basis

## 2015-11-25 ENCOUNTER — Other Ambulatory Visit: Payer: Self-pay | Admitting: *Deleted

## 2015-11-25 MED ORDER — OMEGA-3-ACID ETHYL ESTERS 1 G PO CAPS: 1.0000 g | ORAL_CAPSULE | Freq: Two times a day (BID) | ORAL | Status: DC

## 2015-12-01 ENCOUNTER — Other Ambulatory Visit: Payer: Self-pay | Admitting: Family Medicine

## 2016-04-19 ENCOUNTER — Other Ambulatory Visit: Payer: Self-pay | Admitting: *Deleted

## 2016-04-19 MED ORDER — METFORMIN HCL 500 MG PO TABS: ORAL_TABLET | ORAL | Status: DC

## 2016-04-20 ENCOUNTER — Telehealth: Payer: Self-pay | Admitting: Family Medicine

## 2016-04-20 MED ORDER — METFORMIN HCL 500 MG PO TABS: ORAL_TABLET | ORAL | Status: DC

## 2016-04-20 NOTE — Telephone Encounter (Signed)
Left message on voicemail notifying patient med sent to pharmacy.  

## 2016-04-20 NOTE — Telephone Encounter (Signed)
metFORMIN (GLUCOPHAGE) 500 MG tablet   Pt thought he didn't have to return until sept, he will make an appt If we can send him in one month to get him through.   [facility] (they say they have not got this refill)

## 2016-04-22 ENCOUNTER — Other Ambulatory Visit: Payer: Self-pay

## 2016-04-22 MED ORDER — METFORMIN HCL 500 MG PO TABS: ORAL_TABLET | ORAL | Status: DC

## 2016-05-19 ENCOUNTER — Ambulatory Visit: Payer: BLUE CROSS/BLUE SHIELD | Admitting: Family Medicine

## 2016-06-01 ENCOUNTER — Ambulatory Visit (INDEPENDENT_AMBULATORY_CARE_PROVIDER_SITE_OTHER): Payer: Self-pay | Admitting: Family Medicine

## 2016-06-01 ENCOUNTER — Encounter: Payer: Self-pay | Admitting: Family Medicine

## 2016-06-01 VITALS — BP 122/80 | Ht 74.0 in | Wt 290.2 lb

## 2016-06-01 DIAGNOSIS — Z125 Encounter for screening for malignant neoplasm of prostate: Secondary | ICD-10-CM

## 2016-06-01 DIAGNOSIS — I10 Essential (primary) hypertension: Secondary | ICD-10-CM

## 2016-06-01 DIAGNOSIS — E119 Type 2 diabetes mellitus without complications: Secondary | ICD-10-CM

## 2016-06-01 DIAGNOSIS — Z79899 Other long term (current) drug therapy: Secondary | ICD-10-CM

## 2016-06-01 DIAGNOSIS — Z13220 Encounter for screening for lipoid disorders: Secondary | ICD-10-CM

## 2016-06-01 LAB — POCT GLYCOSYLATED HEMOGLOBIN (HGB A1C): Hemoglobin A1C: 7.2

## 2016-06-01 MED ORDER — ATORVASTATIN CALCIUM 10 MG PO TABS: 10.0000 mg | ORAL_TABLET | Freq: Every day | ORAL | 3 refills | Status: DC

## 2016-06-01 MED ORDER — METFORMIN HCL 500 MG PO TABS: ORAL_TABLET | ORAL | 1 refills | Status: DC

## 2016-06-01 MED ORDER — LISINOPRIL 10 MG PO TABS: 10.0000 mg | ORAL_TABLET | Freq: Every day | ORAL | 3 refills | Status: DC

## 2016-06-01 NOTE — Progress Notes (Signed)
   Subjective:    Patient ID: Adam Lee, male    DOB: 16-Feb-1956, 60 y.o.   MRN: [id number]  Diabetes  He presents for his follow-up diabetic visit. He has type 2 diabetes mellitus. Pertinent negatives for hypoglycemia include no confusion. Pertinent negatives for diabetes include no chest pain, no fatigue, no polydipsia, no polyphagia and no weakness. He has not had a previous visit with a dietitian. He does not see a podiatrist.Eye exam is not current.   Patient has c/o cough, and some dizziness when first getting up, and fatigue. Patient states at times when he stands up he feels a little bit dizzy does not pass out no presyncope does not black out Patient states he was doing a good job watching diet but no longer watch his diet he is not exercising he denies any chest tightness pressure pain Patient does state he takes his aspirin every day Takes his diabetes medicine Takes blood pressure medicine watch his salt in the diet Takes his cholesterol medicine.  Review of Systems  Constitutional: Negative for activity change, appetite change, fatigue and fever.  HENT: Negative for congestion, ear pain and rhinorrhea.   Eyes: Negative for discharge.  Respiratory: Negative for cough and wheezing.   Cardiovascular: Negative for chest pain.  Gastrointestinal: Negative for abdominal pain.  Endocrine: Negative for polydipsia and polyphagia.  Neurological: Negative for weakness.  Psychiatric/Behavioral: Negative for confusion.       Objective:   Physical Exam  Constitutional: He appears well-nourished. No distress.  Cardiovascular: Normal rate, regular rhythm and normal heart sounds.   No murmur heard. Pulmonary/Chest: Effort normal and breath sounds normal. No respiratory distress.  Musculoskeletal: He exhibits no edema.  Lymphadenopathy:    He has no cervical adenopathy.  Neurological: He is alert.  Psychiatric: His behavior is normal.  Vitals reviewed.         Assessment &  Plan:  Hyperlipidemia continue medication check lipid liver profile HTN good control continue current meds Diabetes fair control encourage him to do better job watching diet and encourage him start doing exercise  Mild allergy related issue versus the possibility of this being a cough related to lisinopril. Patient does not want to change medications currently because lisinopril it's cheap in the replacement would not be  Patient will look into getting a colonoscopy at [facility] I believe that is where he would get his best price  Await lab work

## 2016-06-01 NOTE — Patient Instructions (Signed)
Dear Patient,  It has been recommended to you that you have a colonoscopy. It is your responsibility to carry through with this recommendation.   Did you realize that colon cancer is the second leading cancer killer in the [place]. One in every 20 adults will get colon cancer. If all adults would go through the recommended screening for colon cancer (getting a colonoscopy), then there would be a 60% reduction in the number of people dying from colon cancer.  Colon cancer just doesn't come out of the blue. It starts off as a small polyp which over time grows into a cancer. A colonoscopy can prevent cancer and in many cases detected when it is at a very treatable phase. Small colon cancers can have cure rates of 95%. Advanced colon cancer, which often occurs in people who do not do their screenings, have cure rates less than 20%. The risk of colon cancer advances with age. Most adults should have regular colonoscopies every 10 years starting at age 50. This recommendation can vary depending on a person's medical history.  Health-care laws now allow for you to call the gastroenterologist office directly in order to set yourself up for this very important tests. Today we have recommended to you that you do this test. This test may save your life. Failure to do this test puts you at risk for premature death from colon cancer. Do the right thing and schedule this test now.  Here as a list of specialists we recommend in the surrounding area. When you call their office let them know that you are a patient of our practice in your interested in doing a screening colonoscopy. They should assist you without problems. You will need the following information when you called them: 1-name of which Dr. you see, 2-your insurance information, 3-a list of medications that you currently take, 4-any allergies you have to medications.  [place] gastroenterologist Dr. Mike Rourk, Dr Sandi Fields   [place]  gastroenterologist   [phone]  Dr.Najeeb Rehman [facility]   [phone]  [place] [facility] (Dr. Perry, N, Stark, Brodie, Gesner, Jacobs and Pyrtle) [phone]  [facility] (Dr. Buscemi, Edwards, Hayes, Maygod,Outlaw,Schooler) [phone]  Each group of specialists has assured us that when you called them they will help you get your colonoscopy set up. Should you have problems or if the GI practice insist a referral be done please let us know. Be sure to call soon. Sincerely, Carolyn Hoskins, Dr Steve Luking, Dr.Scott Luking    

## 2016-06-08 ENCOUNTER — Other Ambulatory Visit: Payer: Self-pay | Admitting: Family Medicine

## 2016-08-14 ENCOUNTER — Other Ambulatory Visit: Payer: Self-pay | Admitting: Cardiovascular Disease

## 2016-09-02 ENCOUNTER — Encounter: Payer: Self-pay | Admitting: Family Medicine

## 2016-09-02 ENCOUNTER — Ambulatory Visit (INDEPENDENT_AMBULATORY_CARE_PROVIDER_SITE_OTHER): Payer: Self-pay | Admitting: Family Medicine

## 2016-09-02 VITALS — BP 118/70 | Temp 98.7°F | Ht 74.0 in | Wt 295.0 lb

## 2016-09-02 DIAGNOSIS — B9689 Other specified bacterial agents as the cause of diseases classified elsewhere: Secondary | ICD-10-CM

## 2016-09-02 DIAGNOSIS — J019 Acute sinusitis, unspecified: Secondary | ICD-10-CM

## 2016-09-02 MED ORDER — AMOXICILLIN 500 MG PO TABS: 500.0000 mg | ORAL_TABLET | Freq: Three times a day (TID) | ORAL | 0 refills | Status: DC

## 2016-09-02 NOTE — Progress Notes (Signed)
   Subjective:    Patient ID: Adam Lee, male    DOB: Jun 13, 1956, 60 y.o.   MRN: [id number]  Sinusitis  This is a new problem. Episode onset: one week ago. Associated symptoms include congestion, coughing and a sore throat. (Wheezing)  Patient relates start off with head congestion sinus pressure and drainage then started in the chest congestion as well he denies high fever chills sweats he states at times he felt a little tight in his bronchioles but denies any true wheezing denies being short of breath denies sweats    Review of Systems  HENT: Positive for congestion and sore throat.   Respiratory: Positive for cough.        Objective:   Physical Exam  Lungs are clear hearts regular HEENT is benign throat normal ears normal minimal sinus tenderness      Assessment & Plan:  Viral syndrome Secondary rhinosinusitis Bronchitis related to virus Should gradually get better Warning signs discuss no x-rays lab work indicated. Call if ongoing issues or problems

## 2016-09-13 ENCOUNTER — Encounter: Payer: Self-pay | Admitting: Cardiovascular Disease

## 2016-09-13 ENCOUNTER — Ambulatory Visit (INDEPENDENT_AMBULATORY_CARE_PROVIDER_SITE_OTHER): Payer: Self-pay | Admitting: Cardiovascular Disease

## 2016-09-13 VITALS — BP 122/82 | HR 92 | Ht 74.0 in | Wt 293.0 lb

## 2016-09-13 DIAGNOSIS — I10 Essential (primary) hypertension: Secondary | ICD-10-CM

## 2016-09-13 DIAGNOSIS — Z7182 Exercise counseling: Secondary | ICD-10-CM

## 2016-09-13 DIAGNOSIS — R0609 Other forms of dyspnea: Secondary | ICD-10-CM

## 2016-09-13 DIAGNOSIS — Z713 Dietary counseling and surveillance: Secondary | ICD-10-CM

## 2016-09-13 DIAGNOSIS — R9439 Abnormal result of other cardiovascular function study: Secondary | ICD-10-CM

## 2016-09-13 DIAGNOSIS — R0600 Dyspnea, unspecified: Secondary | ICD-10-CM

## 2016-09-13 NOTE — Patient Instructions (Signed)
Your physician wants you to follow-up in: 1 year Dr Koneswaran You will receive a reminder letter in the mail two months in advance. If you don't receive a letter, please call our office to schedule the follow-up appointment.     Your physician recommends that you continue on your current medications as directed. Please refer to the Current Medication list given to you today.    If you need a refill on your cardiac medications before your next appointment, please call your pharmacy.      Thank you for choosing [facility] !         

## 2016-09-13 NOTE — Progress Notes (Signed)
SUBJECTIVE: The patient presents for follow-up of shortness of breath and hypertension. I evaluated him for acute dyspnea on 05/07/15. He has a prior history of dyspnea with atypical chest pain and underwent an intermediate risk exercise Cardiolite stress test on 08/23/13 with perfusion imaging suggestive of scar in the mid to basal anterior septal wall with mild ischemia at the base of the defect. There was also a suggestion of scar in the inferolateral wall with mild ischemia near the apex. Please refer to my consultation note on 05/07/15 for further details. CT showed "minor coronary artery calcifications". BNP was only 22 and troponins were normal. An ACE inhibitor was started as he had been experiencing systolic blood pressures consistently over 150 mmHg at work.  He continues to do well and denies chest pain. Over the past few weeks he has been more fatigued with exertion with some mild exertional dyspnea. He has gained 30 pounds since his last visit. He has stopped getting exercise in terms of walking. He is also being treated for an upper respiratory tract infection with antibiotics.   Review of Systems: As per "subjective", otherwise negative.  No Known Allergies  Current Outpatient Prescriptions  Medication Sig Dispense Refill  . amoxicillin (AMOXIL) 500 MG tablet Take 1 tablet (500 mg total) by mouth 3 (three) times daily. 30 tablet 0  . aspirin EC 81 MG tablet Take 1 tablet (81 mg total) by mouth daily. 30 tablet 6  . atorvastatin (LIPITOR) 10 MG tablet Take 1 tablet (10 mg total) by mouth daily. 90 tablet 3  . lisinopril (PRINIVIL,ZESTRIL) 10 MG tablet TAKE 1 TABLET EVERY DAY 90 tablet 1  . metFORMIN (GLUCOPHAGE) 500 MG tablet TAKE 1 TABLET BY MOUTH TWICE DAILY WITH MEALS. (Patient taking differently: Take 500 mg by mouth 3 (three) times daily. TAKE 1 TABLET BY MOUTH TWICE DAILY WITH MEALS.) 270 tablet 1  . nitroGLYCERIN (NITROSTAT) 0.4 MG SL tablet Place 1 tablet (0.4 mg total)  under the tongue every 5 (five) minutes as needed for chest pain. 100 tablet 1  . Omega-3 Fatty Acids (OMEGA 3 PO) Take by mouth.     No current facility-administered medications for this visit.     Past Medical History:  Diagnosis Date  . Diabetes mellitus without complication ([place])   . History of stress test 2014   intermediate risk  . Hyperlipemia   . Hypertension     History reviewed. No pertinent surgical history.  Social History   Social History  . Marital status: Married    Spouse name: N/A  . Number of children: N/A  . Years of education: N/A   Occupational History  . Not on file.   Social History Main Topics  . Smoking status: Never Smoker  . Smokeless tobacco: Never Used  . Alcohol use No  . Drug use: No  . Sexual activity: Not on file   Other Topics Concern  . Not on file   Social History Narrative  . No narrative on file     Vitals:   09/13/16 1036  BP: 122/82  Pulse: 92  SpO2: 96%  Weight: 293 lb (132.9 kg)  Height: 6\' 2"  (1.88 m)    PHYSICAL EXAM General: NAD HEENT: Normal. Neck: No JVD, no thyromegaly. Lungs: Clear to auscultation bilaterally with normal respiratory effort. CV: Nondisplaced PMI.  Regular rate and rhythm, normal S1/S2, no S3/S4, no murmur. No pretibial or periankle edema.  No carotid bruit.   Abdomen: Obese.  Neurologic: Alert and oriented.  Psych: Normal affect. Skin: Normal. Musculoskeletal: No gross deformities.    ECG: Most recent ECG reviewed.      ASSESSMENT AND PLAN: 1. Shortness of breath with previously abnormal nuclear stress test: Mild exertional fatigue and dyspnea. BP well controlled. Will hold off on noninvasive testing for now. I told him that should his symptoms progress over the next 2 or 3 months in spite of weight loss, I would repeat nuclear stress testing.  2. Essential HTN: Controlled. No changes.  3. Obesity: Dietary and exercise counseling provided.  Dispo: f/u 1 year.   Kate Sable, M.D., F.A.C.C.

## 2016-11-06 LAB — HEPATIC FUNCTION PANEL
ALBUMIN: 4.4 g/dL (ref 3.6–4.8)
ALT: 26 IU/L (ref 0–44)
AST: 22 IU/L (ref 0–40)
Alkaline Phosphatase: 58 IU/L (ref 39–117)
BILIRUBIN TOTAL: 0.7 mg/dL (ref 0.0–1.2)
BILIRUBIN, DIRECT: 0.16 mg/dL (ref 0.00–0.40)
Total Protein: 7 g/dL (ref 6.0–8.5)

## 2016-11-06 LAB — LIPID PANEL
CHOL/HDL RATIO: 2.6 ratio (ref 0.0–5.0)
Cholesterol, Total: 138 mg/dL (ref 100–199)
HDL: 54 mg/dL (ref >39)
LDL Calculated: 56 mg/dL (ref 0–99)
Triglycerides: 138 mg/dL (ref 0–149)
VLDL Cholesterol Cal: 28 mg/dL (ref 5–40)

## 2016-11-06 LAB — BASIC METABOLIC PANEL
BUN/Creatinine Ratio: 15 (ref 10–24)
BUN: 17 mg/dL (ref 8–27)
CO2: 26 mmol/L (ref 18–29)
Calcium: 9.7 mg/dL (ref 8.6–10.2)
Chloride: 96 mmol/L (ref 96–106)
Creatinine, Ser: 1.17 mg/dL (ref 0.76–1.27)
GFR, EST AFRICAN AMERICAN: 76 mL/min/{1.73_m2} (ref >59)
GFR, EST NON AFRICAN AMERICAN: 66 mL/min/{1.73_m2} (ref >59)
Glucose: 172 mg/dL — ABNORMAL HIGH (ref 65–99)
Potassium: 4.7 mmol/L (ref 3.5–5.2)
Sodium: 137 mmol/L (ref 134–144)

## 2016-11-06 LAB — PSA: PROSTATE SPECIFIC AG, SERUM: 3.6 ng/mL (ref 0.0–4.0)

## 2016-11-07 ENCOUNTER — Encounter: Payer: Self-pay | Admitting: Family Medicine

## 2016-11-26 ENCOUNTER — Other Ambulatory Visit: Payer: Self-pay | Admitting: Family Medicine

## 2016-12-02 ENCOUNTER — Ambulatory Visit (INDEPENDENT_AMBULATORY_CARE_PROVIDER_SITE_OTHER): Payer: Self-pay | Admitting: Family Medicine

## 2016-12-02 ENCOUNTER — Encounter: Payer: Self-pay | Admitting: Family Medicine

## 2016-12-02 VITALS — BP 116/80 | Ht 74.0 in | Wt 296.0 lb

## 2016-12-02 DIAGNOSIS — E7849 Other hyperlipidemia: Secondary | ICD-10-CM | POA: Insufficient documentation

## 2016-12-02 DIAGNOSIS — E784 Other hyperlipidemia: Secondary | ICD-10-CM

## 2016-12-02 DIAGNOSIS — E119 Type 2 diabetes mellitus without complications: Secondary | ICD-10-CM

## 2016-12-02 DIAGNOSIS — I10 Essential (primary) hypertension: Secondary | ICD-10-CM

## 2016-12-02 LAB — POCT GLYCOSYLATED HEMOGLOBIN (HGB A1C): HEMOGLOBIN A1C: 6.9

## 2016-12-02 MED ORDER — LISINOPRIL 10 MG PO TABS: 10.0000 mg | ORAL_TABLET | Freq: Every day | ORAL | 1 refills | Status: DC

## 2016-12-02 MED ORDER — METFORMIN HCL 500 MG PO TABS: ORAL_TABLET | ORAL | 2 refills | Status: DC

## 2016-12-02 NOTE — Progress Notes (Signed)
Subjective:    Patient ID: Adam Lee, male    DOB: 02/01/1956, 61 y.o.   MRN: [id number]  Diabetes  He presents for his follow-up diabetic visit. He has type 2 diabetes mellitus. Current diabetic treatments: metformin. He is compliant with treatment all of the time. Diabetic current diet: trying to cut out breads. Exercise: does not exercise as much as he should due to weather. Home blood sugar record trend: does not check blood sugar. He does not see a podiatrist.Eye exam is not current.   A1C today. 6.9Patients admits that he consumes too much bread he also admits that he takes sweet tea as well he denies fever chills sweats denies vomiting states his overall energy level good no chest tightness pressure pain or shortness of breath he takes his medicine on a regular basis  Pt states no concerns today.   Review of Systems    please see above Objective:   Physical Exam Neck no masses lungs are clear no crackles heart regular blood pressure very good extremities no edema diabetic foot exam completed normal       Assessment & Plan:  Diabetes increase metformin 2 tablets twice daily recheck met 7 A1c in 6 months  Hyperlipidemia continue current medication doing well  HTN good control continue current measures  PSA has gone up mildly patient will need a recheck a PSA in 6 months  In 6 months patient will need metabolic 7, A1c, PSA 

## 2017-08-16 ENCOUNTER — Other Ambulatory Visit: Payer: Self-pay | Admitting: Family Medicine

## 2017-09-09 ENCOUNTER — Other Ambulatory Visit: Payer: Self-pay | Admitting: Family Medicine

## 2017-09-13 ENCOUNTER — Other Ambulatory Visit: Payer: Self-pay

## 2017-09-13 MED ORDER — ATORVASTATIN CALCIUM 10 MG PO TABS: 10.0000 mg | ORAL_TABLET | Freq: Every day | ORAL | 0 refills | Status: DC

## 2017-10-04 DIAGNOSIS — I209 Angina pectoris, unspecified: Secondary | ICD-10-CM

## 2017-10-04 HISTORY — DX: Angina pectoris, unspecified: I20.9

## 2017-10-11 ENCOUNTER — Other Ambulatory Visit: Payer: Self-pay | Admitting: Family Medicine

## 2017-10-11 ENCOUNTER — Encounter: Payer: Self-pay | Admitting: Family Medicine

## 2017-10-11 NOTE — Telephone Encounter (Signed)
1 refill and I will send him a letter

## 2018-01-24 ENCOUNTER — Other Ambulatory Visit: Payer: Self-pay | Admitting: Family Medicine

## 2018-04-16 ENCOUNTER — Other Ambulatory Visit: Payer: Self-pay | Admitting: Family Medicine

## 2018-06-20 ENCOUNTER — Other Ambulatory Visit: Payer: Self-pay | Admitting: Family Medicine

## 2018-07-31 ENCOUNTER — Other Ambulatory Visit: Payer: Self-pay | Admitting: Family Medicine

## 2018-07-31 MED ORDER — METFORMIN HCL 500 MG PO TABS: 1000.0000 mg | ORAL_TABLET | Freq: Two times a day (BID) | ORAL | 0 refills | Status: DC

## 2018-07-31 NOTE — Telephone Encounter (Signed)
Pt needing refill on metFORMIN (GLUCOPHAGE) 500 MG tablet   Pt's next appt Nov12th, 2019 for physical   [id number] - [place], [place] - [address] AT [facility]  Requesting blood work

## 2018-07-31 NOTE — Telephone Encounter (Signed)
Patient is aware we sent in enough medication to get to the appointment.

## 2018-08-07 ENCOUNTER — Telehealth: Payer: Self-pay | Admitting: Family Medicine

## 2018-08-07 DIAGNOSIS — Z79899 Other long term (current) drug therapy: Secondary | ICD-10-CM

## 2018-08-07 DIAGNOSIS — E7849 Other hyperlipidemia: Secondary | ICD-10-CM

## 2018-08-07 DIAGNOSIS — I10 Essential (primary) hypertension: Secondary | ICD-10-CM

## 2018-08-07 DIAGNOSIS — E119 Type 2 diabetes mellitus without complications: Secondary | ICD-10-CM

## 2018-08-07 DIAGNOSIS — Z125 Encounter for screening for malignant neoplasm of prostate: Secondary | ICD-10-CM

## 2018-08-07 DIAGNOSIS — R972 Elevated prostate specific antigen [PSA]: Secondary | ICD-10-CM

## 2018-08-07 NOTE — Telephone Encounter (Signed)
Patient is aware the orders have been placed

## 2018-08-07 NOTE — Telephone Encounter (Signed)
Patient has a physical scheduled for 08/15/18 with Dr.Scott, requesting blood work, pt would would like to get blood work done if necessary before 08/10/18 due to pt going out of town, advise.

## 2018-08-07 NOTE — Telephone Encounter (Signed)
Lipid, liver, metabolic 7, M3V, urine ACR, PSA Please let patient know that we are doing lab testing and urine testing for his age and diabetes

## 2018-08-07 NOTE — Telephone Encounter (Signed)
Had labs drawn 11/24/2016 psa,lip,bmet,hep.Please advise.

## 2018-08-09 LAB — HEPATIC FUNCTION PANEL
ALT: 30 IU/L (ref 0–44)
AST: 26 IU/L (ref 0–40)
Albumin: 4.6 g/dL (ref 3.6–4.8)
Alkaline Phosphatase: 61 IU/L (ref 39–117)
Bilirubin Total: 0.9 mg/dL (ref 0.0–1.2)
Bilirubin, Direct: 0.22 mg/dL (ref 0.00–0.40)
Total Protein: 7.1 g/dL (ref 6.0–8.5)

## 2018-08-09 LAB — BASIC METABOLIC PANEL
BUN/Creatinine Ratio: 16 (ref 10–24)
BUN: 21 mg/dL (ref 8–27)
CALCIUM: 9.6 mg/dL (ref 8.6–10.2)
CHLORIDE: 97 mmol/L (ref 96–106)
CO2: 26 mmol/L (ref 20–29)
Creatinine, Ser: 1.32 mg/dL — ABNORMAL HIGH (ref 0.76–1.27)
GFR calc Af Amer: 65 mL/min/{1.73_m2} (ref >59)
GFR calc non Af Amer: 57 mL/min/{1.73_m2} — ABNORMAL LOW (ref >59)
Glucose: 166 mg/dL — ABNORMAL HIGH (ref 65–99)
Potassium: 5 mmol/L (ref 3.5–5.2)
Sodium: 140 mmol/L (ref 134–144)

## 2018-08-09 LAB — MICROALBUMIN / CREATININE URINE RATIO
Creatinine, Urine: 172.9 mg/dL
MICROALB/CREAT RATIO: 8.2 mg/g{creat} (ref 0.0–30.0)
Microalbumin, Urine: 14.2 ug/mL

## 2018-08-09 LAB — PSA: PROSTATE SPECIFIC AG, SERUM: 5.4 ng/mL — AB (ref 0.0–4.0)

## 2018-08-09 LAB — HEMOGLOBIN A1C
Est. average glucose Bld gHb Est-mCnc: 180 mg/dL
HEMOGLOBIN A1C: 7.9 % — AB (ref 4.8–5.6)

## 2018-08-09 LAB — LIPID PANEL
CHOL/HDL RATIO: 2.7 ratio (ref 0.0–5.0)
Cholesterol, Total: 128 mg/dL (ref 100–199)
HDL: 48 mg/dL (ref >39)
LDL CALC: 50 mg/dL (ref 0–99)
Triglycerides: 149 mg/dL (ref 0–149)
VLDL Cholesterol Cal: 30 mg/dL (ref 5–40)

## 2018-08-15 ENCOUNTER — Encounter: Payer: Self-pay | Admitting: Family Medicine

## 2018-08-15 ENCOUNTER — Other Ambulatory Visit: Payer: Self-pay | Admitting: Family Medicine

## 2018-08-24 ENCOUNTER — Other Ambulatory Visit: Payer: Self-pay | Admitting: Family Medicine

## 2018-09-04 NOTE — Addendum Note (Signed)
Addended by: Karle Barr on: 09/04/2018 11:55 AM   Modules accepted: Orders

## 2018-09-06 ENCOUNTER — Ambulatory Visit (INDEPENDENT_AMBULATORY_CARE_PROVIDER_SITE_OTHER): Payer: Self-pay | Admitting: Family Medicine

## 2018-09-06 ENCOUNTER — Telehealth: Payer: Self-pay | Admitting: Family Medicine

## 2018-09-06 ENCOUNTER — Encounter: Payer: Self-pay | Admitting: Family Medicine

## 2018-09-06 ENCOUNTER — Other Ambulatory Visit: Payer: Self-pay | Admitting: *Deleted

## 2018-09-06 VITALS — BP 138/80 | Ht 73.5 in | Wt 289.0 lb

## 2018-09-06 DIAGNOSIS — R0609 Other forms of dyspnea: Secondary | ICD-10-CM

## 2018-09-06 DIAGNOSIS — I10 Essential (primary) hypertension: Secondary | ICD-10-CM

## 2018-09-06 DIAGNOSIS — R972 Elevated prostate specific antigen [PSA]: Secondary | ICD-10-CM

## 2018-09-06 DIAGNOSIS — E119 Type 2 diabetes mellitus without complications: Secondary | ICD-10-CM

## 2018-09-06 DIAGNOSIS — Z1211 Encounter for screening for malignant neoplasm of colon: Secondary | ICD-10-CM

## 2018-09-06 DIAGNOSIS — E7849 Other hyperlipidemia: Secondary | ICD-10-CM

## 2018-09-06 DIAGNOSIS — R0600 Dyspnea, unspecified: Secondary | ICD-10-CM

## 2018-09-06 DIAGNOSIS — Z0000 Encounter for general adult medical examination without abnormal findings: Secondary | ICD-10-CM

## 2018-09-06 NOTE — Patient Instructions (Signed)
Results for orders placed or performed in visit on 08/07/18  Lipid panel  Result Value Ref Range   Cholesterol, Total 128 100 - 199 mg/dL   Triglycerides 149 0 - 149 mg/dL   HDL 48 >39 mg/dL   VLDL Cholesterol Cal 30 5 - 40 mg/dL   LDL Calculated 50 0 - 99 mg/dL   Chol/HDL Ratio 2.7 0.0 - 5.0 ratio  Hepatic function panel  Result Value Ref Range   Total Protein 7.1 6.0 - 8.5 g/dL   Albumin 4.6 3.6 - 4.8 g/dL   Bilirubin Total 0.9 0.0 - 1.2 mg/dL   Bilirubin, Direct 0.22 0.00 - 0.40 mg/dL   Alkaline Phosphatase 61 39 - 117 IU/L   AST 26 0 - 40 IU/L   ALT 30 0 - 44 IU/L  Basic metabolic panel  Result Value Ref Range   Glucose 166 (H) 65 - 99 mg/dL   BUN 21 8 - 27 mg/dL   Creatinine, Ser 1.32 (H) 0.76 - 1.27 mg/dL   GFR calc non Af Amer 57 (L) >59 mL/min/1.73   GFR calc Af Amer 65 >59 mL/min/1.73   BUN/Creatinine Ratio 16 10 - 24   Sodium 140 134 - 144 mmol/L   Potassium 5.0 3.5 - 5.2 mmol/L   Chloride 97 96 - 106 mmol/L   CO2 26 20 - 29 mmol/L   Calcium 9.6 8.6 - 10.2 mg/dL  Hemoglobin A1c  Result Value Ref Range   Hgb A1c MFr Bld 7.9 (H) 4.8 - 5.6 %   Est. average glucose Bld gHb Est-mCnc 180 mg/dL  Microalbumin / creatinine urine ratio  Result Value Ref Range   Creatinine, Urine 172.9 Not Estab. mg/dL   Microalbumin, Urine 14.2 Not Estab. ug/mL   Microalb/Creat Ratio 8.2 0.0 - 30.0 mg/g creat  PSA  Result Value Ref Range   Prostate Specific Ag, Serum 5.4 (H) 0.0 - 4.0 ng/mL

## 2018-09-06 NOTE — Telephone Encounter (Signed)
Nurses  Please put into the electronic record for January for patient to do PSA free PSA once his insurance kicks in  Also referral for colonoscopy but recommend that they do not connect with the patient till after New Year's when his insurance kicks in

## 2018-09-06 NOTE — Progress Notes (Signed)
Subjective:    Patient ID: Adam Lee, male    DOB: 1956-09-29, 62 y.o.   MRN: [id number]  HPI The patient comes in today for a wellness visit. Patient does not do a good job of watching his diet he does not exercise he states he does take his medicines on a regular basis does have some family history of blood pressure cholesterol diabetes recently had lab work completed   A review of their health history was completed.  A review of medications was also completed.  Any needed refills; none  Eating habits: not health conscious  Falls/  MVA accidents in past few months: none  Regular exercise: not like he should  Specialist pt sees on regular basis: none  Preventative health issues were discussed.   Additional concerns: none    Review of Systems  Constitutional: Negative for activity change, appetite change and fever.  HENT: Negative for congestion and rhinorrhea.   Eyes: Negative for discharge.  Respiratory: Negative for cough and wheezing.   Cardiovascular: Negative for chest pain.  Gastrointestinal: Negative for abdominal pain, blood in stool and vomiting.  Genitourinary: Negative for difficulty urinating and frequency.  Musculoskeletal: Negative for neck pain.  Skin: Negative for rash.  Allergic/Immunologic: Negative for environmental allergies and food allergies.  Neurological: Negative for weakness and headaches.  Psychiatric/Behavioral: Negative for agitation.       Objective:   Physical Exam  Constitutional: He appears well-developed and well-nourished.  HENT:  Head: Normocephalic and atraumatic.  Right Ear: External ear normal.  Left Ear: External ear normal.  Nose: Nose normal.  Mouth/Throat: Oropharynx is clear and moist.  Eyes: Pupils are equal, round, and reactive to light. EOM are normal.  Neck: Normal range of motion. Neck supple. No thyromegaly present.  Cardiovascular: Normal rate, regular rhythm and normal heart sounds.  No murmur  heard. Pulmonary/Chest: Effort normal and breath sounds normal. No respiratory distress. He has no wheezes.  Abdominal: Soft. Bowel sounds are normal. He exhibits no distension and no mass. There is no tenderness.  Genitourinary: Penis normal.  Musculoskeletal: Normal range of motion. He exhibits no edema.  Lymphadenopathy:    He has no cervical adenopathy.  Neurological: He is alert. He exhibits normal muscle tone.  Skin: Skin is warm and dry. No erythema.  Psychiatric: He has a normal mood and affect. His behavior is normal. Judgment normal.  Prostate hard to feel because of anatomy         Assessment & Plan:  Adult wellness-complete.wellness physical was conducted today. Importance of diet and exercise were discussed in detail.  In addition to this a discussion regarding safety was also covered. We also reviewed over immunizations and gave recommendations regarding current immunization needed for age.  In addition to this additional areas were also touched on including: Preventative health exams needed:  Colonoscopy patient defers colonoscopy until covered by Medicare in January  Patient was advised yearly wellness exam   The patient was seen today as part of a comprehensive visit for diabetes. The importance of keeping her A1c at or below 7 was discussed.  Importance of regular physical activity was discussed.   The importance of adherence to medication as well as a controlled low starch/sugar diet was also discussed.  Standard follow-up visit recommended.  Also patient aware failure to keep diabetes under control increases the risk of complications. Patient will work on diet exercise E0C metabolic 7 in 3 months  HTN- Patient was seen today as part of a  visit regarding hypertension. The importance of healthy diet and regular physical activity was discussed. The importance of compliance with medications discussed.  Ideal goal is to keep blood pressure low elevated levels  certainly below 813/88 when possible.  The patient was counseled that keeping blood pressure under control lessen his risk of complications.  The importance of regular follow-ups was discussed with the patient.  Low-salt diet such as DASH recommended.  Regular physical activity was recommended as well.  Patient was advised to keep regular follow-ups. Renal insufficiency  Metabolic 7 in 3 months when well-hydrated  Elevated PSA referral to urology for further evaluation Repeat total PSA with free PSA to do this in January Patient currently does not have insurance We did do an EKG because of some shortness of breath walking up some stairs but more than likely this is related to being out of shape EKG looks good no angina symptoms if he has ongoing troubles he will let us know

## 2018-09-07 ENCOUNTER — Encounter: Payer: Self-pay | Admitting: Family Medicine

## 2018-09-07 NOTE — Addendum Note (Signed)
Addended by: Karle Barr on: 09/07/2018 08:39 AM   Modules accepted: Orders

## 2018-09-29 ENCOUNTER — Other Ambulatory Visit: Payer: Self-pay | Admitting: Family Medicine

## 2018-10-12 ENCOUNTER — Other Ambulatory Visit: Payer: Self-pay | Admitting: Family Medicine

## 2018-10-26 ENCOUNTER — Other Ambulatory Visit: Payer: Self-pay | Admitting: Family Medicine

## 2018-11-02 ENCOUNTER — Encounter: Payer: Self-pay | Admitting: Family Medicine

## 2018-12-05 ENCOUNTER — Encounter: Payer: Self-pay | Admitting: Family Medicine

## 2018-12-05 ENCOUNTER — Ambulatory Visit (INDEPENDENT_AMBULATORY_CARE_PROVIDER_SITE_OTHER): Payer: Medicare HMO | Admitting: Family Medicine

## 2018-12-05 VITALS — BP 120/88 | Ht 73.5 in | Wt 287.0 lb

## 2018-12-05 DIAGNOSIS — E119 Type 2 diabetes mellitus without complications: Secondary | ICD-10-CM

## 2018-12-05 DIAGNOSIS — Z1211 Encounter for screening for malignant neoplasm of colon: Secondary | ICD-10-CM

## 2018-12-05 DIAGNOSIS — I10 Essential (primary) hypertension: Secondary | ICD-10-CM | POA: Diagnosis not present

## 2018-12-05 DIAGNOSIS — Z23 Encounter for immunization: Secondary | ICD-10-CM | POA: Diagnosis not present

## 2018-12-05 DIAGNOSIS — E7849 Other hyperlipidemia: Secondary | ICD-10-CM

## 2018-12-05 DIAGNOSIS — R69 Illness, unspecified: Secondary | ICD-10-CM | POA: Diagnosis not present

## 2018-12-05 DIAGNOSIS — R972 Elevated prostate specific antigen [PSA]: Secondary | ICD-10-CM

## 2018-12-05 DIAGNOSIS — Z114 Encounter for screening for human immunodeficiency virus [HIV]: Secondary | ICD-10-CM

## 2018-12-05 DIAGNOSIS — Z1159 Encounter for screening for other viral diseases: Secondary | ICD-10-CM

## 2018-12-05 LAB — POCT GLYCOSYLATED HEMOGLOBIN (HGB A1C): Hemoglobin A1C: 5.9 % — AB (ref 4.0–5.6)

## 2018-12-05 MED ORDER — ZOSTER VAC RECOMB ADJUVANTED 50 MCG/0.5ML IM SUSR: 0.5000 mL | Freq: Once | INTRAMUSCULAR | 1 refills | Status: AC

## 2018-12-05 MED ORDER — METFORMIN HCL 500 MG PO TABS: 1000.0000 mg | ORAL_TABLET | Freq: Two times a day (BID) | ORAL | 1 refills | Status: DC

## 2018-12-05 MED ORDER — ATORVASTATIN CALCIUM 10 MG PO TABS: 10.0000 mg | ORAL_TABLET | Freq: Every day | ORAL | 1 refills | Status: DC

## 2018-12-05 MED ORDER — LISINOPRIL 10 MG PO TABS: 10.0000 mg | ORAL_TABLET | Freq: Every day | ORAL | 1 refills | Status: DC

## 2018-12-05 NOTE — Patient Instructions (Signed)

## 2018-12-05 NOTE — Progress Notes (Signed)
Subjective:    Patient ID: Adam Lee, male    DOB: 09-Dec-1955, 63 y.o.   MRN: [id number]  HPI  Patient is here today to follow up on his chronic health issues.  Diabetes: He is taking Metformin 500 mg two bid. The patient was seen today as part of a comprehensive diabetic check up.the patient does have diabetes.  The patient follows here on a regular basis.  The patient relates medication compliance. No significant side effects to the medications. Denies any low glucose spells. Relates compliance with diet to a reasonable level. Patient does do labwork intermittently and understands the dangers of diabetes.   Hypertension: Lisinopril 10 mg once per day. Patient for blood pressure check up.  The patient does have hypertension.  The patient is on medication.  Patient relates compliance with meds. Todays BP reviewed with the patient. Patient denies issues with medication. Patient relates reasonable diet. Patient tries to minimize salt. Patient aware of BP goals.  Hyperlipidemia: Liptor 10 once per day. .bvlip4 Patient here for follow-up regarding cholesterol.  The patient does have hyperlipidemia.  Patient does try to maintain a reasonable diet.  Patient does take the medication on a regular basis.  Denies missing a dose.  The patient denies any obvious side effects.  Prior blood work results reviewed with the patient.  The patient is aware of his cholesterol goals and the need to keep it under good control to lessen the risk of disease.  He eats healthy and exercise. Elevated PSA Does not see any specialists.  This patient does have elevated PSA was recommended to see urology but did not go forward with seeing urology.  Review of Systems  Constitutional: Negative for diaphoresis and fatigue.  HENT: Negative for congestion and rhinorrhea.   Respiratory: Negative for cough and shortness of breath.   Cardiovascular: Negative for chest pain and leg swelling.  Gastrointestinal: Negative for  abdominal pain and diarrhea.  Skin: Negative for color change and rash.  Neurological: Negative for dizziness and headaches.  Psychiatric/Behavioral: Negative for behavioral problems and confusion.       Results for orders placed or performed in visit on 12/05/18  POCT glycosylated hemoglobin (Hb A1C)  Result Value Ref Range   Hemoglobin A1C 5.9 (A) 4.0 - 5.6 %   HbA1c POC (<> result, manual entry)     HbA1c, POC (prediabetic range)     HbA1c, POC (controlled diabetic range)      Objective:   Physical Exam Vitals signs reviewed.  Constitutional:      General: He is not in acute distress. HENT:     Head: Normocephalic and atraumatic.  Eyes:     General:        Right eye: No discharge.        Left eye: No discharge.  Neck:     Trachea: No tracheal deviation.  Cardiovascular:     Rate and Rhythm: Normal rate and regular rhythm.     Heart sounds: Normal heart sounds. No murmur.  Pulmonary:     Effort: Pulmonary effort is normal. No respiratory distress.     Breath sounds: Normal breath sounds.  Lymphadenopathy:     Cervical: No cervical adenopathy.  Skin:    General: Skin is warm and dry.  Neurological:     Mental Status: He is alert.     Coordination: Coordination normal.  Psychiatric:        Behavior: Behavior normal.  Assessment & Plan:  The patient was seen today as part of a comprehensive visit for diabetes. The importance of keeping her A1c at or below 7 was discussed.  Importance of regular physical activity was discussed.   The importance of adherence to medication as well as a controlled low starch/sugar diet was also discussed.  Standard follow-up visit recommended.  Also patient aware failure to keep diabetes under control increases the risk of complications.  HTN- Patient was seen today as part of a visit regarding hypertension. The importance of healthy diet and regular physical activity was discussed. The importance of compliance with  medications discussed.  Ideal goal is to keep blood pressure low elevated levels certainly below 655/37 when possible.  The patient was counseled that keeping blood pressure under control lessen his risk of complications.  The importance of regular follow-ups was discussed with the patient.  Low-salt diet such as DASH recommended.  Regular physical activity was recommended as well.  Patient was advised to keep regular follow-ups.  The patient was seen today as part of an evaluation regarding hyperlipidemia.  Recent lab work has been reviewed with the patient as well as the goals for good cholesterol care.  In addition to this medications have been discussed the importance of compliance with diet and medications discussed as well.  Finally the patient is aware that poor control of cholesterol, noncompliance can dramatically increase the risk of complications. The patient will keep regular office visits and the patient does agreed to periodic lab work.  Patient has elevated PSA Did not follow through with seeing [facility] Lab work ordered will need follow through with [facility] await results of lab work first  Follow-up in 6 months follow-up sooner problems  Pneumonia vaccine today  Vernard Gambles Grix prescription given

## 2018-12-06 LAB — BASIC METABOLIC PANEL WITH GFR
BUN/Creatinine Ratio: 12 (ref 10–24)
BUN: 16 mg/dL (ref 8–27)
CO2: 23 mmol/L (ref 20–29)
Calcium: 9.8 mg/dL (ref 8.6–10.2)
Chloride: 98 mmol/L (ref 96–106)
Creatinine, Ser: 1.32 mg/dL — ABNORMAL HIGH (ref 0.76–1.27)
GFR calc Af Amer: 65 mL/min/1.73
GFR calc non Af Amer: 56 mL/min/1.73 — ABNORMAL LOW
Glucose: 157 mg/dL — ABNORMAL HIGH (ref 65–99)
Potassium: 4.7 mmol/L (ref 3.5–5.2)
Sodium: 138 mmol/L (ref 134–144)

## 2018-12-06 LAB — HEPATITIS C ANTIBODY

## 2018-12-06 LAB — PSA, TOTAL AND FREE
PSA, Free Pct: 11.3 %
PSA, Free: 0.61 ng/mL
Prostate Specific Ag, Serum: 5.4 ng/mL — ABNORMAL HIGH (ref 0.0–4.0)

## 2018-12-06 LAB — HIV ANTIBODY (ROUTINE TESTING W REFLEX): HIV Screen 4th Generation wRfx: NONREACTIVE

## 2018-12-07 NOTE — Addendum Note (Signed)
Addended by: Karle Barr on: 12/07/2018 09:11 AM   Modules accepted: Orders

## 2018-12-08 ENCOUNTER — Encounter: Payer: Self-pay | Admitting: Family Medicine

## 2018-12-08 ENCOUNTER — Encounter (INDEPENDENT_AMBULATORY_CARE_PROVIDER_SITE_OTHER): Payer: Self-pay | Admitting: *Deleted

## 2018-12-12 ENCOUNTER — Encounter (INDEPENDENT_AMBULATORY_CARE_PROVIDER_SITE_OTHER): Payer: Self-pay | Admitting: *Deleted

## 2019-01-10 DIAGNOSIS — S61012A Laceration without foreign body of left thumb without damage to nail, initial encounter: Secondary | ICD-10-CM | POA: Diagnosis not present

## 2019-01-10 DIAGNOSIS — S61112A Laceration without foreign body of left thumb with damage to nail, initial encounter: Secondary | ICD-10-CM | POA: Insufficient documentation

## 2019-01-10 DIAGNOSIS — E119 Type 2 diabetes mellitus without complications: Secondary | ICD-10-CM | POA: Diagnosis not present

## 2019-01-10 DIAGNOSIS — S62525D Nondisplaced fracture of distal phalanx of left thumb, subsequent encounter for fracture with routine healing: Secondary | ICD-10-CM | POA: Insufficient documentation

## 2019-01-10 DIAGNOSIS — S92912A Unspecified fracture of left toe(s), initial encounter for closed fracture: Secondary | ICD-10-CM | POA: Diagnosis not present

## 2019-01-10 DIAGNOSIS — S62525B Nondisplaced fracture of distal phalanx of left thumb, initial encounter for open fracture: Secondary | ICD-10-CM | POA: Diagnosis not present

## 2019-01-10 DIAGNOSIS — S62522B Displaced fracture of distal phalanx of left thumb, initial encounter for open fracture: Secondary | ICD-10-CM | POA: Diagnosis not present

## 2019-01-17 DIAGNOSIS — M79645 Pain in left finger(s): Secondary | ICD-10-CM | POA: Diagnosis not present

## 2019-01-17 DIAGNOSIS — M25642 Stiffness of left hand, not elsewhere classified: Secondary | ICD-10-CM | POA: Diagnosis not present

## 2019-01-17 DIAGNOSIS — S62525D Nondisplaced fracture of distal phalanx of left thumb, subsequent encounter for fracture with routine healing: Secondary | ICD-10-CM | POA: Diagnosis not present

## 2019-01-17 DIAGNOSIS — S61112D Laceration without foreign body of left thumb with damage to nail, subsequent encounter: Secondary | ICD-10-CM | POA: Diagnosis not present

## 2019-02-07 ENCOUNTER — Ambulatory Visit (INDEPENDENT_AMBULATORY_CARE_PROVIDER_SITE_OTHER): Payer: Medicare HMO | Admitting: Urology

## 2019-02-07 DIAGNOSIS — R972 Elevated prostate specific antigen [PSA]: Secondary | ICD-10-CM | POA: Diagnosis not present

## 2019-04-18 DIAGNOSIS — S62525D Nondisplaced fracture of distal phalanx of left thumb, subsequent encounter for fracture with routine healing: Secondary | ICD-10-CM | POA: Diagnosis not present

## 2019-04-23 ENCOUNTER — Other Ambulatory Visit: Payer: Self-pay | Admitting: Family Medicine

## 2019-06-07 ENCOUNTER — Ambulatory Visit: Payer: Medicare HMO | Admitting: Family Medicine

## 2019-07-11 ENCOUNTER — Other Ambulatory Visit: Payer: Self-pay | Admitting: Family Medicine

## 2019-07-11 NOTE — Telephone Encounter (Signed)
Does need to do a follow-up office visit in person or virtual May have 1 refill

## 2019-07-17 NOTE — Telephone Encounter (Signed)
Phone visit scheduled - please refill med, pt aware we will be sending in refill

## 2019-08-01 ENCOUNTER — Other Ambulatory Visit: Payer: Self-pay | Admitting: *Deleted

## 2019-08-01 MED ORDER — LISINOPRIL 10 MG PO TABS: 10.0000 mg | ORAL_TABLET | Freq: Every day | ORAL | 0 refills | Status: DC

## 2019-08-08 ENCOUNTER — Ambulatory Visit (INDEPENDENT_AMBULATORY_CARE_PROVIDER_SITE_OTHER): Payer: Medicare HMO | Admitting: Urology

## 2019-08-08 DIAGNOSIS — R972 Elevated prostate specific antigen [PSA]: Secondary | ICD-10-CM

## 2019-08-14 ENCOUNTER — Other Ambulatory Visit: Payer: Self-pay

## 2019-08-14 ENCOUNTER — Ambulatory Visit (INDEPENDENT_AMBULATORY_CARE_PROVIDER_SITE_OTHER): Payer: Medicare HMO | Admitting: Family Medicine

## 2019-08-14 ENCOUNTER — Encounter (INDEPENDENT_AMBULATORY_CARE_PROVIDER_SITE_OTHER): Payer: Self-pay | Admitting: *Deleted

## 2019-08-14 DIAGNOSIS — E7849 Other hyperlipidemia: Secondary | ICD-10-CM

## 2019-08-14 DIAGNOSIS — I10 Essential (primary) hypertension: Secondary | ICD-10-CM | POA: Diagnosis not present

## 2019-08-14 DIAGNOSIS — E119 Type 2 diabetes mellitus without complications: Secondary | ICD-10-CM

## 2019-08-14 DIAGNOSIS — Z1211 Encounter for screening for malignant neoplasm of colon: Secondary | ICD-10-CM | POA: Diagnosis not present

## 2019-08-14 MED ORDER — ATORVASTATIN CALCIUM 10 MG PO TABS: 10.0000 mg | ORAL_TABLET | Freq: Every day | ORAL | 1 refills | Status: DC

## 2019-08-14 MED ORDER — METFORMIN HCL 500 MG PO TABS: 1000.0000 mg | ORAL_TABLET | Freq: Two times a day (BID) | ORAL | 1 refills | Status: DC

## 2019-08-14 MED ORDER — BLOOD GLUCOSE METER KIT: PACK | 0 refills | Status: AC

## 2019-08-14 MED ORDER — LISINOPRIL 10 MG PO TABS: 10.0000 mg | ORAL_TABLET | Freq: Every day | ORAL | 1 refills | Status: DC

## 2019-08-14 NOTE — Addendum Note (Signed)
Addended by: Vicente Males on: 08/14/2019 01:17 PM   Modules accepted: Orders

## 2019-08-14 NOTE — Progress Notes (Signed)
   Subjective:    Patient ID: Adam Lee, male    DOB: December 17, 1955, 63 y.o.   MRN: [id number]  Diabetes He presents for his follow-up diabetic visit. He has type 2 diabetes mellitus. There are no hypoglycemic associated symptoms. Pertinent negatives for hypoglycemia include no confusion, dizziness or headaches. There are no diabetic associated symptoms. Pertinent negatives for diabetes include no chest pain and no fatigue. There are no hypoglycemic complications. There are no diabetic complications. Compliance with diabetes treatment: doesnt check blood sugar as should. He does not see a podiatrist.Eye exam is not current.  Patient also has elevated PSA He is followed by urology for this Currently right now if numbers stay good he will not have to have biopsy We did talk about the importance get the colonoscopy  Patient is trying to stay physically active try to minimize his risk of Covid Virtual Visit via Video Note  I connected with Adam Lee on 08/14/19 at  9:00 AM EST by a video enabled telemedicine application and verified that I am speaking with the correct person using two identifiers.  Location: Patient: home Provider: office   I discussed the limitations of evaluation and management by telemedicine and the availability of in person appointments. The patient expressed understanding and agreed to proceed.  History of Present Illness:    Observations/Objective:   Assessment and Plan:   Follow Up Instructions:    I discussed the assessment and treatment plan with the patient. The patient was provided an opportunity to ask questions and all were answered. The patient agreed with the plan and demonstrated an understanding of the instructions.   The patient was advised to call back or seek an in-person evaluation if the symptoms worsen or if the condition fails to improve as anticipated.  I provided 16 minutes of non-face-to-face time during this encounter.   Adam Males, LPN    Review of Systems  Constitutional: Negative for diaphoresis and fatigue.  HENT: Negative for congestion and rhinorrhea.   Respiratory: Negative for cough and shortness of breath.   Cardiovascular: Negative for chest pain and leg swelling.  Gastrointestinal: Negative for abdominal pain and diarrhea.  Skin: Negative for color change and rash.  Neurological: Negative for dizziness and headaches.  Psychiatric/Behavioral: Negative for behavioral problems and confusion.       Objective:   Physical Exam  Today's visit was via telephone Physical exam was not possible for this visit       Assessment & Plan:  1. Type 2 diabetes mellitus without complication, without long-term current use of insulin ([place]) Reportedly doing well with his diabetes.  We will send him a prescription for glucometer and supplies and strips used periodically - Lipid Profile - Basic Metabolic Panel (BMET) - Hemoglobin A1c  2. Essential hypertension Blood pressure overall doing well watching salt staying active - Lipid Profile - Basic Metabolic Panel (BMET) - Hemoglobin A1c  3. Other hyperlipidemia Takes his medication watches diet stays active - Lipid Profile - Basic Metabolic Panel (BMET) - Hemoglobin A1c  4. Encounter for screening colonoscopy Send referral for colonoscopy - Ambulatory referral to Gastroenterology

## 2019-08-20 DIAGNOSIS — I10 Essential (primary) hypertension: Secondary | ICD-10-CM | POA: Diagnosis not present

## 2019-08-20 DIAGNOSIS — E119 Type 2 diabetes mellitus without complications: Secondary | ICD-10-CM | POA: Diagnosis not present

## 2019-08-20 DIAGNOSIS — E7849 Other hyperlipidemia: Secondary | ICD-10-CM | POA: Diagnosis not present

## 2019-08-20 DIAGNOSIS — R69 Illness, unspecified: Secondary | ICD-10-CM | POA: Diagnosis not present

## 2019-08-21 LAB — LIPID PANEL
Chol/HDL Ratio: 2.3 ratio (ref 0.0–5.0)
Cholesterol, Total: 119 mg/dL (ref 100–199)
HDL: 51 mg/dL (ref >39)
LDL Chol Calc (NIH): 45 mg/dL (ref 0–99)
Triglycerides: 134 mg/dL (ref 0–149)
VLDL Cholesterol Cal: 23 mg/dL (ref 5–40)

## 2019-08-21 LAB — BASIC METABOLIC PANEL
BUN/Creatinine Ratio: 11 (ref 10–24)
BUN: 15 mg/dL (ref 8–27)
CO2: 27 mmol/L (ref 20–29)
Calcium: 9.7 mg/dL (ref 8.6–10.2)
Chloride: 98 mmol/L (ref 96–106)
Creatinine, Ser: 1.36 mg/dL — ABNORMAL HIGH (ref 0.76–1.27)
GFR calc Af Amer: 63 mL/min/{1.73_m2} (ref >59)
GFR calc non Af Amer: 54 mL/min/{1.73_m2} — ABNORMAL LOW (ref >59)
Glucose: 154 mg/dL — ABNORMAL HIGH (ref 65–99)
Potassium: 4.9 mmol/L (ref 3.5–5.2)
Sodium: 140 mmol/L (ref 134–144)

## 2019-08-21 LAB — HEMOGLOBIN A1C
Est. average glucose Bld gHb Est-mCnc: 177 mg/dL
Hgb A1c MFr Bld: 7.8 % — ABNORMAL HIGH (ref 4.8–5.6)

## 2019-08-24 ENCOUNTER — Other Ambulatory Visit (INDEPENDENT_AMBULATORY_CARE_PROVIDER_SITE_OTHER): Payer: Self-pay | Admitting: *Deleted

## 2019-08-24 DIAGNOSIS — Z1211 Encounter for screening for malignant neoplasm of colon: Secondary | ICD-10-CM | POA: Insufficient documentation

## 2019-08-26 ENCOUNTER — Encounter: Payer: Self-pay | Admitting: Family Medicine

## 2019-09-11 ENCOUNTER — Other Ambulatory Visit: Payer: Self-pay | Admitting: Family Medicine

## 2019-09-18 ENCOUNTER — Telehealth: Payer: Self-pay

## 2019-09-18 DIAGNOSIS — R972 Elevated prostate specific antigen [PSA]: Secondary | ICD-10-CM

## 2019-09-18 NOTE — Telephone Encounter (Signed)
Let pt. Know per Dr. Alyson Ingles that his PSA was up to 6.8 and he wanted to schedule a prostate  biopsy. Pt. Agreed. Prostate biopsy was scheduled with pt.  Date for biopsy was 10/10/19 at 12:30. Pt. Told to be there at 12:15. Went over instructions with pt and sent in Levaquin Rx. Appt. Information and instructions were aslo mailed to pt.

## 2019-10-09 ENCOUNTER — Telehealth (INDEPENDENT_AMBULATORY_CARE_PROVIDER_SITE_OTHER): Payer: Self-pay | Admitting: *Deleted

## 2019-10-09 ENCOUNTER — Other Ambulatory Visit: Payer: Self-pay

## 2019-10-09 ENCOUNTER — Ambulatory Visit (INDEPENDENT_AMBULATORY_CARE_PROVIDER_SITE_OTHER): Payer: Self-pay

## 2019-10-09 ENCOUNTER — Other Ambulatory Visit (INDEPENDENT_AMBULATORY_CARE_PROVIDER_SITE_OTHER): Payer: Self-pay | Admitting: *Deleted

## 2019-10-09 ENCOUNTER — Encounter (INDEPENDENT_AMBULATORY_CARE_PROVIDER_SITE_OTHER): Payer: Self-pay | Admitting: *Deleted

## 2019-10-09 MED ORDER — SUPREP BOWEL PREP KIT 17.5-3.13-1.6 GM/177ML PO SOLN: 1.0000 | Freq: Once | ORAL | 0 refills | Status: AC

## 2019-10-09 NOTE — Telephone Encounter (Signed)
Patient needs suprep TCS sch'd 2/3

## 2019-10-09 NOTE — Telephone Encounter (Signed)
Rx sent to pharmacy   

## 2019-10-09 NOTE — Telephone Encounter (Signed)
Referring MD/PCP: scott luking   Procedure: tcs  Reason/Indication:  screening  Has patient had this procedure before?  no  If so, when, by whom and where?    Is there a family history of colon cancer?  no  Who?  What age when diagnosed?    Is patient diabetic?   yes      Does patient have prosthetic heart valve or mechanical valve?  no  Do you have a pacemaker/defibrillator?  no  Has patient ever had endocarditis/atrial fibrillation? no  Does patient use oxygen? no  Has patient had joint replacement within last 12 months?  no  Is patient constipated or do they take laxatives? no  Does patient have a history of alcohol/drug use?  no  Is patient on blood thinner such as Coumadin, Plavix and/or Aspirin? yes  Medications: asa 81 mg daily, atorvastatin 10 mg daily, lisinopril 10 mg daily, metformin 500 mg bid, nitroglycerin prn  Allergies: nkda  Medication Adjustment per Dr Laural Golden: asa 2 days, hold metformin evening and morning before  Procedure date & time: 11/07/19 at 1030

## 2019-10-10 ENCOUNTER — Other Ambulatory Visit: Payer: Self-pay | Admitting: Urology

## 2019-10-10 ENCOUNTER — Other Ambulatory Visit: Payer: Self-pay | Admitting: Family Medicine

## 2019-10-10 ENCOUNTER — Ambulatory Visit (HOSPITAL_COMMUNITY)
Admission: RE | Admit: 2019-10-10 | Discharge: 2019-10-10 | Disposition: A | Discharge status: Home / Self Care | Payer: Medicare HMO | Source: Ambulatory Visit | Attending: Urology | Admitting: Urology

## 2019-10-10 ENCOUNTER — Ambulatory Visit: Payer: Medicare HMO | Admitting: Urology

## 2019-10-10 ENCOUNTER — Other Ambulatory Visit: Payer: Self-pay

## 2019-10-10 DIAGNOSIS — C61 Malignant neoplasm of prostate: Secondary | ICD-10-CM | POA: Diagnosis not present

## 2019-10-10 DIAGNOSIS — R972 Elevated prostate specific antigen [PSA]: Secondary | ICD-10-CM | POA: Diagnosis present

## 2019-10-10 MED ORDER — GENTAMICIN SULFATE 40 MG/ML IJ SOLN: 80.0000 mg | Freq: Once | INTRAMUSCULAR | Status: AC

## 2019-10-10 MED ORDER — GENTAMICIN SULFATE 40 MG/ML IJ SOLN
INTRAMUSCULAR | Status: AC
  Administered 2019-10-10: 80 mg via INTRAMUSCULAR
  Filled 2019-10-10: qty 2

## 2019-10-10 MED ORDER — LIDOCAINE HCL (PF) 2 % IJ SOLN
INTRAMUSCULAR | Status: AC
  Administered 2019-10-10: 10 mL
  Filled 2019-10-10: qty 10

## 2019-10-15 ENCOUNTER — Other Ambulatory Visit: Payer: Self-pay

## 2019-10-15 DIAGNOSIS — R972 Elevated prostate specific antigen [PSA]: Secondary | ICD-10-CM

## 2019-10-15 NOTE — Telephone Encounter (Signed)
Colonoscopy with conscious sedation 

## 2019-10-17 NOTE — Procedures (Signed)
Prostate Biopsy Procedure   Informed consent was obtained after discussing risks/benefits of the procedure.  A time out was performed to ensure correct patient identity.  Pre-Procedure: - Last PSA Level:  Lab Results  Component Value Date   PSA 2.3 12/06/2014   - Gentamicin given prophylactically - Levaquin 500 mg administered PO -Transrectal Ultrasound performed revealing a 48.8gm prostate -No significant hypoechoic or median lobe noted  Procedure: - Prostate block performed using 10 cc 1% lidocaine and biopsies taken from sextant areas, a total of 12 under ultrasound guidance.  Post-Procedure: - Patient tolerated the procedure well - He was counseled to seek immediate medical attention if experiences any severe pain, significant bleeding, or fevers - Return in one week to discuss biopsy results

## 2019-10-18 ENCOUNTER — Telehealth: Payer: Self-pay | Admitting: Urology

## 2019-10-18 NOTE — Telephone Encounter (Signed)
Pt called for his biopsy results. Would like a call back .

## 2019-10-18 NOTE — Telephone Encounter (Signed)
Pt called and scheduled for office followup to discuss biopsy results.

## 2019-10-31 ENCOUNTER — Encounter: Payer: Self-pay | Admitting: Urology

## 2019-10-31 ENCOUNTER — Ambulatory Visit (INDEPENDENT_AMBULATORY_CARE_PROVIDER_SITE_OTHER): Payer: Medicare HMO | Admitting: Urology

## 2019-10-31 ENCOUNTER — Other Ambulatory Visit: Payer: Self-pay | Admitting: Urology

## 2019-10-31 ENCOUNTER — Other Ambulatory Visit: Payer: Self-pay

## 2019-10-31 VITALS — BP 133/80 | HR 88 | Temp 98.2°F | Ht 74.0 in | Wt 280.0 lb

## 2019-10-31 DIAGNOSIS — C61 Malignant neoplasm of prostate: Secondary | ICD-10-CM | POA: Insufficient documentation

## 2019-10-31 NOTE — Progress Notes (Signed)
10/31/2019 4:30 PM   Adam Lee. [birth year] [id number]  Referring provider: Kathyrn Drown, MD [address] [place],  [place] [address]  Prostate Cancer  HPI: [name] is a 40AS here for evaluation of newly diagnosed prostate cancer. No issues with biopsy. PSA 6.8. Biopsy revealed Gleason 3+4=7 and Gleason 4+3=7 20-80% of cores all on the left. NO significant LUTS. Moderate ED.    PMH: Past Medical History:  Diagnosis Date  . Diabetes mellitus without complication ([place])   . History of stress test 2014   intermediate risk  . Hyperlipemia   . Hypertension     Surgical History: No past surgical history on file.  Home Medications:  Allergies as of 10/31/2019   No Known Allergies     Medication List       Accurate as of October 31, 2019  4:30 PM. If you have any questions, ask your nurse or doctor.        aspirin EC 81 MG tablet Take 1 tablet (81 mg total) by mouth daily. What changed: Another medication with the same name was removed. Continue taking this medication, and follow the directions you see here. Changed by: Adam Bang, MD   atorvastatin 10 MG tablet Commonly known as: LIPITOR TAKE 1 TABLET BY MOUTH EVERY DAY What changed: Another medication with the same name was removed. Continue taking this medication, and follow the directions you see here. Changed by: Adam Bang, MD   blood glucose meter kit and supplies Dispense based on patient and insurance preference. Use to check sugars once a day (FOR ICD-10 E10.9, E11.9).   lisinopril 10 MG tablet Commonly known as: ZESTRIL   lisinopril 10 MG tablet Commonly known as: ZESTRIL TAKE 1 TABLET BY MOUTH EVERY DAY   metFORMIN 500 MG tablet Commonly known as: GLUCOPHAGE Take 2 tablets (1,000 mg total) by mouth 2 (two) times daily. What changed: Another medication with the same name was removed. Continue taking this medication, and follow the directions you see here. Changed by:  Adam Bang, MD   nitroGLYCERIN 0.4 MG SL tablet Commonly known as: NITROSTAT Place 1 tablet (0.4 mg total) under the tongue every 5 (five) minutes as needed for chest pain. What changed: Another medication with the same name was removed. Continue taking this medication, and follow the directions you see here. Changed by: Adam Bang, MD   OMEGA 3 PO Take by mouth.   Fish Oil 1000 MG Caps Take by mouth.   OneTouch Delica Plus [id number] Misc USE TO OBTAIN A BLOOD SPECIMEN TO TEST BLOOD SUGAR DAILY   OneTouch Ultra test strip Generic drug: glucose blood USE TO TEST BLOOD SUGAR DAILY       Allergies: No Known Allergies  Family History: Family History  Problem Relation Age of Onset  . Diabetes Father   . Heart disease Mother   . Cancer Unknown     Social History:  reports that he has never smoked. He has never used smokeless tobacco. He reports that he does not drink alcohol or use drugs.  ROS: All other review of systems were reviewed and are negative except what is noted above in HPI  Physical Exam: BP 133/80   Pulse 88   Temp 98.2 F (36.8 C)   Ht _0  (1.88 m)   Wt 280 lb (127 kg)   BMI 35.95 kg/m   Constitutional:  Alert and oriented, No acute distress. HEENT: [place] AT, moist mucus membranes.  Trachea  midline, no masses. Cardiovascular: No clubbing, cyanosis, or edema. Respiratory: Normal respiratory effort, no increased work of breathing. GI: Abdomen is soft, nontender, nondistended, no abdominal masses GU: No CVA tenderness Lymph: No cervical or inguinal lymphadenopathy. Skin: No rashes, bruises or suspicious lesions. Neurologic: Grossly intact, no focal deficits, moving all 4 extremities. Psychiatric: Normal mood and affect.  Laboratory Data: Lab Results  Component Value Date   WBC 7.5 05/06/2015   HGB 15.8 05/06/2015   HCT 46.9 05/06/2015   MCV 90.0 05/06/2015   PLT 203 05/06/2015    Lab Results  Component Value Date   CREATININE  1.36 (H) 08/20/2019    Lab Results  Component Value Date   PSA 2.3 12/06/2014    No results found for: TESTOSTERONE  Lab Results  Component Value Date   HGBA1C 7.8 (H) 08/20/2019    Urinalysis No results found for: COLORURINE, APPEARANCEUR, LABSPEC, PHURINE, GLUCOSEU, HGBUR, BILIRUBINUR, KETONESUR, PROTEINUR, UROBILINOGEN, NITRITE, LEUKOCYTESUR  Lab Results  Component Value Date   LABMICR 14.2 08/08/2018    Pertinent Imaging:  No results found for this or any previous visit. No results found for this or any previous visit. No results found for this or any previous visit. No results found for this or any previous visit. No results found for this or any previous visit. No results found for this or any previous visit. No results found for this or any previous visit. No results found for this or any previous visit.  Assessment & Plan:    1. Prostate Cancer: -I discussed the natural history of intermediate/high risk prostate cancer with the patient as well as the various treatment options including active surveillance, RALP, IMRT, brachytherapy, crytotherapy, HIFU and ADT. I will have him see Adam Lee for consideration of RALP and Adam Lee for consideration of IMRT/brachytherapy.    No follow-ups on file.  Adam Bang, MD  [facility] [place]

## 2019-10-31 NOTE — Patient Instructions (Signed)
Prostate Cancer  The prostate is a walnut-sized gland that is involved in the production of semen. It is located below a man's bladder, in front of the rectum. Prostate cancer is the abnormal growth of cells in the prostate gland. What are the causes? The exact cause of this condition is not known. What increases the risk? This condition is more likely to develop in men who:  Are older than age 65.  Are African-American.  Are obese.  Have a family history of prostate cancer.  Have a family history of breast cancer. What are the signs or symptoms? Symptoms of this condition include:  A need to urinate often.  Weak or interrupted flow of urine.  Trouble starting or stopping urination.  Inability to urinate.  Pain or burning during urination.  Painful ejaculation.  Blood in urine or semen.  Persistent pain or discomfort in the lower back, lower abdomen, hips, or upper thighs.  Trouble getting an erection.  Trouble emptying the bladder all the way. How is this diagnosed? This condition can be diagnosed with:  A digital rectal exam. For this exam, a health care provider inserts a gloved finger into the rectum to feel the prostate gland.  A blood test called a prostate-specific antigen (PSA) test.  An imaging test called transrectal ultrasonography.  A procedure in which a sample of tissue is taken from the prostate and examined under a microscope (prostate biopsy). Once the condition is diagnosed, tests will be done to determine how far the cancer has spread. This is called staging the cancer. Staging may involve imaging tests, such as:  A bone scan.  A CT scan.  A PET scan.  An MRI. The stages of prostate cancer are as follows:  Stage I. At this stage, the cancer is found in the prostate only. The cancer is not visible on imaging tests and it is usually found by accident, such as during a prostate surgery.  Stage II. At this stage, the cancer is more advanced  than it is in stage I, but the cancer has not spread outside the prostate.  Stage III. At this stage, the cancer has spread beyond the outer layer of the prostate to nearby tissues. The cancer may be found in the seminal vesicles, which are near the bladder and the prostate.  Stage IV. At this stage, the cancer has spread other parts of the body, such as the lymph nodes, bones, bladder, rectum, liver, or lungs. How is this treated? Treatment for this condition depends on several factors, including the stage of the cancer, your age, personal preferences, and your overall health. Talk with your health care provider about treatment options that are recommended for you. Common treatments include:  Observation for early stage prostate cancer (active surveillance). This involves having exams, blood tests, and in some cases, more biopsies. For some men, this is the only treatment needed.  Surgery. Types of surgeries include: ? Open surgery. In this surgery, a larger incision is made to remove the prostate. ? A laparoscopic prostatectomy. This is a surgery to remove the prostate and lymph nodes through several, small incisions. It is often referred to as a minimally invasive surgery. ? A robotic prostatectomy. This is a surgery to remove the prostate and lymph nodes with the help of a robotic arm that is controlled by a computer. ? Orchiectomy. This is a surgery to remove the testicles. ? Cryosurgery. This is a surgery to freeze and destroy cancer cells.  Radiation treatment. Types   of radiation treatment include: ? External beam radiation. This type aims beams of radiation from outside the body at the prostate to destroy cancerous cells. ? Brachytherapy. This type uses radioactive needles, seeds, wires, or tubes that are implanted into the prostate gland. Like external beam radiation, brachytherapy destroys cancerous cells. An advantage is that this type of radiation limits the damage to surrounding  tissue and has fewer side effects.  High-intensity, focused ultrasonography. This treatment destroys cancer cells by delivering high-energy ultrasound waves to the cancerous cells.  Chemotherapy medicines. This treatment kills cancer cells or stops them from multiplying.  Hormone treatment. This treatment involves taking medicines that act on one of the male hormones (testosterone): ? By stopping your body from producing testosterone. ? By blocking testosterone from reaching cancer cells. Follow these instructions at home:  Take over-the-counter and prescription medicines only as told by your health care provider.  Maintain a healthy diet.  Get plenty of sleep.  Consider joining a support group for men who have prostate cancer. Meeting with a support group may help you learn to cope with the stress of having cancer.  Keep all follow-up visits as told by your health care provider. This is important.  If you have to go to the hospital, notify your cancer specialist (oncologist).  Treatment for prostate cancer may affect sexual function. Continue to have intimate moments with your partner. This may include touching, holding, hugging, and caressing. Contact a health care provider if:  You have trouble urinating.  You have blood in your urine.  You have pain in your hips, back, or chest. Get help right away if:  You have weakness or numbness in your legs.  You cannot control urination or your bowel movements (incontinence).  You have trouble breathing.  You have sudden chest pain.  You have chills or a fever. Summary  The prostate is a walnut-sized gland that is involved in the production of semen. It is located below a man's bladder, in front of the rectum. Prostate cancer is the abnormal growth of cells in the prostate gland.  Treatment for this condition depends on several factors, including the stage of the cancer, your age, personal preferences, and your overall health.  Talk with your health care provider about treatment options that are recommended for you.  Consider joining a support group for men who have prostate cancer. Meeting with a support group may help you learn to cope with the stress of having cancer. This information is not intended to replace advice given to you by your health care provider. Make sure you discuss any questions you have with your health care provider. Document Revised: 09/02/2017 Document Reviewed: 05/31/2016 Elsevier Patient Education  2020 Elsevier Inc.  

## 2019-11-01 ENCOUNTER — Other Ambulatory Visit: Payer: Self-pay | Admitting: Urology

## 2019-11-01 LAB — BASIC METABOLIC PANEL
BUN/Creatinine Ratio: 16 (calc) (ref 6–22)
BUN: 23 mg/dL (ref 7–25)
CO2: 26 mmol/L (ref 20–32)
Calcium: 9 mg/dL (ref 8.6–10.3)
Chloride: 102 mmol/L (ref 98–110)
Creat: 1.46 mg/dL — ABNORMAL HIGH (ref 0.70–1.25)
Glucose, Bld: 196 mg/dL — ABNORMAL HIGH (ref 65–139)
Potassium: 4.4 mmol/L (ref 3.5–5.3)
Sodium: 138 mmol/L (ref 135–146)

## 2019-11-02 ENCOUNTER — Other Ambulatory Visit: Payer: Self-pay | Admitting: Family Medicine

## 2019-11-02 ENCOUNTER — Other Ambulatory Visit: Payer: Self-pay | Admitting: Urology

## 2019-11-05 ENCOUNTER — Other Ambulatory Visit (HOSPITAL_COMMUNITY): Payer: Medicare HMO

## 2019-11-07 ENCOUNTER — Encounter (HOSPITAL_COMMUNITY): Payer: Self-pay

## 2019-11-07 ENCOUNTER — Ambulatory Visit (HOSPITAL_COMMUNITY): Admit: 2019-11-07 | Payer: Medicare HMO | Admitting: Internal Medicine

## 2019-11-07 DIAGNOSIS — Z1211 Encounter for screening for malignant neoplasm of colon: Secondary | ICD-10-CM

## 2019-11-07 SURGERY — COLONOSCOPY
Anesthesia: Moderate Sedation

## 2019-11-08 ENCOUNTER — Other Ambulatory Visit: Payer: Self-pay | Admitting: Family Medicine

## 2019-11-08 ENCOUNTER — Encounter: Payer: Self-pay | Admitting: Radiation Oncology

## 2019-11-08 DIAGNOSIS — R69 Illness, unspecified: Secondary | ICD-10-CM | POA: Diagnosis not present

## 2019-11-08 NOTE — Progress Notes (Signed)
GU Location of Tumor / Histology: prostatic adenocarcinoma  If Prostate Cancer, Gleason Score is (4 + 3) and PSA is (6.8). Prostate volume: 48.8 grams  Biopsies of prostate (if applicable) revealed:    Past/Anticipated interventions by urology, if any: prostate biopsy, referral to Dr. Tresa Moore to discuss RALP, referral to Dr. Tammi Klippel to discuss radiation therapy options, CT abd/pelvis ordered but not scheduled, bone scan ordered but not scheduled.  Past/Anticipated interventions by medical oncology, if any: no  Weight changes, if any: denies  Bowel/Bladder complaints, if any: IPSS 7. SHIM 2. No sexual activity in six months. Denies dysuria or hematuria. Denies urinary leakage or incontinence. Denies any bowel complaints.    Nausea/Vomiting, if any: denies  Pain issues, if any:  denies  SAFETY ISSUES:  Prior radiation? denies  Pacemaker/ICD? denies  Possible current pregnancy? no, male patient  Is the patient on methotrexate? denies  Current Complaints / other details:  64 year old male. Married with two children. No know of any family members with any cancer. Discussed RALP with Dr. Tresa Moore. Scheduled to follow up with McKenzie on 12/05/19.

## 2019-11-09 ENCOUNTER — Ambulatory Visit
Admission: RE | Admit: 2019-11-09 | Discharge: 2019-11-09 | Disposition: A | Discharge status: Home / Self Care | Payer: Medicare HMO | Source: Ambulatory Visit | Attending: Radiation Oncology | Admitting: Radiation Oncology

## 2019-11-09 ENCOUNTER — Encounter: Payer: Self-pay | Admitting: Radiation Oncology

## 2019-11-09 ENCOUNTER — Other Ambulatory Visit: Payer: Self-pay

## 2019-11-09 VITALS — Ht 74.5 in | Wt 280.0 lb

## 2019-11-09 DIAGNOSIS — C61 Malignant neoplasm of prostate: Secondary | ICD-10-CM

## 2019-11-09 DIAGNOSIS — R972 Elevated prostate specific antigen [PSA]: Secondary | ICD-10-CM | POA: Diagnosis not present

## 2019-11-09 HISTORY — DX: Malignant neoplasm of prostate: C61

## 2019-11-09 NOTE — Progress Notes (Signed)
Radiation Oncology         [phone] ________________________________  Initial outpatient Consultation - Conducted via Telephone due to current COVID-19 concerns for limiting patient exposure  Name: Adam D Hyle Jr. MRN: [id number]  Date: 11/09/2019  DOB: 01/03/1956  CC:Luking, Scott A, MD  McKenzie, Patrick L, MD   REFERRING PHYSICIAN: McKenzie, Patrick L, MD  DIAGNOSIS: 64 y.o. gentleman with Stage T1c adenocarcinoma of the prostate with Gleason score of 4+3, and PSA of 6.8.    ICD-10-CM   1. Prostate cancer (HCC)  C61     HISTORY OF PRESENT ILLNESS: Adam D Chea Jr. is a 64 y.o. male with a diagnosis of prostate cancer. He was noted to have an elevated PSA of 5.4 by his primary care physician, Dr. Luking.  Accordingly, he was referred for evaluation in urology by Dr. McKenzie on 08/08/2019.  PSA was repeated at that time remained elevated at 6.8.  The patient proceeded to transrectal ultrasound with 12 biopsies of the prostate on 10/10/2019.  The prostate volume measured 48.8 cc.  Out of 12 core biopsies, 6 were positive, all on the left.  The maximum Gleason score was 4+3, and this was seen in left base lateral (with perineural invasion) and left mid lateral. Gleason 3+4 was seen in the remaining left-sided cores, with perineural invasion to the left apex.  The patient reviewed the biopsy results with his urologist and he has kindly been referred today for discussion of potential radiation treatment options.  He will also see Dr. Manny to discuss radical prostatectomy as his surgical treatment option.   PREVIOUS RADIATION THERAPY: No  PAST MEDICAL HISTORY:  Past Medical History:  Diagnosis Date  . Diabetes mellitus without complication (H[facility])   . History of stress test 2014   intermediate risk  . Hyperlipemia   . Hypertension   . Prostate cancer ([facility])       PAST SURGICAL HISTORY:History reviewed. No pertinent surgical history.  FAMILY HISTORY:  Family History  Problem  Relation Age of Onset  . Diabetes Father   . Heart disease Mother   . Cancer Other   . Breast cancer Neg Hx   . Prostate cancer Neg Hx   . Colon cancer Neg Hx   . Pancreatic cancer Neg Hx     SOCIAL HISTORY:  Social History   Socioeconomic History  . Marital status: Married    Spouse name: Not on file  . Number of children: Not on file  . Years of education: Not on file  . Highest education level: Not on file  Occupational History  . Not on file  Tobacco Use  . Smoking status: Never Smoker  . Smokeless tobacco: Never Used  Substance and Sexual Activity  . Alcohol use: No    Alcohol/week: 0.0 standard drinks  . Drug use: No  . Sexual activity: Not Currently  Other Topics Concern  . Not on file  Social History Narrative  . Not on file   Social Determinants of Health   Financial Resource Strain:   . Difficulty of Paying Living Expenses: Not on file  Food Insecurity:   . Worried About Running Out of Food in the Last Year: Not on file  . Ran Out of Food in the Last Year: Not on file  Transportation Needs:   . Lack of Transportation (Medical): Not on file  . Lack of Transportation (Non-Medical): Not on file  Physical Activity:   . Days of Exercise per Week: Not on   file  . Minutes of Exercise per Session: Not on file  Stress:   . Feeling of Stress : Not on file  Social Connections:   . Frequency of Communication with Friends and Family: Not on file  . Frequency of Social Gatherings with Friends and Family: Not on file  . Attends Religious Services: Not on file  . Active Member of Clubs or Organizations: Not on file  . Attends Club or Organization Meetings: Not on file  . Marital Status: Not on file  Intimate Partner Violence:   . Fear of Current or Ex-Partner: Not on file  . Emotionally Abused: Not on file  . Physically Abused: Not on file  . Sexually Abused: Not on file    ALLERGIES: Patient has no known allergies.  MEDICATIONS:  Current Outpatient  Medications  Medication Sig Dispense Refill  . aspirin EC 81 MG tablet Take 1 tablet (81 mg total) by mouth daily. 30 tablet 6  . atorvastatin (LIPITOR) 10 MG tablet TAKE 1 TABLET BY MOUTH EVERY DAY 90 tablet 0  . blood glucose meter kit and supplies Dispense based on patient and insurance preference. Use to check sugars once a day (FOR ICD-10 E10.9, E11.9). 1 each 0  . Lancets (ONETOUCH DELICA PLUS LANCET33G) MISC USE TO OBTAIN A BLOOD SPECIMEN TO TEST BLOOD SUGAR DAILY    . lisinopril (ZESTRIL) 10 MG tablet     . lisinopril (ZESTRIL) 10 MG tablet TAKE 1 TABLET BY MOUTH EVERY DAY 30 tablet 3  . metFORMIN (GLUCOPHAGE) 500 MG tablet Take 2 tablets (1,000 mg total) by mouth 2 (two) times daily. 360 tablet 1  . nitroGLYCERIN (NITROSTAT) 0.4 MG SL tablet Place 1 tablet (0.4 mg total) under the tongue every 5 (five) minutes as needed for chest pain. 100 tablet 1  . Omega-3 Fatty Acids (FISH OIL) 1000 MG CAPS Take by mouth.    . Omega-3 Fatty Acids (OMEGA 3 PO) Take by mouth.    . ONETOUCH ULTRA test strip USE TO TEST BLOOD SUGAR DAILY 100 strip 0   No current facility-administered medications for this encounter.    REVIEW OF SYSTEMS:  On review of systems, the patient reports that he is doing well overall. He denies any chest pain, shortness of breath, cough, fevers, chills, night sweats, unintended weight changes. He denies any bowel disturbances, and denies abdominal pain, nausea or vomiting. He denies any new musculoskeletal or joint aches or pains. His IPSS was 7, indicating mild to moderate urinary symptoms. His SHIM was 2, indicating he has severe erectile dysfunction. He notes he has not been sexually active for the last 6 months. A complete review of systems is obtained and is otherwise negative.    PHYSICAL EXAM:  Wt Readings from Last 3 Encounters:  10/31/19 280 lb (127 kg)  12/05/18 287 lb 0.6 oz (130.2 kg)  09/06/18 289 lb (131.1 kg)   Temp Readings from Last 3 Encounters:  10/31/19  98.2 F (36.8 C)  10/10/19 98.7 F (37.1 C) (Oral)  09/02/16 98.7 F (37.1 C) (Oral)   BP Readings from Last 3 Encounters:  10/31/19 133/80  10/10/19 (!) 142/76  12/05/18 120/88   Pulse Readings from Last 3 Encounters:  10/31/19 88  10/10/19 (!) 48  09/13/16 92    /10  In general this is a well appearing man in no acute distress. He's alert and oriented x4 and appropriate throughout the examination. Cardiopulmonary assessment is negative for acute distress and he exhibits normal effort.      KPS = 100  100 - Normal; no complaints; no evidence of disease. 90   - Able to carry on normal activity; minor signs or symptoms of disease. 80   - Normal activity with effort; some signs or symptoms of disease. 70   - Cares for self; unable to carry on normal activity or to do active work. 60   - Requires occasional assistance, but is able to care for most of his personal needs. 50   - Requires considerable assistance and frequent medical care. 40   - Disabled; requires special care and assistance. 30   - Severely disabled; hospital admission is indicated although death not imminent. 20   - Very sick; hospital admission necessary; active supportive treatment necessary. 10   - Moribund; fatal processes progressing rapidly. 0     - Dead  Karnofsky DA, Abelmann WH, Craver LS and Burchenal JH (1948) The use of the nitrogen mustards in the palliative treatment of carcinoma: with particular reference to bronchogenic carcinoma Cancer 1 634-56  LABORATORY DATA:  Lab Results  Component Value Date   WBC 7.5 05/06/2015   HGB 15.8 05/06/2015   HCT 46.9 05/06/2015   MCV 90.0 05/06/2015   PLT 203 05/06/2015   Lab Results  Component Value Date   NA 138 10/31/2019   K 4.4 10/31/2019   CL 102 10/31/2019   CO2 26 10/31/2019   Lab Results  Component Value Date   ALT 30 08/08/2018   AST 26 08/08/2018   ALKPHOS 61 08/08/2018   BILITOT 0.9 08/08/2018     RADIOGRAPHY: No results found.     IMPRESSION/PLAN: This visit was conducted via Telephone to spare the patient unnecessary potential exposure in the healthcare setting during the current COVID-19 pandemic. 1. 64 y.o. gentleman with Stage T1c adenocarcinoma of the prostate with Gleason Score of 4+3, and PSA of 6.8. We discussed the patient's workup and outlined the nature of prostate cancer in this setting. The patient's T stage, Gleason's score, and PSA put him into the intermediate risk group. Accordingly, he is eligible for a variety of potential treatment options including brachytherapy, 5.5 weeks of external radiation or prostatectomy. We discussed the available radiation techniques, and focused on the details and logistics and delivery. We discussed and outlined the risks, benefits, short and long-term effects associated with radiotherapy and compared and contrasted these with prostatectomy. We discussed the role of SpaceOAR in reducing the rectal toxicity associated with radiotherapy.   At the end of the conversation the patient is really most interested in moving forward with prostatectomy.  We spent some talking about how radiation is sometimes used after prostatectomy.   Given current concerns for patient exposure during the COVID-19 pandemic, this encounter was conducted via telephone. The patient was notified in advance and was offered a MyChart meeting to allow for face to face communication but unfortunately reported that he did not have the appropriate resources/technology to support such a visit and instead preferred to proceed with telephone consult. The patient has given verbal consent for this type of encounter. The time spent today on this encounter was 70 minutes. The attendants for this meeting include Matthew Manning MD, and patient Itai D Koral Jr. and his wife During the encounter, Matthew Manning MD was located at [facility] Radiation Oncology Department.  Patient Selmer D Kustra Jr. was located  at home.   ------------------------------------------------   Matthew Manning, MD [place]  Radiation Oncology Medical Director and Director of Stereotactic Radiosurgery   Direct Dial: [phone]  Fax: [phone] [email or web address]  Skype  [email or web address]  This document serves as a record of services personally performed by Matthew Manning, MD. It was created on his behalf by Katie Daubenspeck, a trained medical scribe. The creation of this record is based on the scribe's personal observations and the provider's statements to them. This document has been checked and approved by the attending provider.  

## 2019-11-18 ENCOUNTER — Other Ambulatory Visit: Payer: Self-pay | Admitting: Family Medicine

## 2019-11-19 ENCOUNTER — Encounter: Payer: Self-pay | Admitting: Medical Oncology

## 2019-11-19 DIAGNOSIS — R69 Illness, unspecified: Secondary | ICD-10-CM | POA: Diagnosis not present

## 2019-11-20 ENCOUNTER — Telehealth: Payer: Self-pay | Admitting: *Deleted

## 2019-11-20 NOTE — Telephone Encounter (Signed)
RETURNED PATIENT'S PHONE CALL, SPOKE WITH PATIENT. ?

## 2019-11-27 ENCOUNTER — Telehealth (INDEPENDENT_AMBULATORY_CARE_PROVIDER_SITE_OTHER): Payer: Self-pay | Admitting: *Deleted

## 2019-11-27 ENCOUNTER — Encounter (INDEPENDENT_AMBULATORY_CARE_PROVIDER_SITE_OTHER): Payer: Self-pay | Admitting: *Deleted

## 2019-11-27 ENCOUNTER — Other Ambulatory Visit (INDEPENDENT_AMBULATORY_CARE_PROVIDER_SITE_OTHER): Payer: Self-pay | Admitting: *Deleted

## 2019-11-27 NOTE — Telephone Encounter (Signed)
Patient needs plenvu (copay card) TCS sch'd 4/1

## 2019-11-28 ENCOUNTER — Other Ambulatory Visit (INDEPENDENT_AMBULATORY_CARE_PROVIDER_SITE_OTHER): Payer: Self-pay | Admitting: *Deleted

## 2019-11-28 DIAGNOSIS — Z1211 Encounter for screening for malignant neoplasm of colon: Secondary | ICD-10-CM

## 2019-11-28 MED ORDER — PLENVU 140 G PO SOLR: 1.0000 | Freq: Once | ORAL | 0 refills | Status: AC

## 2019-11-29 ENCOUNTER — Encounter (HOSPITAL_COMMUNITY)
Admission: RE | Admit: 2019-11-29 | Discharge: 2019-11-29 | Disposition: A | Discharge status: Home / Self Care | Payer: Medicare HMO | Source: Ambulatory Visit | Attending: Urology | Admitting: Urology

## 2019-11-29 ENCOUNTER — Ambulatory Visit (HOSPITAL_COMMUNITY)
Admission: RE | Admit: 2019-11-29 | Discharge: 2019-11-29 | Disposition: A | Discharge status: Home / Self Care | Payer: Medicare HMO | Source: Ambulatory Visit | Attending: Urology | Admitting: Urology

## 2019-11-29 ENCOUNTER — Other Ambulatory Visit: Payer: Self-pay

## 2019-11-29 DIAGNOSIS — C61 Malignant neoplasm of prostate: Secondary | ICD-10-CM

## 2019-11-29 MED ORDER — SODIUM CHLORIDE (PF) 0.9 % IJ SOLN
INTRAMUSCULAR | Status: AC
  Filled 2019-11-29: qty 50

## 2019-11-29 MED ORDER — TECHNETIUM TC 99M MEDRONATE IV KIT
20.3000 | PACK | Freq: Once | INTRAVENOUS | Status: AC | PRN
  Administered 2019-11-29: 11:00:00 20.3 via INTRAVENOUS

## 2019-11-29 MED ORDER — IOHEXOL 300 MG/ML  SOLN
100.0000 mL | Freq: Once | INTRAMUSCULAR | Status: AC | PRN
  Administered 2019-11-29: 80 mL via INTRAVENOUS

## 2019-12-03 ENCOUNTER — Ambulatory Visit (INDEPENDENT_AMBULATORY_CARE_PROVIDER_SITE_OTHER): Payer: Self-pay

## 2019-12-03 ENCOUNTER — Other Ambulatory Visit: Payer: Self-pay

## 2019-12-03 ENCOUNTER — Telehealth: Payer: Self-pay

## 2019-12-03 ENCOUNTER — Telehealth (INDEPENDENT_AMBULATORY_CARE_PROVIDER_SITE_OTHER): Payer: Self-pay | Admitting: *Deleted

## 2019-12-03 DIAGNOSIS — C61 Malignant neoplasm of prostate: Secondary | ICD-10-CM | POA: Diagnosis not present

## 2019-12-03 NOTE — Telephone Encounter (Signed)
-----   Message from Dorisann Frames, RN sent at 12/03/2019 10:18 AM EST ----- Left message to return call ----- Message ----- From: Cleon Gustin, MD Sent: 12/03/2019   9:45 AM EST To: Dorisann Frames, RN  Bone scan normal ----- Message ----- From: Dorisann Frames, RN Sent: 11/30/2019   8:33 AM EST To: Cleon Gustin, MD  Please review

## 2019-12-03 NOTE — Telephone Encounter (Signed)
Referring MD/PCP: scott luking   Procedure: tcs  Reason/Indication:  screening  Has patient had this procedure before?  no             If so, when, by whom and where?    Is there a family history of colon cancer?  no             Who?  What age when diagnosed?    Is patient diabetic?   yes                                                  Does patient have prosthetic heart valve or mechanical valve?  no  Do you have a pacemaker/defibrillator?  no  Has patient ever had endocarditis/atrial fibrillation? no  Does patient use oxygen? no  Has patient had joint replacement within last 12 months?  no  Is patient constipated or do they take laxatives? no  Does patient have a history of alcohol/drug use?  no  Is patient on blood thinner such as Coumadin, Plavix and/or Aspirin? yes  Medications: asa 81 mg daily, atorvastatin 10 mg daily, lisinopril 10 mg daily, metformin 500 mg bid, nitroglycerin prn  Allergies: nkda  Medication Adjustment per Dr Laural Golden: asa 2 days, hold metformin evening and morning before  Procedure date & time: 01/03/20

## 2019-12-03 NOTE — Telephone Encounter (Signed)
Pt called back and notified of results.

## 2019-12-04 ENCOUNTER — Other Ambulatory Visit: Payer: Self-pay | Admitting: Urology

## 2019-12-05 ENCOUNTER — Other Ambulatory Visit: Payer: Self-pay

## 2019-12-05 ENCOUNTER — Encounter: Payer: Self-pay | Admitting: Urology

## 2019-12-05 ENCOUNTER — Ambulatory Visit (INDEPENDENT_AMBULATORY_CARE_PROVIDER_SITE_OTHER): Payer: Medicare HMO | Admitting: Urology

## 2019-12-05 VITALS — BP 153/82 | HR 85 | Temp 97.2°F | Ht 74.0 in | Wt 280.0 lb

## 2019-12-05 DIAGNOSIS — C61 Malignant neoplasm of prostate: Secondary | ICD-10-CM

## 2019-12-05 NOTE — Progress Notes (Signed)
12/05/2019 10:47 AM   Adam Lee. January 30, 1956 [id number]  Referring provider: Kathyrn Drown, MD [place],  [place] [id number]  Prostate cancer  HPI: Adam Lee is a [phone] here for followup for prostate cancer. He met with Dr. Tresa Moore and Dr. Tammi Klippel and has decided to proceed with robotic prostatectomy scheduled for 4/21. CT and bone scan negative for metastatic disease. No change in health and no new medical problems since last visit.    PMH: Past Medical History:  Diagnosis Date  . Diabetes mellitus without complication ([place])   . History of stress test 2014   intermediate risk  . Hyperlipemia   . Hypertension   . Prostate cancer [facility])     Surgical History: No past surgical history on file.  Home Medications:  Allergies as of 12/05/2019   No Known Allergies     Medication List       Accurate as of December 05, 2019 10:47 AM. If you have any questions, ask your nurse or doctor.        aspirin EC 81 MG tablet Take 1 tablet (81 mg total) by mouth daily.   atorvastatin 10 MG tablet Commonly known as: LIPITOR TAKE 1 TABLET BY MOUTH EVERY DAY   blood glucose meter kit and supplies Dispense based on patient and insurance preference. Use to check sugars once a day (FOR ICD-10 E10.9, E11.9).   Fish Oil 1000 MG Caps Take by mouth.   lisinopril 10 MG tablet Commonly known as: ZESTRIL TAKE 1 TABLET BY MOUTH EVERY DAY   metFORMIN 500 MG tablet Commonly known as: GLUCOPHAGE Take 2 tablets (1,000 mg total) by mouth 2 (two) times daily.   nitroGLYCERIN 0.4 MG SL tablet Commonly known as: NITROSTAT Place 1 tablet (0.4 mg total) under the tongue every 5 (five) minutes as needed for chest pain.   OneTouch Delica Plus [id number] Misc USE TO OBTAIN A BLOOD SPECIMEN TO TEST BLOOD SUGAR DAILY   OneTouch Ultra test strip Generic drug: glucose blood USE TO TEST BLOOD SUGAR DAILY       Allergies: No Known Allergies  Family History: Family History    Problem Relation Age of Onset  . Diabetes Father   . Heart disease Mother   . Cancer Other   . Breast cancer Neg Hx   . Prostate cancer Neg Hx   . Colon cancer Neg Hx   . Pancreatic cancer Neg Hx     Social History:  reports that he has never smoked. He has never used smokeless tobacco. He reports that he does not drink alcohol or use drugs.  ROS: All other review of systems were reviewed and are negative except what is noted above in HPI  Physical Exam: BP (!) 153/82   Pulse 85   Temp (!) 97.2 F (36.2 C)   Ht _0  (1.88 m)   Wt 280 lb (127 kg)   BMI 35.95 kg/m   Constitutional:  Alert and oriented, No acute distress. HEENT: [place] AT, moist mucus membranes.  Trachea midline, no masses. Cardiovascular: No clubbing, cyanosis, or edema. Respiratory: Normal respiratory effort, no increased work of breathing. GI: Abdomen is soft, nontender, nondistended, no abdominal masses GU: No CVA tenderness Lymph: No cervical or inguinal lymphadenopathy. Skin: No rashes, bruises or suspicious lesions. Neurologic: Grossly intact, no focal deficits, moving all 4 extremities. Psychiatric: Normal mood and affect.  Laboratory Data: Lab Results  Component Value Date   WBC 7.5 05/06/2015  HGB 15.8 05/06/2015   HCT 46.9 05/06/2015   MCV 90.0 05/06/2015   PLT 203 05/06/2015    Lab Results  Component Value Date   CREATININE 1.46 (H) 10/31/2019    Lab Results  Component Value Date   PSA 2.3 12/06/2014    No results found for: TESTOSTERONE  Lab Results  Component Value Date   HGBA1C 7.8 (H) 08/20/2019    Urinalysis No results found for: COLORURINE, APPEARANCEUR, LABSPEC, PHURINE, GLUCOSEU, HGBUR, BILIRUBINUR, KETONESUR, PROTEINUR, UROBILINOGEN, NITRITE, LEUKOCYTESUR  Lab Results  Component Value Date   LABMICR 14.2 08/08/2018    Pertinent Imaging: CT abd pelvis:possible evidence of metastatic disease at L5. Bone scan shows no evidence of metastatic disease at L5. Images  reviewed and discussed with the patient.  No results found for this or any previous visit. No results found for this or any previous visit. No results found for this or any previous visit. No results found for this or any previous visit. No results found for this or any previous visit. No results found for this or any previous visit. No results found for this or any previous visit. No results found for this or any previous visit.  Assessment & Plan:    1. Prostate cancer [facility]) -Patient to continue with robotic prostatectomy on 4/21.    No follow-ups on file.  Nicolette Bang, MD  [facility] [place]

## 2019-12-05 NOTE — Patient Instructions (Signed)
Prostate Cancer  The prostate is a male gland that helps make semen. Prostate cancer is when abnormal cells grow in this gland. Follow these instructions at home:  Take over-the-counter and prescription medicines only as told by your doctor.  Eat a healthy diet.  Get plenty of sleep.  Ask your doctor for help to find a support group for men with prostate cancer.  Keep all follow-up visits as told by your doctor. This is important.  If you have to go to the hospital, let your cancer doctor (oncologist) know.  Touch, hold, hug, and caress your partner to continue to show sexual feelings. Contact a doctor if:  You have trouble peeing (urinating).  You have blood in your pee (urine).  You have pain in your hips, back, or chest. Get help right away if:  You have weakness in your legs.  You lose feeling (have numbness) in your legs.  You cannot control your pee or your poop (stool).  You have trouble breathing.  You have sudden pain in your chest.  You have chills or a fever. Summary  The prostate is a male gland that helps make semen. Prostate cancer is when abnormal cells grow in this gland.  Ask your doctor for help to find a support group for men with prostate cancer.  Contact a doctor if you have problems peeing or have any new pain that you did not have before. This information is not intended to replace advice given to you by your health care provider. Make sure you discuss any questions you have with your health care provider. Document Revised: 09/02/2017 Document Reviewed: 05/31/2016 Elsevier Patient Education  2020 Elsevier Inc.  

## 2019-12-05 NOTE — Progress Notes (Signed)

## 2019-12-15 NOTE — Telephone Encounter (Signed)
Colonoscopy with conscious sedation 

## 2019-12-16 ENCOUNTER — Other Ambulatory Visit: Payer: Self-pay | Admitting: Family Medicine

## 2019-12-17 NOTE — Telephone Encounter (Signed)
May have 90-day follow-up within 3 months

## 2020-01-01 ENCOUNTER — Other Ambulatory Visit: Payer: Self-pay

## 2020-01-01 ENCOUNTER — Other Ambulatory Visit (HOSPITAL_COMMUNITY)
Admission: RE | Admit: 2020-01-01 | Discharge: 2020-01-01 | Disposition: A | Discharge status: Home / Self Care | Payer: Medicare HMO | Source: Ambulatory Visit | Attending: Internal Medicine | Admitting: Internal Medicine

## 2020-01-01 DIAGNOSIS — Z20822 Contact with and (suspected) exposure to covid-19: Secondary | ICD-10-CM | POA: Diagnosis not present

## 2020-01-01 DIAGNOSIS — Z01812 Encounter for preprocedural laboratory examination: Secondary | ICD-10-CM | POA: Diagnosis not present

## 2020-01-01 LAB — SARS CORONAVIRUS 2 (TAT 6-24 HRS): SARS Coronavirus 2: NEGATIVE

## 2020-01-02 ENCOUNTER — Other Ambulatory Visit: Payer: Self-pay | Admitting: Family Medicine

## 2020-01-02 NOTE — Telephone Encounter (Signed)
Please schedule and then route back to nurses to send in refill °

## 2020-01-02 NOTE — Telephone Encounter (Signed)
Last med check up 08/14/19

## 2020-01-02 NOTE — Telephone Encounter (Signed)
May have 90-day needs to follow-up visit by June

## 2020-01-02 NOTE — Telephone Encounter (Signed)
Scheduled 6/8

## 2020-01-03 ENCOUNTER — Other Ambulatory Visit: Payer: Self-pay

## 2020-01-03 ENCOUNTER — Ambulatory Visit (HOSPITAL_COMMUNITY)
Admission: RE | Admit: 2020-01-03 | Discharge: 2020-01-03 | Disposition: A | Discharge status: Home / Self Care | Payer: Medicare HMO | Attending: Internal Medicine | Admitting: Internal Medicine

## 2020-01-03 ENCOUNTER — Encounter (HOSPITAL_COMMUNITY): Payer: Self-pay | Admitting: Internal Medicine

## 2020-01-03 ENCOUNTER — Encounter (HOSPITAL_COMMUNITY)
Admission: RE | Disposition: A | Discharge status: Home / Self Care | Payer: Self-pay | Source: Home / Self Care | Attending: Internal Medicine

## 2020-01-03 DIAGNOSIS — E785 Hyperlipidemia, unspecified: Secondary | ICD-10-CM | POA: Diagnosis not present

## 2020-01-03 DIAGNOSIS — Z7982 Long term (current) use of aspirin: Secondary | ICD-10-CM | POA: Diagnosis not present

## 2020-01-03 DIAGNOSIS — Z8546 Personal history of malignant neoplasm of prostate: Secondary | ICD-10-CM | POA: Insufficient documentation

## 2020-01-03 DIAGNOSIS — Z7984 Long term (current) use of oral hypoglycemic drugs: Secondary | ICD-10-CM | POA: Insufficient documentation

## 2020-01-03 DIAGNOSIS — Z1211 Encounter for screening for malignant neoplasm of colon: Secondary | ICD-10-CM | POA: Insufficient documentation

## 2020-01-03 DIAGNOSIS — I10 Essential (primary) hypertension: Secondary | ICD-10-CM | POA: Diagnosis not present

## 2020-01-03 DIAGNOSIS — D122 Benign neoplasm of ascending colon: Secondary | ICD-10-CM | POA: Insufficient documentation

## 2020-01-03 DIAGNOSIS — E119 Type 2 diabetes mellitus without complications: Secondary | ICD-10-CM | POA: Insufficient documentation

## 2020-01-03 DIAGNOSIS — D123 Benign neoplasm of transverse colon: Secondary | ICD-10-CM | POA: Diagnosis not present

## 2020-01-03 DIAGNOSIS — Z79899 Other long term (current) drug therapy: Secondary | ICD-10-CM | POA: Insufficient documentation

## 2020-01-03 HISTORY — PX: COLONOSCOPY: SHX5424

## 2020-01-03 HISTORY — PX: POLYPECTOMY: SHX5525

## 2020-01-03 LAB — GLUCOSE, CAPILLARY: Glucose-Capillary: 124 mg/dL — ABNORMAL HIGH (ref 70–99)

## 2020-01-03 SURGERY — COLONOSCOPY
Anesthesia: Moderate Sedation

## 2020-01-03 MED ORDER — MEPERIDINE HCL 50 MG/ML IJ SOLN
INTRAMUSCULAR | Status: DC | PRN
  Administered 2020-01-03 (×2): 25 mg via INTRAVENOUS

## 2020-01-03 MED ORDER — MIDAZOLAM HCL 5 MG/5ML IJ SOLN
INTRAMUSCULAR | Status: DC | PRN
  Administered 2020-01-03: 2 mg via INTRAVENOUS
  Administered 2020-01-03 (×3): 1 mg via INTRAVENOUS
  Administered 2020-01-03: 2 mg via INTRAVENOUS
  Administered 2020-01-03 (×2): 1 mg via INTRAVENOUS

## 2020-01-03 MED ORDER — MIDAZOLAM HCL 5 MG/5ML IJ SOLN
INTRAMUSCULAR | Status: AC
  Filled 2020-01-03: qty 10

## 2020-01-03 MED ORDER — STERILE WATER FOR IRRIGATION IR SOLN
Status: DC | PRN
  Administered 2020-01-03: 1.5 mL

## 2020-01-03 MED ORDER — ASPIRIN EC 81 MG PO TBEC: 81.0000 mg | DELAYED_RELEASE_TABLET | Freq: Every day | ORAL | 6 refills | Status: DC

## 2020-01-03 MED ORDER — SODIUM CHLORIDE 0.9 % IV SOLN
INTRAVENOUS | Status: DC
  Administered 2020-01-03: 1000 mL via INTRAVENOUS

## 2020-01-03 MED ORDER — MEPERIDINE HCL 50 MG/ML IJ SOLN
INTRAMUSCULAR | Status: AC
  Filled 2020-01-03: qty 1

## 2020-01-03 NOTE — H&P (Signed)
Adam Lee. is an 64 y.o. male.   Chief Complaint: Patient is here for colonoscopy. HPI: Patient 64 year old Caucasian male who is here for screening colonoscopy.  He denies abdominal pain change in bowel habits or rectal bleeding.  This is patient's first exam. Family history is negative for CRC. Last aspirin dose was 2 days ago.  Past Medical History:  Diagnosis Date  . Diabetes mellitus without complication ([place])   . History of stress test 2014   intermediate risk  . Hyperlipemia   . Hypertension   . Prostate cancer [facility])     Past Surgical History:  Procedure Laterality Date  . HERNIA REPAIR Left    2000, Dr. Aviva Signs    Family History  Problem Relation Age of Onset  . Diabetes Father   . Heart disease Mother   . Cancer Other   . Breast cancer Neg Hx   . Prostate cancer Neg Hx   . Colon cancer Neg Hx   . Pancreatic cancer Neg Hx    Social History:  reports that he has never smoked. He has never used smokeless tobacco. He reports that he does not drink alcohol or use drugs.  Allergies: No Known Allergies  Medications Prior to Admission  Medication Sig Dispense Refill  . aspirin EC 81 MG tablet Take 1 tablet (81 mg total) by mouth daily. 30 tablet 6  . lisinopril (ZESTRIL) 10 MG tablet TAKE 1 TABLET BY MOUTH EVERY DAY (Patient taking differently: Take 10 mg by mouth daily. ) 30 tablet 3  . metFORMIN (GLUCOPHAGE) 500 MG tablet TAKE 2 TABLETS BY MOUTH TWICE A DAY (Patient taking differently: Take 1,000 mg by mouth daily. ) 360 tablet 0  . Omega-3 Fatty Acids (FISH OIL) 1000 MG CAPS Take 1,000 mg by mouth daily.     Marland Kitchen atorvastatin (LIPITOR) 10 MG tablet TAKE 1 TABLET BY MOUTH EVERY DAY 90 tablet 0  . blood glucose meter kit and supplies Dispense based on patient and insurance preference. Use to check sugars once a day (FOR ICD-10 E10.9, E11.9). 1 each 0  . Lancets (ONETOUCH DELICA PLUS [id number]) MISC USE TO OBTAIN A BLOOD SPECIMEN TO TEST BLOOD SUGAR DAILY 100  each 0  . naproxen sodium (ALEVE) 220 MG tablet Take 220 mg by mouth daily as needed (pain).    . nitroGLYCERIN (NITROSTAT) 0.4 MG SL tablet Place 1 tablet (0.4 mg total) under the tongue every 5 (five) minutes as needed for chest pain. 100 tablet 1  . ONETOUCH ULTRA test strip USE TO TEST BLOOD SUGAR DAILY 100 strip 0    No results found for this or any previous visit (from the past 48 hour(s)). No results found.  Review of Systems  Blood pressure (!) 150/86, pulse 80, temperature 98.9 F (37.2 C), temperature source Oral, resp. rate 18, height 6' 2"  (1.88 m), weight 129.3 kg, SpO2 96 %. Physical Exam  Constitutional: He appears well-developed and well-nourished.  HENT:  Mouth/Throat: Oropharynx is clear and moist.  Eyes: Conjunctivae are normal. No scleral icterus.  Neck: No thyromegaly present.  Cardiovascular: Normal rate, regular rhythm and normal heart sounds.  No murmur heard. Respiratory: Effort normal and breath sounds normal.  GI:  Abdomen is full with small umbilical hernia.  On palpation abdomen is soft and nontender with organomegaly or masses.  Musculoskeletal:        General: No edema.  Lymphadenopathy:    He has no cervical adenopathy.  Neurological: He is alert.  Skin: Skin is warm and dry.     Assessment/Plan Average risk screening colonoscopy.  Hildred Laser, MD 01/03/2020, 10:15 AM

## 2020-01-03 NOTE — Discharge Instructions (Signed)
No aspirin or NSAIDs for 3 days. Resume other medications and diet as before. No driving for 24 hours. Physician will call with biopsy results.   Colonoscopy, Adult, Care After This sheet gives you information about how to care for yourself after your procedure. Your doctor may also give you more specific instructions. If you have problems or questions, call your doctor. What can I expect after the procedure? After the procedure, it is common to have:  A small amount of blood in your poop (stool) for 24 hours.  Some gas.  Mild cramping or bloating in your belly (abdomen). Follow these instructions at home: Eating and drinking   Drink enough fluid to keep your pee (urine) pale yellow.  Follow instructions from your doctor about what you cannot eat or drink.  Return to your normal diet as told by your doctor. Avoid heavy or fried foods that are hard to digest. Activity  Rest as told by your doctor.  Do not sit for a long time without moving. Get up to take short walks every 1-2 hours. This is important. Ask for help if you feel weak or unsteady.  Return to your normal activities as told by your doctor. Ask your doctor what activities are safe for you. To help cramping and bloating:   Try walking around.  Put heat on your belly as told by your doctor. Use the heat source that your doctor recommends, such as a moist heat pack or a heating pad. ? Put a towel between your skin and the heat source. ? Leave the heat on for 20-30 minutes. ? Remove the heat if your skin turns bright red. This is very important if you are unable to feel pain, heat, or cold. You may have a greater risk of getting burned. General instructions  For the first 24 hours after the procedure: ? Do not drive or use machinery. ? Do not sign important documents. ? Do not drink alcohol. ? Do your daily activities more slowly than normal. ? Eat foods that are soft and easy to digest.  Take over-the-counter  or prescription medicines only as told by your doctor.  Keep all follow-up visits as told by your doctor. This is important. Contact a doctor if:  You have blood in your poop 2-3 days after the procedure. Get help right away if:  You have more than a small amount of blood in your poop.  You see large clumps of tissue (blood clots) in your poop.  Your belly is swollen.  You feel like you may vomit (nauseous).  You vomit.  You have a fever.  You have belly pain that gets worse, and medicine does not help your pain. Summary  After the procedure, it is common to have a small amount of blood in your poop. You may also have mild cramping and bloating in your belly.  For the first 24 hours after the procedure, do not drive or use machinery, do not sign important documents, and do not drink alcohol.  Get help right away if you have a lot of blood in your poop, feel like you may vomit, have a fever, or have more belly pain. This information is not intended to replace advice given to you by your health care provider. Make sure you discuss any questions you have with your health care provider. Document Revised: 04/16/2019 Document Reviewed: 04/16/2019 Elsevier Patient Education  [place].  Colon Polyps  Polyps are tissue growths inside the body. Polyps can  grow in many places, including the large intestine (colon). A polyp may be a round bump or a mushroom-shaped growth. You could have one polyp or several. Most colon polyps are noncancerous (benign). However, some colon polyps can become cancerous over time. Finding and removing the polyps early can help prevent this. What are the causes? The exact cause of colon polyps is not known. What increases the risk? You are more likely to develop this condition if you:  Have a family history of colon cancer or colon polyps.  Are older than 38 or older than 45 if you are African American.  Have inflammatory bowel disease, such as  ulcerative colitis or Crohn's disease.  Have certain hereditary conditions, such as: ? Familial adenomatous polyposis. ? Lynch syndrome. ? Turcot syndrome. ? Peutz-Jeghers syndrome.  Are overweight.  Smoke cigarettes.  Do not get enough exercise.  Drink too much alcohol.  Eat a diet that is high in fat and red meat and low in fiber.  Had childhood cancer that was treated with abdominal radiation. What are the signs or symptoms? Most polyps do not cause symptoms. If you have symptoms, they may include:  Blood coming from your rectum when having a bowel movement.  Blood in your stool. The stool may look dark red or black.  Abdominal pain.  A change in bowel habits, such as constipation or diarrhea. How is this diagnosed? This condition is diagnosed with a colonoscopy. This is a procedure in which a lighted, flexible scope is inserted into the anus and then passed into the colon to examine the area. Polyps are sometimes found when a colonoscopy is done as part of routine cancer screening tests. How is this treated? Treatment for this condition involves removing any polyps that are found. Most polyps can be removed during a colonoscopy. Those polyps will then be tested for cancer. Additional treatment may be needed depending on the results of testing. Follow these instructions at home: Lifestyle  Maintain a healthy weight, or lose weight if recommended by your health care provider.  Exercise every day or as told by your health care provider.  Do not use any products that contain nicotine or tobacco, such as cigarettes and e-cigarettes. If you need help quitting, ask your health care provider.  If you drink alcohol, limit how much you have: ? 0-1 drink a day for women. ? 0-2 drinks a day for men.  Be aware of how much alcohol is in your drink. In the [place] one drink equals one 12 oz bottle of beer (355 mL), one 5 oz glass of wine (148 mL), or one 1 oz shot of hard liquor  (44 mL). Eating and drinking   Eat foods that are high in fiber, such as fruits, vegetables, and whole grains.  Eat foods that are high in calcium and vitamin D, such as milk, cheese, yogurt, eggs, liver, fish, and broccoli.  Limit foods that are high in fat, such as fried foods and desserts.  Limit the amount of red meat and processed meat you eat, such as hot dogs, sausage, bacon, and lunch meats. General instructions  Keep all follow-up visits as told by your health care provider. This is important. ? This includes having regularly scheduled colonoscopies. ? Talk to your health care provider about when you need a colonoscopy. Contact a health care provider if:  You have new or worsening bleeding during a bowel movement.  You have new or increased blood in your stool.  You have  a change in bowel habits.  You lose weight for no known reason. Summary  Polyps are tissue growths inside the body. Polyps can grow in many places, including the colon.  Most colon polyps are noncancerous (benign), but some can become cancerous over time.  This condition is diagnosed with a colonoscopy.  Treatment for this condition involves removing any polyps that are found. Most polyps can be removed during a colonoscopy. This information is not intended to replace advice given to you by your health care provider. Make sure you discuss any questions you have with your health care provider. Document Revised: 01/05/2018 Document Reviewed: 01/05/2018 Elsevier Patient Education  [place].

## 2020-01-03 NOTE — Op Note (Signed)
[facility] Patient Name: Adam Lee Procedure Date: 01/03/2020 10:07 AM MRN: [id number] Date of Birth: 05/14/1956 Attending MD: Hildred Laser , MD CSN: [id number] Age: 64 Admit Type: Outpatient Procedure:                Colonoscopy Indications:              Screening for colorectal malignant neoplasm Providers:                Hildred Laser, MD, Otis Peak B. Sharon Seller, RN, Nelma Rothman, Technician Referring MD:              Medicines:                Meperidine 50 mg IV, Midazolam 9 mg IV Complications:            No immediate complications. Estimated Blood Loss:     Estimated blood loss: none. Procedure:                Pre-Anesthesia Assessment:                           - Prior to the procedure, a History and Physical                            was performed, and patient medications and                            allergies were reviewed. The patient's tolerance of                            previous anesthesia was also reviewed. The risks                            and benefits of the procedure and the sedation                            options and risks were discussed with the patient.                            All questions were answered, and informed consent                            was obtained. Prior Anticoagulants: The patient has                            taken no previous anticoagulant or antiplatelet                            agents except for aspirin. ASA Grade Assessment: II                            - A patient with mild systemic disease. After  reviewing the risks and benefits, the patient was                            deemed in satisfactory condition to undergo the                            procedure.                           After obtaining informed consent, the colonoscope                            was passed under direct vision. Throughout the                            procedure, the patient's blood  pressure, pulse, and                            oxygen saturations were monitored continuously. The                            PCF-[id number] scope was introduced through                            the anus and advanced to the the cecum, identified                            by the ileocecal valve. The colonoscopy was                            technically difficult and complex due to a                            redundant colon. The patient tolerated the                            procedure well. The quality of the bowel                            preparation was adequate. The ileocecal valve and                            the rectum were photographed. Scope In: 10:25:14 AM Scope Out: 11:17:52 AM Scope Withdrawal Time: 0 hours 27 minutes 54 seconds  Total Procedure Duration: 0 hours 52 minutes 38 seconds  Findings:      The perianal and digital rectal examinations were normal.      A 4 to 10 mm polyp was found in the distal ascending colon. The polyp       was sessile. Biopsies were taken with a cold forceps for histology.       Coagulation for destruction of remaining portion of lesion using argon       plasma was successful. The pathology specimen was placed into Bottle       Number 1.      Two polyps were  found in the hepatic flexure. The polyps were small in       size. These were biopsied with a cold forceps for histology. The       pathology specimen was placed into Bottle Number 1.      The exam was otherwise normal throughout the examined colon.      The retroflexed view of the distal rectum and anal verge was normal and       showed no anal or rectal abnormalities. Impression:               - Examination performed to cecum but appendiceal                            orifice not seen.                           - One 4 to 10 mm polyp in the distal ascending                            colon. Biopsied. Treated with argon plasma                            coagulation (APC).                            - Two small polyps at the hepatic flexure. Biopsied. Moderate Sedation:      Moderate (conscious) sedation was administered by the endoscopy nurse       and supervised by the endoscopist. The following parameters were       monitored: oxygen saturation, heart rate, blood pressure, CO2       capnography and response to care. Total physician intraservice time was       58 minutes. Recommendation:           - Patient has a contact number available for                            emergencies. The signs and symptoms of potential                            delayed complications were discussed with the                            patient. Return to normal activities tomorrow.                            Written discharge instructions were provided to the                            patient.                           - Resume previous diet today.                           - Continue present medications.                           -  No aspirin, ibuprofen, naproxen, or other                            non-steroidal anti-inflammatory drugs for 3 days.                           - Await pathology results.                           - Repeat colonoscopy is recommended. The                            colonoscopy date will be determined after pathology                            results from today's exam become available for                            review. Procedure Code(s):        --- Professional ---                           [id number], Colonoscopy, flexible; with ablation of                            tumor(s), polyp(s), or other lesion(s) (includes                            pre- and post-dilation and guide wire passage, when                            performed)                           45380, 59, Colonoscopy, flexible; with biopsy,                            single or multiple                           99153, Moderate sedation; each additional 15                             minutes intraservice time                           99153, Moderate sedation; each additional 15                            minutes intraservice time                           99153, Moderate sedation; each additional 15                            minutes intraservice time  G0500, Moderate sedation services provided by the                            same physician or other qualified health care                            professional performing a gastrointestinal                            endoscopic service that sedation supports,                            requiring the presence of an independent trained                            observer to assist in the monitoring of the                            patient's level of consciousness and physiological                            status; initial 15 minutes of intra-service time;                            patient age 36 years or older (additional time may                            be reported with [phone], as appropriate) Diagnosis Code(s):        --- Professional ---                           K63.5, Polyp of colon                           Z12.11, Encounter for screening for malignant                            neoplasm of colon CPT copyright 2019 American [employer] Association. All rights reserved. The codes documented in this report are preliminary and upon coder review may  be revised to meet current compliance requirements. Hildred Laser, MD Hildred Laser, MD 01/03/2020 11:38:23 AM This report has been signed electronically. Number of Addenda: 0

## 2020-01-04 LAB — SURGICAL PATHOLOGY

## 2020-01-09 DIAGNOSIS — M6281 Muscle weakness (generalized): Secondary | ICD-10-CM | POA: Diagnosis not present

## 2020-01-09 DIAGNOSIS — M62838 Other muscle spasm: Secondary | ICD-10-CM | POA: Diagnosis not present

## 2020-01-09 DIAGNOSIS — C61 Malignant neoplasm of prostate: Secondary | ICD-10-CM | POA: Diagnosis not present

## 2020-01-09 DIAGNOSIS — M6289 Other specified disorders of muscle: Secondary | ICD-10-CM | POA: Diagnosis not present

## 2020-01-14 NOTE — Patient Instructions (Addendum)
DUE TO COVID-19 ONLY ONE VISITOR IS ALLOWED TO COME WITH YOU AND STAY IN THE WAITING ROOM ONLY DURING PRE OP AND PROCEDURE DAY OF SURGERY. THE 1 VISITOR MAY VISIT WITH YOU AFTER SURGERY IN YOUR PRIVATE ROOM DURING VISITING HOURS ONLY!  YOU NEED TO HAVE A COVID 19 TEST ON_   Sat 4/17__@___10 :20____, THIS TEST MUST BE DONE BEFORE SURGERY, COME  [address] [place] , [address].  ([place]) ONCE YOUR COVID TEST IS COMPLETED, PLEASE BEGIN THE QUARANTINE INSTRUCTIONS AS OUTLINED IN YOUR HANDOUT.                [name].    Your procedure is scheduled on: 01/23/20   Report to [facility] Main  Entrance   Report to admitting at  7:00 AM     Call this number if you have problems the morning of surgery [phone]    Remember: Do not eat food or drink liquids :After Midnight.   BRUSH YOUR TEETH MORNING OF SURGERY AND RINSE YOUR MOUTH OUT, NO CHEWING GUM CANDY OR MINTS.     Take these medicines the morning of surgery with A SIP OF WATER: None. Follow instructions for bowel pre from Dr. Zettie Pho office.  DO NOT TAKE ANY DIABETIC MEDICATIONS DAY OF YOUR SURGERY       How to Manage Your Diabetes Before and After Surgery  Why is it important to control my blood sugar before and after surgery? . Improving blood sugar levels before and after surgery helps healing and can limit problems. . A way of improving blood sugar control is eating a healthy diet by: o  Eating less sugar and carbohydrates o  Increasing activity/exercise o  Talking with your doctor about reaching your blood sugar goals . High blood sugars (greater than 180 mg/dL) can raise your risk of infections and slow your recovery, so you will need to focus on controlling your diabetes during the weeks before surgery. . Make sure that the doctor who takes care of your diabetes knows about your planned surgery including the date and location.  How do I manage my blood sugar before  surgery? . Check your blood sugar at least 4 times a day, starting 2 days before surgery, to make sure that the level is not too high or low. o Check your blood sugar the morning of your surgery when you wake up and every 2 hours until you get to the Short Stay unit. . If your blood sugar is less than 70 mg/dL, you will need to treat for low blood sugar: o Do not take insulin. o Treat a low blood sugar (less than 70 mg/dL) with  cup of clear juice (cranberry or apple), 4 glucose tablets, OR glucose gel. o Recheck blood sugar in 15 minutes after treatment (to make sure it is greater than 70 mg/dL). If your blood sugar is not greater than 70 mg/dL on recheck, call [phone] for further instructions. . Report your blood sugar to the short stay nurse when you get to Short Stay.  . If you are admitted to the hospital after surgery: o Your blood sugar will be checked by the staff and you will probably be given insulin after surgery (instead of oral diabetes medicines) to make sure you have good blood sugar levels. o The goal for blood sugar control after surgery is 80-180 mg/dL.   WHAT DO I DO ABOUT MY DIABETES MEDICATION?  Marland Kitchen Do not take oral  diabetes medicines (pills) the morning of surgery.  .                     You may not have any metal on your body including              piercings  Do not wear jewelry,  lotions, powders or  deodorant                    Men may shave face and neck.   Do not bring valuables to the hospital. [place].  Contacts, dentures or bridgework may not be worn into surgery.     Special Instructions: N/A              Please read over the following fact sheets you were given: _____________________________________________________________________             [facility] - Preparing for Surgery Before surgery, you can play an important role.   Because skin is not sterile, your skin needs to be as free of  germs as possible.    You can reduce the number of germs on your skin by washing with CHG (chlorahexidine gluconate) soap before surgery.  CHG is an antiseptic cleaner which kills germs and bonds with the skin to continue killing germs even after washing. Please DO NOT use if you have an allergy to CHG or antibacterial soaps.  If your skin becomes reddened/irritated stop using the CHG and inform your nurse when you arrive at Short Stay. r.  You may shave your face/neck.  Please follow these instructions carefully:  1.  Shower with CHG Soap the night before surgery and the  morning of Surgery.  2.  If you choose to wash your hair, wash your hair first as usual with your  normal  shampoo.  3.  After you shampoo, rinse your hair and body thoroughly to remove the  shampoo.                                        4.  Use CHG as you would any other liquid soap.  You can apply chg directly  to the skin and wash                       Gently with a scrungie or clean washcloth.  5.  Apply the CHG Soap to your body ONLY FROM THE NECK DOWN.   Do not use on face/ open                           Wound or open sores. Avoid contact with eyes, ears mouth and genitals (private parts).                       Wash face,  Genitals (private parts) with your normal soap.             6.  Wash thoroughly, paying special attention to the area where your surgery  will be performed.  7.  Thoroughly rinse your body with warm water from the neck down.  8.  DO NOT shower/wash with your normal soap after using and rinsing off  the CHG Soap.  9.  Pat yourself dry with a clean towel.            10.  Wear clean pajamas.            11.  Place clean sheets on your bed the night of your first shower and do not  sleep with pets. Day of Surgery : Do not apply any lotions/deodorants the morning of surgery.  Please wear clean clothes to the hospital/surgery center.  FAILURE TO FOLLOW THESE INSTRUCTIONS MAY RESULT IN THE  CANCELLATION OF YOUR SURGERY PATIENT SIGNATURE_________________________________  NURSE SIGNATURE__________________________________  ________________________________________________________________________

## 2020-01-15 ENCOUNTER — Encounter (HOSPITAL_COMMUNITY)
Admission: RE | Admit: 2020-01-15 | Discharge: 2020-01-15 | Disposition: A | Discharge status: Home / Self Care | Payer: Medicare HMO | Source: Ambulatory Visit | Attending: Urology | Admitting: Urology

## 2020-01-15 ENCOUNTER — Other Ambulatory Visit: Payer: Self-pay

## 2020-01-15 ENCOUNTER — Encounter (HOSPITAL_COMMUNITY): Payer: Self-pay

## 2020-01-15 DIAGNOSIS — R9431 Abnormal electrocardiogram [ECG] [EKG]: Secondary | ICD-10-CM | POA: Diagnosis not present

## 2020-01-15 DIAGNOSIS — Z01818 Encounter for other preprocedural examination: Secondary | ICD-10-CM | POA: Insufficient documentation

## 2020-01-15 DIAGNOSIS — E119 Type 2 diabetes mellitus without complications: Secondary | ICD-10-CM | POA: Insufficient documentation

## 2020-01-15 HISTORY — DX: Cardiac arrhythmia, unspecified: I49.9

## 2020-01-15 LAB — BASIC METABOLIC PANEL
Anion gap: 9 (ref 5–15)
BUN: 23 mg/dL (ref 8–23)
CO2: 26 mmol/L (ref 22–32)
Calcium: 9.3 mg/dL (ref 8.9–10.3)
Chloride: 102 mmol/L (ref 98–111)
Creatinine, Ser: 1.22 mg/dL (ref 0.61–1.24)
GFR calc Af Amer: >60 mL/min (ref >60)
GFR calc non Af Amer: >60 mL/min (ref >60)
Glucose, Bld: 174 mg/dL — ABNORMAL HIGH (ref 70–99)
Potassium: 4.3 mmol/L (ref 3.5–5.1)
Sodium: 137 mmol/L (ref 135–145)

## 2020-01-15 LAB — CBC
HCT: 49.9 % (ref 39.0–52.0)
Hemoglobin: 15.8 g/dL (ref 13.0–17.0)
MCH: 29.2 pg (ref 26.0–34.0)
MCHC: 31.7 g/dL (ref 30.0–36.0)
MCV: 92.1 fL (ref 80.0–100.0)
Platelets: 208 10*3/uL (ref 150–400)
RBC: 5.42 MIL/uL (ref 4.22–5.81)
RDW: 12.8 % (ref 11.5–15.5)
WBC: 7.1 10*3/uL (ref 4.0–10.5)
nRBC: 0 % (ref 0.0–0.2)

## 2020-01-15 LAB — GLUCOSE, CAPILLARY: Glucose-Capillary: 178 mg/dL — ABNORMAL HIGH (ref 70–99)

## 2020-01-15 NOTE — Progress Notes (Addendum)
PCP - Dr. Lance Sell Cardiologist - Nishan  Chest x-ray - no EKG - 01/15/20 Stress Test - no ECHO - 2014. Pt had a work up for chest pain and change of rhythm.Findings were negative. stress related Cardiac Cath - no  Sleep Study -  CPAP -   Fasting Blood Sugar - 120-130 Checks Blood Sugar _____ times a day. 2 times a week.  Blood Thinner Instructions:ASA Aspirin Instructions:stop 7 days prior to DOS Last Dose:01/16/20  Anesthesia review:   Patient denies shortness of breath, fever, cough and chest pain at PAT appointment  yes Patient verbalized understanding of instructions that were given to them at the PAT appointment. Patient was also instructed that they will need to review over the PAT instructions again at home before surgery. yes

## 2020-01-16 LAB — HEMOGLOBIN A1C
Hgb A1c MFr Bld: 7.9 % — ABNORMAL HIGH (ref 4.8–5.6)
Mean Plasma Glucose: 180 mg/dL

## 2020-01-19 ENCOUNTER — Other Ambulatory Visit (HOSPITAL_COMMUNITY)
Admission: RE | Admit: 2020-01-19 | Discharge: 2020-01-19 | Disposition: A | Discharge status: Home / Self Care | Payer: Medicare HMO | Source: Ambulatory Visit | Attending: Urology | Admitting: Urology

## 2020-01-19 DIAGNOSIS — Z20822 Contact with and (suspected) exposure to covid-19: Secondary | ICD-10-CM | POA: Diagnosis not present

## 2020-01-19 DIAGNOSIS — Z01812 Encounter for preprocedural laboratory examination: Secondary | ICD-10-CM | POA: Diagnosis not present

## 2020-01-19 LAB — SARS CORONAVIRUS 2 (TAT 6-24 HRS): SARS Coronavirus 2: NEGATIVE

## 2020-01-22 MED ORDER — DEXTROSE 5 % IV SOLN
3.0000 g | INTRAVENOUS | Status: AC
  Administered 2020-01-23: 3 g via INTRAVENOUS
  Filled 2020-01-22: qty 3

## 2020-01-22 NOTE — Anesthesia Preprocedure Evaluation (Addendum)
Anesthesia Evaluation  Patient identified by MRN, date of birth, ID band Patient awake    Reviewed: Allergy & Precautions, H&P , NPO status , Patient's Chart, lab work & pertinent test results  Airway Mallampati: I  TM Distance: >3 FB Neck ROM: Full    Dental no notable dental hx. (+) Teeth Intact, Dental Advisory Given   Pulmonary neg pulmonary ROS,    Pulmonary exam normal breath sounds clear to auscultation       Cardiovascular Exercise Tolerance: Good hypertension, Pt. on medications  Rhythm:Regular Rate:Normal     Neuro/Psych negative neurological ROS  negative psych ROS   GI/Hepatic negative GI ROS, Neg liver ROS,   Endo/Other  diabetes, Type 2, Oral Hypoglycemic AgentsMorbid obesity  Renal/GU negative Renal ROS  negative genitourinary   Musculoskeletal   Abdominal   Peds  Hematology negative hematology ROS (+)   Anesthesia Other Findings   Reproductive/Obstetrics negative OB ROS                            Anesthesia Physical Anesthesia Plan  ASA: III  Anesthesia Plan: General   Post-op Pain Management:    Induction: Intravenous  PONV Risk Score and Plan: 3 and Ondansetron, Dexamethasone and Midazolam  Airway Management Planned: Oral ETT  Additional Equipment:   Intra-op Plan:   Post-operative Plan: Extubation in OR  Informed Consent: I have reviewed the patients History and Physical, chart, labs and discussed the procedure including the risks, benefits and alternatives for the proposed anesthesia with the patient or authorized representative who has indicated his/her understanding and acceptance.     Dental advisory given  Plan Discussed with: CRNA  Anesthesia Plan Comments:         Anesthesia Quick Evaluation

## 2020-01-23 ENCOUNTER — Encounter (HOSPITAL_COMMUNITY)
Admission: RE | Disposition: A | Discharge status: Home / Self Care | Payer: Self-pay | Source: Home / Self Care | Attending: Urology

## 2020-01-23 ENCOUNTER — Other Ambulatory Visit: Payer: Self-pay

## 2020-01-23 ENCOUNTER — Encounter (HOSPITAL_COMMUNITY): Payer: Self-pay | Admitting: Urology

## 2020-01-23 ENCOUNTER — Ambulatory Visit (HOSPITAL_COMMUNITY): Payer: Medicare HMO

## 2020-01-23 ENCOUNTER — Observation Stay (HOSPITAL_COMMUNITY)
Admission: RE | Admit: 2020-01-23 | Discharge: 2020-01-24 | Disposition: A | Discharge status: Home / Self Care | Payer: Medicare HMO | Attending: Urology | Admitting: Urology

## 2020-01-23 ENCOUNTER — Encounter: Payer: Self-pay | Admitting: Medical Oncology

## 2020-01-23 DIAGNOSIS — Z794 Long term (current) use of insulin: Secondary | ICD-10-CM | POA: Insufficient documentation

## 2020-01-23 DIAGNOSIS — I10 Essential (primary) hypertension: Secondary | ICD-10-CM | POA: Insufficient documentation

## 2020-01-23 DIAGNOSIS — Z6836 Body mass index (BMI) 36.0-36.9, adult: Secondary | ICD-10-CM | POA: Insufficient documentation

## 2020-01-23 DIAGNOSIS — Z79899 Other long term (current) drug therapy: Secondary | ICD-10-CM | POA: Diagnosis not present

## 2020-01-23 DIAGNOSIS — E785 Hyperlipidemia, unspecified: Secondary | ICD-10-CM | POA: Diagnosis not present

## 2020-01-23 DIAGNOSIS — Q631 Lobulated, fused and horseshoe kidney: Secondary | ICD-10-CM | POA: Diagnosis not present

## 2020-01-23 DIAGNOSIS — E119 Type 2 diabetes mellitus without complications: Secondary | ICD-10-CM | POA: Insufficient documentation

## 2020-01-23 DIAGNOSIS — C61 Malignant neoplasm of prostate: Principal | ICD-10-CM | POA: Insufficient documentation

## 2020-01-23 DIAGNOSIS — Z7984 Long term (current) use of oral hypoglycemic drugs: Secondary | ICD-10-CM | POA: Diagnosis not present

## 2020-01-23 HISTORY — PX: LYMPHADENECTOMY: SHX5960

## 2020-01-23 HISTORY — PX: ROBOT ASSISTED LAPAROSCOPIC RADICAL PROSTATECTOMY: SHX5141

## 2020-01-23 LAB — GLUCOSE, CAPILLARY
Glucose-Capillary: 103 mg/dL — ABNORMAL HIGH (ref 70–99)
Glucose-Capillary: 164 mg/dL — ABNORMAL HIGH (ref 70–99)
Glucose-Capillary: 180 mg/dL — ABNORMAL HIGH (ref 70–99)

## 2020-01-23 LAB — HEMOGLOBIN AND HEMATOCRIT, BLOOD
HCT: 47.1 % (ref 39.0–52.0)
Hemoglobin: 14.9 g/dL (ref 13.0–17.0)

## 2020-01-23 SURGERY — PROSTATECTOMY, RADICAL, ROBOT-ASSISTED, LAPAROSCOPIC
Anesthesia: General | Site: Abdomen

## 2020-01-23 MED ORDER — SUGAMMADEX SODIUM 200 MG/2ML IV SOLN
INTRAVENOUS | Status: DC | PRN
  Administered 2020-01-23: 300 mg via INTRAVENOUS

## 2020-01-23 MED ORDER — LIDOCAINE 20MG/ML (2%) 15 ML SYRINGE OPTIME
INTRAMUSCULAR | Status: DC | PRN
  Administered 2020-01-23: 1.5 mg/kg/h via INTRAVENOUS

## 2020-01-23 MED ORDER — PROPOFOL 10 MG/ML IV BOLUS
INTRAVENOUS | Status: AC
  Filled 2020-01-23: qty 40

## 2020-01-23 MED ORDER — BACITRACIN-NEOMYCIN-POLYMYXIN 400-5-5000 EX OINT: 1.0000 "application " | TOPICAL_OINTMENT | Freq: Three times a day (TID) | CUTANEOUS | Status: DC | PRN

## 2020-01-23 MED ORDER — SODIUM CHLORIDE 0.45 % IV SOLN: INTRAVENOUS | Status: DC

## 2020-01-23 MED ORDER — INSULIN ASPART 100 UNIT/ML ~~LOC~~ SOLN
0.0000 [IU] | Freq: Three times a day (TID) | SUBCUTANEOUS | Status: DC
  Administered 2020-01-23 – 2020-01-24 (×3): 3 [IU] via SUBCUTANEOUS

## 2020-01-23 MED ORDER — LACTATED RINGERS IV SOLN: INTRAVENOUS | Status: DC | PRN

## 2020-01-23 MED ORDER — SULFAMETHOXAZOLE-TRIMETHOPRIM 800-160 MG PO TABS: 1.0000 | ORAL_TABLET | Freq: Two times a day (BID) | ORAL | 0 refills | Status: DC

## 2020-01-23 MED ORDER — DOCUSATE SODIUM 100 MG PO CAPS
100.0000 mg | ORAL_CAPSULE | Freq: Two times a day (BID) | ORAL | Status: DC
  Administered 2020-01-23 – 2020-01-24 (×2): 100 mg via ORAL
  Filled 2020-01-23 (×2): qty 1

## 2020-01-23 MED ORDER — ATORVASTATIN CALCIUM 10 MG PO TABS
10.0000 mg | ORAL_TABLET | Freq: Every day | ORAL | Status: DC
  Administered 2020-01-23 – 2020-01-24 (×2): 10 mg via ORAL
  Filled 2020-01-23 (×2): qty 1

## 2020-01-23 MED ORDER — ACETAMINOPHEN 500 MG PO TABS
1000.0000 mg | ORAL_TABLET | Freq: Four times a day (QID) | ORAL | Status: DC
  Administered 2020-01-23 – 2020-01-24 (×3): 1000 mg via ORAL
  Filled 2020-01-23 (×3): qty 2

## 2020-01-23 MED ORDER — HYDROMORPHONE HCL 1 MG/ML IJ SOLN: 0.5000 mg | INTRAMUSCULAR | Status: DC | PRN

## 2020-01-23 MED ORDER — CHLORHEXIDINE GLUCONATE CLOTH 2 % EX PADS
6.0000 | MEDICATED_PAD | Freq: Every day | CUTANEOUS | Status: DC
  Administered 2020-01-23 – 2020-01-24 (×2): 6 via TOPICAL

## 2020-01-23 MED ORDER — DIPHENHYDRAMINE HCL 50 MG/ML IJ SOLN: 12.5000 mg | Freq: Four times a day (QID) | INTRAMUSCULAR | Status: DC | PRN

## 2020-01-23 MED ORDER — MIDAZOLAM HCL 2 MG/2ML IJ SOLN
INTRAMUSCULAR | Status: AC
  Filled 2020-01-23: qty 2

## 2020-01-23 MED ORDER — LACTATED RINGERS IV SOLN: INTRAVENOUS | Status: DC

## 2020-01-23 MED ORDER — ONDANSETRON HCL 4 MG/2ML IJ SOLN: 4.0000 mg | INTRAMUSCULAR | Status: DC | PRN

## 2020-01-23 MED ORDER — FENTANYL CITRATE (PF) 250 MCG/5ML IJ SOLN
INTRAMUSCULAR | Status: AC
  Filled 2020-01-23: qty 5

## 2020-01-23 MED ORDER — PHENYLEPHRINE HCL-NACL 10-0.9 MG/250ML-% IV SOLN
INTRAVENOUS | Status: DC | PRN
  Administered 2020-01-23: 15 ug/min via INTRAVENOUS

## 2020-01-23 MED ORDER — HYDROCODONE-ACETAMINOPHEN 5-325 MG PO TABS: 1.0000 | ORAL_TABLET | Freq: Four times a day (QID) | ORAL | 0 refills | Status: DC | PRN

## 2020-01-23 MED ORDER — ONDANSETRON HCL 4 MG/2ML IJ SOLN
INTRAMUSCULAR | Status: AC
  Filled 2020-01-23: qty 2

## 2020-01-23 MED ORDER — HYDROMORPHONE HCL 1 MG/ML IJ SOLN: 0.2500 mg | INTRAMUSCULAR | Status: DC | PRN

## 2020-01-23 MED ORDER — LACTATED RINGERS IR SOLN
Status: DC | PRN
  Administered 2020-01-23: 1000 mL

## 2020-01-23 MED ORDER — PROPOFOL 10 MG/ML IV BOLUS
INTRAVENOUS | Status: DC | PRN
  Administered 2020-01-23: 120 mg via INTRAVENOUS

## 2020-01-23 MED ORDER — OXYCODONE HCL 5 MG PO TABS: 5.0000 mg | ORAL_TABLET | ORAL | Status: DC | PRN

## 2020-01-23 MED ORDER — ROCURONIUM BROMIDE 10 MG/ML (PF) SYRINGE
PREFILLED_SYRINGE | INTRAVENOUS | Status: DC | PRN
  Administered 2020-01-23: 20 mg via INTRAVENOUS
  Administered 2020-01-23: 70 mg via INTRAVENOUS
  Administered 2020-01-23: 30 mg via INTRAVENOUS
  Administered 2020-01-23: 20 mg via INTRAVENOUS

## 2020-01-23 MED ORDER — ACETAMINOPHEN 500 MG PO TABS
1000.0000 mg | ORAL_TABLET | Freq: Once | ORAL | Status: AC
  Administered 2020-01-23: 08:00:00 1000 mg via ORAL
  Filled 2020-01-23: qty 2

## 2020-01-23 MED ORDER — ROCURONIUM BROMIDE 10 MG/ML (PF) SYRINGE
PREFILLED_SYRINGE | INTRAVENOUS | Status: AC
  Filled 2020-01-23: qty 10

## 2020-01-23 MED ORDER — ONDANSETRON HCL 4 MG/2ML IJ SOLN
INTRAMUSCULAR | Status: DC | PRN
  Administered 2020-01-23: 4 mg via INTRAVENOUS

## 2020-01-23 MED ORDER — DEXAMETHASONE SODIUM PHOSPHATE 10 MG/ML IJ SOLN
INTRAMUSCULAR | Status: DC | PRN
  Administered 2020-01-23: 4 mg via INTRAVENOUS

## 2020-01-23 MED ORDER — MAGNESIUM CITRATE PO SOLN: 1.0000 | Freq: Once | ORAL | Status: DC

## 2020-01-23 MED ORDER — STERILE WATER FOR IRRIGATION IR SOLN
Status: DC | PRN
  Administered 2020-01-23: 1000 mL

## 2020-01-23 MED ORDER — MIDAZOLAM HCL 5 MG/5ML IJ SOLN
INTRAMUSCULAR | Status: DC | PRN
  Administered 2020-01-23: 2 mg via INTRAVENOUS

## 2020-01-23 MED ORDER — DIPHENHYDRAMINE HCL 12.5 MG/5ML PO ELIX: 12.5000 mg | ORAL_SOLUTION | Freq: Four times a day (QID) | ORAL | Status: DC | PRN

## 2020-01-23 MED ORDER — LIDOCAINE 2% (20 MG/ML) 5 ML SYRINGE
INTRAMUSCULAR | Status: AC
  Filled 2020-01-23: qty 5

## 2020-01-23 MED ORDER — BELLADONNA ALKALOIDS-OPIUM 16.2-60 MG RE SUPP: 1.0000 | Freq: Four times a day (QID) | RECTAL | Status: DC | PRN

## 2020-01-23 MED ORDER — SODIUM CHLORIDE 0.9 % IV BOLUS: 1000.0000 mL | Freq: Once | INTRAVENOUS | Status: DC

## 2020-01-23 MED ORDER — LISINOPRIL 10 MG PO TABS
10.0000 mg | ORAL_TABLET | Freq: Every day | ORAL | Status: DC
  Administered 2020-01-24: 10 mg via ORAL
  Filled 2020-01-23: qty 1

## 2020-01-23 MED ORDER — FENTANYL CITRATE (PF) 250 MCG/5ML IJ SOLN
INTRAMUSCULAR | Status: DC | PRN
  Administered 2020-01-23 (×2): 50 ug via INTRAVENOUS
  Administered 2020-01-23: 100 ug via INTRAVENOUS
  Administered 2020-01-23: 50 ug via INTRAVENOUS

## 2020-01-23 MED ORDER — SODIUM CHLORIDE (PF) 0.9 % IJ SOLN
INTRAMUSCULAR | Status: AC
  Filled 2020-01-23: qty 20

## 2020-01-23 MED ORDER — LIDOCAINE 2% (20 MG/ML) 5 ML SYRINGE
INTRAMUSCULAR | Status: DC | PRN
  Administered 2020-01-23: 60 mg via INTRAVENOUS

## 2020-01-23 MED ORDER — DEXAMETHASONE SODIUM PHOSPHATE 10 MG/ML IJ SOLN
INTRAMUSCULAR | Status: AC
  Filled 2020-01-23: qty 1

## 2020-01-23 MED ORDER — NITROGLYCERIN 0.4 MG SL SUBL: 0.4000 mg | SUBLINGUAL_TABLET | SUBLINGUAL | Status: DC | PRN

## 2020-01-23 MED ORDER — CELECOXIB 200 MG PO CAPS
200.0000 mg | ORAL_CAPSULE | Freq: Once | ORAL | Status: AC
  Administered 2020-01-23: 08:00:00 200 mg via ORAL
  Filled 2020-01-23: qty 1

## 2020-01-23 MED ORDER — BUPIVACAINE LIPOSOME 1.3 % IJ SUSP
20.0000 mL | Freq: Once | INTRAMUSCULAR | Status: DC
  Filled 2020-01-23: qty 20

## 2020-01-23 MED ORDER — PHENYLEPHRINE 40 MCG/ML (10ML) SYRINGE FOR IV PUSH (FOR BLOOD PRESSURE SUPPORT)
PREFILLED_SYRINGE | INTRAVENOUS | Status: AC
  Filled 2020-01-23: qty 10

## 2020-01-23 SURGICAL SUPPLY — 72 items
ADH SKN CLS APL DERMABOND .7 (GAUZE/BANDAGES/DRESSINGS) ×2
APL PRP STRL LF DISP 70% ISPRP (MISCELLANEOUS) ×2
APL SWBSTK 6 STRL LF DISP (MISCELLANEOUS) ×2
APPLICATOR COTTON TIP 6 STRL (MISCELLANEOUS) ×2 IMPLANT
APPLICATOR COTTON TIP 6IN STRL (MISCELLANEOUS) ×3
CATH FOLEY 2WAY SLVR 18FR 30CC (CATHETERS) ×3 IMPLANT
CATH TIEMANN FOLEY 18FR 5CC (CATHETERS) ×3 IMPLANT
CHLORAPREP W/TINT 26 (MISCELLANEOUS) ×3 IMPLANT
CLIP VESOLOCK LG 6/CT PURPLE (CLIP) ×7 IMPLANT
CNTNR URN SCR LID CUP LEK RST (MISCELLANEOUS) ×2 IMPLANT
CONT SPEC 4OZ STRL OR WHT (MISCELLANEOUS) ×3
COVER SURGICAL LIGHT HANDLE (MISCELLANEOUS) ×3 IMPLANT
COVER TIP SHEARS 8 DVNC (MISCELLANEOUS) ×2 IMPLANT
COVER TIP SHEARS 8MM DA VINCI (MISCELLANEOUS) ×3
COVER WAND RF STERILE (DRAPES) IMPLANT
CUTTER ECHEON FLEX ENDO 45 340 (ENDOMECHANICALS) ×3 IMPLANT
DECANTER SPIKE VIAL GLASS SM (MISCELLANEOUS) ×3 IMPLANT
DERMABOND ADVANCED (GAUZE/BANDAGES/DRESSINGS) ×1
DERMABOND ADVANCED .7 DNX12 (GAUZE/BANDAGES/DRESSINGS) ×2 IMPLANT
DRAIN CHANNEL RND F F (WOUND CARE) IMPLANT
DRAPE ARM DVNC X/XI (DISPOSABLE) ×8 IMPLANT
DRAPE COLUMN DVNC XI (DISPOSABLE) ×2 IMPLANT
DRAPE DA VINCI XI ARM (DISPOSABLE) ×12
DRAPE DA VINCI XI COLUMN (DISPOSABLE) ×3
DRAPE SURG IRRIG POUCH 19X23 (DRAPES) ×3 IMPLANT
DRSG TEGADERM 4X4.75 (GAUZE/BANDAGES/DRESSINGS) ×3 IMPLANT
ELECT REM PT RETURN 15FT ADLT (MISCELLANEOUS) ×3 IMPLANT
GAUZE 4X4 16PLY RFD (DISPOSABLE) IMPLANT
GAUZE SPONGE 2X2 8PLY STRL LF (GAUZE/BANDAGES/DRESSINGS) IMPLANT
GLOVE BIO SURGEON STRL SZ 6.5 (GLOVE) ×3 IMPLANT
GLOVE BIOGEL M STRL SZ7.5 (GLOVE) ×6 IMPLANT
GLOVE BIOGEL PI IND STRL 7.5 (GLOVE) ×2 IMPLANT
GLOVE BIOGEL PI INDICATOR 7.5 (GLOVE) ×1
GOWN STRL REUS W/TWL LRG LVL3 (GOWN DISPOSABLE) ×9 IMPLANT
HOLDER FOLEY CATH W/STRAP (MISCELLANEOUS) ×3 IMPLANT
IRRIG SUCT STRYKERFLOW 2 WTIP (MISCELLANEOUS) ×3
IRRIGATION SUCT STRKRFLW 2 WTP (MISCELLANEOUS) ×2 IMPLANT
IV LACTATED RINGERS 1000ML (IV SOLUTION) ×3 IMPLANT
KIT PROCEDURE DA VINCI SI (MISCELLANEOUS) ×3
KIT PROCEDURE DVNC SI (MISCELLANEOUS) ×2 IMPLANT
KIT TURNOVER KIT A (KITS) IMPLANT
NDL INSUFFLATION 14GA 120MM (NEEDLE) ×2 IMPLANT
NDL SPNL 22GX7 QUINCKE BK (NEEDLE) ×2 IMPLANT
NEEDLE INSUFFLATION 14GA 120MM (NEEDLE) ×3 IMPLANT
NEEDLE SPNL 22GX7 QUINCKE BK (NEEDLE) ×3 IMPLANT
PACK ROBOTIC CUSTOM UROLOGY (CUSTOM PROCEDURE TRAY) ×3 IMPLANT
PAD POSITIONING PINK XL (MISCELLANEOUS) ×3 IMPLANT
PENCIL SMOKE EVACUATOR (MISCELLANEOUS) IMPLANT
PORT ACCESS TROCAR AIRSEAL 12 (TROCAR) ×2 IMPLANT
PORT ACCESS TROCAR AIRSEAL 5M (TROCAR) ×1
RELOAD STAPLE 45 4.1 GRN THCK (STAPLE) ×2 IMPLANT
SEAL CANN UNIV 5-8 DVNC XI (MISCELLANEOUS) ×8 IMPLANT
SEAL XI 5MM-8MM UNIVERSAL (MISCELLANEOUS) ×12
SET TRI-LUMEN FLTR TB AIRSEAL (TUBING) ×3 IMPLANT
SOLUTION ELECTROLUBE (MISCELLANEOUS) ×3 IMPLANT
SPONGE GAUZE 2X2 STER 10/PKG (GAUZE/BANDAGES/DRESSINGS) ×1
SPONGE LAP 4X18 RFD (DISPOSABLE) ×3 IMPLANT
STAPLE RELOAD 45 GRN (STAPLE) ×2 IMPLANT
STAPLE RELOAD 45MM GREEN (STAPLE) ×3
SUT ETHILON 3 0 PS 1 (SUTURE) ×3 IMPLANT
SUT MNCRL AB 4-0 PS2 18 (SUTURE) ×6 IMPLANT
SUT NOVA NAB DX-16 0-1 5-0 T12 (SUTURE) ×1 IMPLANT
SUT PDS AB 1 CT1 27 (SUTURE) ×6 IMPLANT
SUT VIC AB 2-0 SH 27 (SUTURE) ×3
SUT VIC AB 2-0 SH 27X BRD (SUTURE) ×2 IMPLANT
SUT VICRYL 0 UR6 27IN ABS (SUTURE) ×3 IMPLANT
SUT VLOC BARB 180 ABS3/0GR12 (SUTURE) ×9
SUTURE VLOC BRB 180 ABS3/0GR12 (SUTURE) ×6 IMPLANT
SYR 27GX1/2 1ML LL SAFETY (SYRINGE) ×3 IMPLANT
TOWEL OR NON WOVEN STRL DISP B (DISPOSABLE) ×3 IMPLANT
TROCAR XCEL NON-BLD 5MMX100MML (ENDOMECHANICALS) IMPLANT
WATER STERILE IRR 1000ML POUR (IV SOLUTION) ×3 IMPLANT

## 2020-01-23 NOTE — Anesthesia Postprocedure Evaluation (Signed)
Anesthesia Post Note  Patient: Adam Lee.  Procedure(s) Performed: XI ROBOTIC ASSISTED LAPAROSCOPIC RADICAL PROSTATECTOMY (N/A Abdomen) LYMPHADENECTOMY (Bilateral Abdomen)     Patient location during evaluation: PACU Anesthesia Type: General Level of consciousness: awake and alert Pain management: pain level controlled Vital Signs Assessment: post-procedure vital signs reviewed and stable Respiratory status: spontaneous breathing, nonlabored ventilation, respiratory function stable and patient connected to nasal cannula oxygen Cardiovascular status: blood pressure returned to baseline and stable Postop Assessment: no apparent nausea or vomiting Anesthetic complications: no    Last Vitals:  Vitals:   01/23/20 1330 01/23/20 1345  BP: 118/76 118/76  Pulse: 81 88  Resp: (!) 21 (!) 30  Temp:  (!) 36.4 C  SpO2: 94% 95%    Last Pain:  Vitals:   01/23/20 1345  TempSrc:   PainSc: 0-No pain                 Everitt Wenner,W. EDMOND

## 2020-01-23 NOTE — Discharge Instructions (Signed)

## 2020-01-23 NOTE — Anesthesia Procedure Notes (Signed)
Procedure Name: MAC Date/Time: 01/23/2020 8:52 AM Performed by: Mitzie Na, CRNA Pre-anesthesia Checklist: Patient identified, Emergency Drugs available, Suction available and Patient being monitored Patient Re-evaluated:Patient Re-evaluated prior to induction Preoxygenation: Pre-oxygenation with 100% oxygen Induction Type: IV induction Ventilation: Oral airway inserted - appropriate to patient size and Two handed mask ventilation required Laryngoscope Size: Mac and 4 Grade View: Grade II Tube type: Oral Tube size: 7.5 mm Number of attempts: 1 Airway Equipment and Method: Stylet Placement Confirmation: ETT inserted through vocal cords under direct vision,  positive ETCO2 and breath sounds checked- equal and bilateral Secured at: 23 cm Tube secured with: Tape Dental Injury: Teeth and Oropharynx as per pre-operative assessment  Comments: Successful intubation by K. Forde Dandy. Atraumatic.

## 2020-01-23 NOTE — Transfer of Care (Signed)
Immediate Anesthesia Transfer of Care Note  Patient: Adam Lee.  Procedure(s) Performed: XI ROBOTIC ASSISTED LAPAROSCOPIC RADICAL PROSTATECTOMY (N/A Abdomen) LYMPHADENECTOMY (Bilateral Abdomen)  Patient Location: PACU  Anesthesia Type:General  Level of Consciousness: awake, drowsy and patient cooperative  Airway & Oxygen Therapy: Patient Spontanous Breathing and Patient connected to face mask oxygen  Post-op Assessment: Report given to RN, Post -op Vital signs reviewed and stable and Patient moving all extremities  Post vital signs: Reviewed and stable  Last Vitals:  Vitals Value Taken Time  BP 125/87 01/23/20 1218  Temp    Pulse 80 01/23/20 1221  Resp 21 01/23/20 1221  SpO2 95 % 01/23/20 1221  Vitals shown include unvalidated device data.  Last Pain:  Vitals:   01/23/20 0737  TempSrc:   PainSc: 0-No pain         Complications: No apparent anesthesia complications

## 2020-01-23 NOTE — H&P (Signed)
Adam Lee. is an 64 y.o. male.    Chief Complaint: Pre-Op Prostatectomy  HPI:   1 - Moderate Risk Prostate Cancer - 6/12 cores up to 80% Grade 3 cancer by BX by McKenzie 11/2019 on eval PSA 6.8. TRUS 24mL, no median. All left sided cores positive. CT and Bone Scan w/o mets.   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 His PCP is Sallee Lange MD.   Today " Adam Lee " I sseen to proceed with curative intent prostatectomy.    Past Medical History:  Diagnosis Date  . Anginal pain ([place]) 2019  . Diabetes mellitus without complication ([place])   . Dysrhythmia    stress related. fast HR  . History of stress test 2014   intermediate risk  . Hyperlipemia   . Hypertension   . Prostate cancer [facility])     Past Surgical History:  Procedure Laterality Date  . COLONOSCOPY N/A 01/03/2020   Procedure: COLONOSCOPY;  Surgeon: Rogene Houston, MD;  Location: [facility] ENDO SUITE;  Service: Endoscopy;  Laterality: N/A;  1055  . HERNIA REPAIR Left    2000, Dr. Aviva Signs  . POLYPECTOMY  01/03/2020   Procedure: POLYPECTOMY;  Surgeon: Rogene Houston, MD;  Location: [facility] END[facility] SUITE;  Service: Endoscopy;;    Family History  Problem Relation Age of Onset  . Diabetes Father   . Heart disease Mother   . Cancer Other   . Breast cancer Neg Hx   . Prostate cancer Neg Hx   . Colon cancer Neg Hx   . Pancreatic cancer Neg Hx    Social History:  reports that he has never smoked. He has never used smokeless tobacco. He reports that he does not drink alcohol or use drugs.  Allergies: No Known Allergies  No medications prior to admission.    No results found for this or any previous visit (from the past 48 hour(s)). No results found.  Review of  Systems  Constitutional: Negative for chills and fever.  All other systems reviewed and are negative.   There were no vitals taken for this visit. Physical Exam  Constitutional: He appears well-developed.  HENT:  Head: Normocephalic.  Eyes: Pupils are equal, round, and reactive to light.  Cardiovascular: Normal rate.  Respiratory: Effort normal.  GI: Soft.  Supraumbilical scar w/o hernia.   Genitourinary:    Genitourinary Comments: No CVAT.    Musculoskeletal:        General: Normal range of motion.     Cervical back: Normal range of motion.  Neurological: He is alert.  Skin: Skin is warm.  Psychiatric: He has a normal mood and affect.     Assessment/Plan  Proceed as planned with prostatectomy with node dissection. Risks, benefits, alternatives, expected peri-op course discussed previously and reiterated today.   Alexis Frock, MD 01/23/2020, 5:31 AM

## 2020-01-23 NOTE — Brief Op Note (Signed)
01/23/2020  4:31 PM  PATIENT:  Adam Lee.  64 y.o. male  PRE-OPERATIVE DIAGNOSIS:  PROSTATE CANCER  POST-OPERATIVE DIAGNOSIS:  PROSTATE CANCER  PROCEDURE:  Procedure(s) with comments: XI ROBOTIC ASSISTED LAPAROSCOPIC RADICAL PROSTATECTOMY (N/A) - 3 HRS LYMPHADENECTOMY (Bilateral)  SURGEON:  Surgeon(s) and Role:    * Alexis Frock, MD - Primary  PHYSICIAN ASSISTANT:   ASSISTANTS: Debbrah Alar PA   ANESTHESIA:   local and general  EBL:  150 mL   BLOOD ADMINISTERED:none  DRAINS: 1 - JP to bulb; 2 - Foley to gravity   LOCAL MEDICATIONS USED:  MARCAINE     SPECIMEN:  Source of Specimen:  1 - periprostatic fat; 2 - prostatectomy; 3 - pelvic lymph nodes  DISPOSITION OF SPECIMEN:  PATHOLOGY  COUNTS:  YES  TOURNIQUET:  * No tourniquets in log *  DICTATION: .Other Dictation: Dictation Number  [id number]  PLAN OF CARE: Admit for overnight observation  PATIENT DISPOSITION:  PACU - hemodynamically stable.   Delay start of Pharmacological VTE agent (>24hrs) due to surgical blood loss or risk of bleeding: yes

## 2020-01-24 DIAGNOSIS — C61 Malignant neoplasm of prostate: Secondary | ICD-10-CM | POA: Diagnosis not present

## 2020-01-24 LAB — HEMOGLOBIN AND HEMATOCRIT, BLOOD
HCT: 43.1 % (ref 39.0–52.0)
Hemoglobin: 13.9 g/dL (ref 13.0–17.0)

## 2020-01-24 LAB — BASIC METABOLIC PANEL
Anion gap: 9 (ref 5–15)
BUN: 26 mg/dL — ABNORMAL HIGH (ref 8–23)
CO2: 23 mmol/L (ref 22–32)
Calcium: 8.3 mg/dL — ABNORMAL LOW (ref 8.9–10.3)
Chloride: 100 mmol/L (ref 98–111)
Creatinine, Ser: 1.3 mg/dL — ABNORMAL HIGH (ref 0.61–1.24)
GFR calc Af Amer: >60 mL/min (ref >60)
GFR calc non Af Amer: 57 mL/min — ABNORMAL LOW (ref >60)
Glucose, Bld: 155 mg/dL — ABNORMAL HIGH (ref 70–99)
Potassium: 4.8 mmol/L (ref 3.5–5.1)
Sodium: 132 mmol/L — ABNORMAL LOW (ref 135–145)

## 2020-01-24 LAB — GLUCOSE, CAPILLARY
Glucose-Capillary: 162 mg/dL — ABNORMAL HIGH (ref 70–99)
Glucose-Capillary: 160 mg/dL — ABNORMAL HIGH (ref 70–99)
Glucose-Capillary: 118 mg/dL — ABNORMAL HIGH (ref 70–99)

## 2020-01-24 LAB — CREATININE, FLUID (PLEURAL, PERITONEAL, JP DRAINAGE): Creat, Fluid: 1.3 mg/dL

## 2020-01-24 NOTE — Progress Notes (Signed)
Patient ambulated on hall way via walker twice this shift, tolerated well.

## 2020-01-24 NOTE — Discharge Summary (Signed)
Date of admission: 01/23/2020  Date of discharge: 01/24/2020  Admission diagnosis: Prostate Cancer  Discharge diagnosis: Prostate Cancer  History and Physical: For full details, please see admission history and physical. Briefly, Adam Lee. is a 64 y.o. gentleman with localized prostate cancer.  After discussing management/treatment options, he elected to proceed with surgical treatment.  Hospital Course: Adam Lee. was taken to the operating room on 01/23/2020 and underwent a robotic assisted laparoscopic radical prostatectomy. He tolerated this procedure well and without complications. Postoperatively, he was able to be transferred to a regular hospital room following recovery from anesthesia.  He was able to begin ambulating the night of surgery. He remained hemodynamically stable overnight.  He had excellent urine output. Pelvic drain JP Cr checked and negative for urine leak, and his pelvic drain was removed on POD #1.  He was transitioned to oral pain medication, tolerated a regular diet, passed gas, and had met all discharge criteria and was able to be discharged home later on POD#1.  Laboratory values:  Recent Labs    01/23/20 1258 01/24/20 0503  HGB 14.9 13.9  HCT 47.1 43.1    Disposition: Home  Discharge instruction: He was instructed to be ambulatory but to refrain from heavy lifting, strenuous activity, or driving. He was instructed on urethral catheter care.  Discharge medications:   Allergies as of 01/24/2020   No Known Allergies     Medication List    STOP taking these medications   aspirin EC 81 MG tablet   Fish Oil 1000 MG Caps   naproxen sodium 220 MG tablet Commonly known as: ALEVE     TAKE these medications   atorvastatin 10 MG tablet Commonly known as: LIPITOR TAKE 1 TABLET BY MOUTH EVERY DAY   blood glucose meter kit and supplies Dispense based on patient and insurance preference. Use to check sugars once a day (FOR ICD-10 E10.9,  E11.9).   HYDROcodone-acetaminophen 5-325 MG tablet Commonly known as: Norco Take 1-2 tablets by mouth every 6 (six) hours as needed for moderate pain.   lisinopril 10 MG tablet Commonly known as: ZESTRIL TAKE 1 TABLET BY MOUTH EVERY DAY   metFORMIN 500 MG tablet Commonly known as: GLUCOPHAGE TAKE 2 TABLETS BY MOUTH TWICE A DAY What changed: when to take this   nitroGLYCERIN 0.4 MG SL tablet Commonly known as: NITROSTAT Place 1 tablet (0.4 mg total) under the tongue every 5 (five) minutes as needed for chest pain.   OneTouch Delica Plus [id number] Misc USE TO OBTAIN A BLOOD SPECIMEN TO TEST BLOOD SUGAR DAILY   OneTouch Ultra test strip Generic drug: glucose blood USE TO TEST BLOOD SUGAR DAILY   sulfamethoxazole-trimethoprim 800-160 MG tablet Commonly known as: BACTRIM DS Take 1 tablet by mouth 2 (two) times daily. Start the day prior to foley removal appointment       Followup: He will followup for catheter removal and to discuss his surgical pathology results.

## 2020-01-24 NOTE — Progress Notes (Signed)
1 Day Post-Op Subjective: The patient is doing well.  No nausea or vomiting. Pain is adequately controlled. Ambulating, passing gas.   Objective: Vital signs in last 24 hours: Temp:  [97.4 F (36.3 C)-98.7 F (37.1 C)] 97.8 F (36.6 C) (04/22 0507) Pulse Rate:  [78-100] 92 (04/22 0507) Resp:  [16-30] 20 (04/22 0507) BP: (104-147)/(64-93) 145/88 (04/22 0936) SpO2:  [93 %-97 %] 96 % (04/22 0507)  Intake/Output from previous day: 04/21 0701 - 04/22 0700 In: 2812.3 [P.O.:360; I.V.:2402.3; IV Piggyback:50] Out: 1340 [Urine:800; Drains:390; Blood:150] Intake/Output this shift: Total I/O In: -  Out: 20 [Drains:20]  Physical Exam:  General: Alert and oriented. CV: Regular rate Lungs: Clear bilaterally. GI: Soft, Nondistended. Incisions: Clean, dry, and intact Urine: Clear Extremities: Nontender, no erythema, no edema.  Lab Results: Recent Labs    01/23/20 1258 01/24/20 0503  HGB 14.9 13.9  HCT 47.1 43.1      Assessment/Plan: POD# 1 s/p robotic prostatectomy, doing well. JP Cr negative, will dc drain.   - Ambulate, Incentive spirometry - Continue regular diet - D/C pelvic drain - Plan for likely discharge later today    LOS: 0 days   Haskel Schroeder 01/24/2020, 11:10 AM

## 2020-01-24 NOTE — Progress Notes (Signed)
Pt to be discharged this evening to home. Pt and Pt's Wife given discharge instructions including Medications and schedules of these Medications. Home Prostatectomy care reviewed with pt and Pt's wife. Pt able to return demonstration of foley to leg bag and how to empty both bags. Pt and Pt's wife verbalized understanding of all discharge teaching. Discharge packet with pt at time of discharge

## 2020-01-24 NOTE — Op Note (Signed)
NAME: Adam Lee, [place] MEDICAL RECORD [id number] ACCOUNT [id number] DATE OF BIRTH:[birth year] FACILITY: [facility] LOCATION: [id number] David Stall, MD  OPERATIVE REPORT  DATE OF PROCEDURE:  01/23/2020  SURGEON:  Alexis Frock, MD  PREOPERATIVE DIAGNOSIS:  Moderate-risk prostate cancer.  POSTOPERATIVE DIAGNOSIS:  Moderate-risk prostate cancer.  PROCEDURES:   1.  Robotic-assisted laparoscopic radical prostatectomy. 2.  Bilateral pelvic lymphadenectomy.  ESTIMATED BLOOD LOSS:  150 mL.  ASSISTANT:  Debbrah Alar, PA  FINDINGS: 1.  No obvious sentinel lymphatic lymph nodes. 2.  Some relatively enlarged left pelvic lymph nodes. 3.  Loose adhesions of sigmoid in the left lower quadrant without any active diverticulitis.  SPECIMENS: 1.  Radical prostatectomy. 2.  Right external iliac lymph nodes. 3.  Right obturator lymph nodes. 4.  Left external iliac lymph nodes. 5.  Left obturator lymph nodes. 6.  Periprosthetic fat.  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  Staging imaging was unremarkable for any locally advanced or distant disease.  Options  were discussed for management, including surveillance protocols versus ablative therapy versus surgical extirpation and he wished to proceed with a curative intent prostatectomy.  He presents for this today.  Informed consent was obtained and placed in  the medical record.  DESCRIPTION OF PROCEDURE:  The patient being identified, the procedure being radical prostatectomy was confirmed.  Procedure timeout was performed.  Intravenous antibiotics administered.  General endotracheal anesthesia induced.  The patient was placed  into a low lithotomy position.  He was further fastened to operative table using 3-inch tape over foam padding across the supraxiphoid chest.  A test of steep Trendelenburg positioning  was performed and he was found to be suitably positioned.  Foley  catheter was placed free to straight drain.  After a sterile field was created, prepped and draped his penis, perineum and proximal thighs using iodine and his infra-xiphoid abdomen using chlorhexidine gluconate.  Next, a high-flow, low-pressure  pneumoperitoneum was obtained with Veress technique in the upper aspect of the umbilicus purposely, directly into a small umbilical hernia that had been reduced.  An 8 mm robotic camera port was then placed in same location.  Laparoscopic examination of  peritoneal cavity revealed some loose adhesions in the left lower quadrant consistent with maybe prior diverticulitis in the past.  No active evidence of this now, no visceral injury.  Additional ports were placed as follows:  Right paramedian 8 mm  robotic port, right far lateral 12 mm AirSeal assist port, right paramedian 5 mm suction port, left paramedian 8 mm robotic port, left far lateral 8 mm robotic port.  Robot was docked and passed the electronic checks.  Initial attention was directed at  development of the space of Retzius.  Incision was made lateral to right the medial umbilical ligament from the midline towards the area of the internal ring, coursing along the iliac vessels towards the area of the right ureter.  The vas deferens was  encountered and purposely ligated using medial bucket handle as the left bladder wall swept away from the lateral pelvic sidewall towards the area of the endopelvic fascia on the right side.  A mirror image dissection was performed in the left side.   Anterior attachments were taken down using cautery scissors to expose the anterior base of the prostate, which was defatted to better denote the bladder neck-prostate junction.  This tissue was set aside labeled  as periprosthetic fat.  Next, 0.2 mL of  indocyanine green dye was injected into each lobe of the prostate using a robotically guided percutaneously  placed spinal needle with intervening suctioning to prevent dye spillage, which did not occur.  Next, the endopelvic fascia was carefully swept  away from the lateral side of the prostate in base to apex orientation to expose the dorsal venous complex.  It was controlled using a green load stapler, taking exquisite care to avoid injury to the membranous urethra, which did not occur.  Then,  approximately 10 minutes post-dye injection, the pelvis was inspected under near infrared fluorescence light.  Sentinel lymphangiography revealed excellent parenchymal uptake of the prostate with some ICG dye; however, there were no obvious sentinel lymph nodes seen within the pelvic lymph node fields.  As such, standard template lymphadenectomy was performed  first on the right side, the right external iliac group with the boundaries being right external iliac artery, vein, pelvic sidewall, iliac bifurcation.  Lymphostasis was achieved with cold clips.  Next, the right obturator group was dissected free, the  boundaries being right obturator nerve, pelvic sidewall, right external iliac vein.  Lymphostasis was achieved with cold clips, set aside labeled right obturator lymph nodes.  Next, a mirror image lymphadenectomy was performed on the left side of the  left external iliac and left obturator group respectively.  Notably on the left side, there were several grossly enlarged lymph nodes noted.  This was somewhat concerning for a reactive process versus possible nodal metastasis.  The left obturator nerve  was inspected following maneuvers and found to be uninjured.  Attention was directed at bladder neck dissection.  A lateral release was performed on each side to better denote the bladder neck-prostate junction.  The bladder neck was released from the  prostate in an anterior, posterior direction, keeping what appeared to be a rim of circular muscle fibers each plane of dissection.  Attention was directed at posterior  dissection.  Incision was then made posterior to the posterior lip of the prostate  and the plane of Denonvilliers was entered.  Bilateral seminal vesicles were encountered, as were the vas deferens.  The vas deferens were then resected approximately 4 cm, ligated and placed on gentle superior traction.  Bilateral seminal vesicles  dissected to their tip and placed on gentle superior traction.  Dissection proceeded within the plane Denonvilliers inferiorly towards the area of the apex of the prostate.  This exposed the vascular pedicles on each side.  The right pedicles were  controlled using a sequential clipping technique in a base to apex orientation, performing moderate nerve sparing.  On the left side, a purposeful wide dissection was performed given the predominance of left-sided disease, again using a sequential  clipping technique.  Final apical dissection was performed in the anterior plane by placing the prostate on superior traction and verifying the catheter was in situ and transecting the membranous urethra coldly.  This completely freed up the prostatectomy  specimen.  It was placed in an EndoCatch bag for later retrieval.  Next, the rectum was examined with indicator glove under laparoscopic vision.  No evidence of rectal violation was noted.  Posterior dissection was then performed using a 3-0 V-Loc  suture, reapproximating the posterior urethral plate, the posterior bladder neck, bringing these structures into tension free apposition.  Next, mucosa-to-mucosa anastomosis was performed using double arm 3-0 V-Loc suture from the 6 o'clock to 12 o'clock  position, which resulted in excellent tension-free apposition.  Sponge and needle counts were correct.  Hemostasis appeared excellent.  A new Foley catheter was placed per urethra to straight drain, which irrigated quantitatively.  A closed suction  drain was brought through the previous left lateral most robotic port site into the area of the  peritoneal cavity.  Robot was then undocked.  Specimen was retrieved by extending the previous camera port site inferiorly for a distance of approximately 3.5  cm, removing the prostatectomy specimen, setting it aside for permanent pathology.  This extraction site was closed at the level of the fascia using figure-of-eight Novafil x3.  This inherently repaired the small umbilical hernia defect.  Scarpa's was  reapproximated with running Vicryl.  All incision sites were infiltrated with dilute lipolyzed Marcaine and closed at the level of the skin using subcuticular Monocryl, followed by Dermabond.  Procedure was then terminated.  The patient tolerated the  procedure well.  No immediate perioperative complications.  The patient was taken to postanesthesia care in stable condition.  We will plan for observation admission overnight.  VN/NUANCE  D:01/23/2020 T:01/23/2020 JOB:[id number]

## 2020-01-28 LAB — SURGICAL PATHOLOGY

## 2020-01-29 ENCOUNTER — Other Ambulatory Visit: Payer: Self-pay | Admitting: Urology

## 2020-01-29 DIAGNOSIS — R972 Elevated prostate specific antigen [PSA]: Secondary | ICD-10-CM | POA: Diagnosis not present

## 2020-01-30 ENCOUNTER — Ambulatory Visit (INDEPENDENT_AMBULATORY_CARE_PROVIDER_SITE_OTHER): Payer: Medicare HMO | Admitting: Urology

## 2020-01-30 ENCOUNTER — Other Ambulatory Visit: Payer: Self-pay

## 2020-01-30 DIAGNOSIS — C61 Malignant neoplasm of prostate: Secondary | ICD-10-CM

## 2020-01-30 LAB — PSA, TOTAL AND FREE
PSA, % Free: UNDETERMINED % (calc) (ref >25)
PSA, Free: <0.1 ng/mL
PSA, Total: 1.2 ng/mL (ref <=4.0)

## 2020-01-30 NOTE — Progress Notes (Signed)
01/30/2020 12:28 PM   Adam Kanner Jr. 12/26/1955 [id number]  Referring provider: Kathyrn Drown, MD [address],  [place] [address]  Prostate Cancer  HPI: [name] is a [phone] with a hx of prostate cancer here for voiding trial after robotic prostatectomy 1 week ago. Pathology Gleason 4+5=9 T3b with focal margin positive at seminal vesical   PMH: Past Medical History:  Diagnosis Date  . Anginal pain ([place]) 2019  . Diabetes mellitus without complication ([place])   . Dysrhythmia    stress related. fast HR  . History of stress test 2014   intermediate risk  . Hyperlipemia   . Hypertension   . Prostate cancer [facility])     Surgical History: Past Surgical History:  Procedure Laterality Date  . COLONOSCOPY N/A 01/03/2020   Procedure: COLONOSCOPY;  Surgeon: Rogene Houston, MD;  Location: [facility];  Service: Endoscopy;  Laterality: N/A;  1055  . HERNIA REPAIR Left    2000, Dr. Aviva Signs  . LYMPHADENECTOMY Bilateral 01/23/2020   Procedure: LYMPHADENECTOMY;  Surgeon: Alexis Frock, MD;  Location: [facility] ORS;  Service: Urology;  Laterality: Bilateral;  . POLYPECTOMY  01/03/2020   Procedure: POLYPECTOMY;  Surgeon: Rogene Houston, MD;  Location: [facility];  Service: Endoscopy;;  . ROBOT ASSISTED LAPAROSCOPIC RADICAL PROSTATECTOMY N/A 01/23/2020   Procedure: XI ROBOTIC ASSISTED LAPAROSCOPIC RADICAL PROSTATECTOMY;  Surgeon: Alexis Frock, MD;  Location: [facility];  Service: Urology;  Laterality: N/A;  3 HRS    Home Medications:  Allergies as of 01/30/2020   No Known Allergies     Medication List       Accurate as of January 30, 2020 12:28 PM. If you have any questions, ask your nurse or doctor.        atorvastatin 10 MG tablet Commonly known as: LIPITOR TAKE 1 TABLET BY MOUTH EVERY DAY   blood glucose meter kit and supplies Dispense based on patient and insurance preference. Use to check sugars once a day (FOR ICD-10 E10.9, E11.9).     HYDROcodone-acetaminophen 5-325 MG tablet Commonly known as: Norco Take 1-2 tablets by mouth every 6 (six) hours as needed for moderate pain.   lisinopril 10 MG tablet Commonly known as: ZESTRIL TAKE 1 TABLET BY MOUTH EVERY DAY   metFORMIN 500 MG tablet Commonly known as: GLUCOPHAGE TAKE 2 TABLETS BY MOUTH TWICE A DAY What changed: when to take this   nitroGLYCERIN 0.4 MG SL tablet Commonly known as: NITROSTAT Place 1 tablet (0.4 mg total) under the tongue every 5 (five) minutes as needed for chest pain.   OneTouch Delica Plus [id number] Misc USE TO OBTAIN A BLOOD SPECIMEN TO TEST BLOOD SUGAR DAILY   OneTouch Ultra test strip Generic drug: glucose blood USE TO TEST BLOOD SUGAR DAILY   sulfamethoxazole-trimethoprim 800-160 MG tablet Commonly known as: BACTRIM DS Take 1 tablet by mouth 2 (two) times daily. Start the day prior to foley removal appointment       Allergies: No Known Allergies  Family History: Family History  Problem Relation Age of Onset  . Diabetes Father   . Heart disease Mother   . Cancer Other   . Breast cancer Neg Hx   . Prostate cancer Neg Hx   . Colon cancer Neg Hx   . Pancreatic cancer Neg Hx     Social History:  reports that he has never smoked. He has never used smokeless tobacco. He reports that he does not drink alcohol  or use drugs.  ROS: All other review of systems were reviewed and are negative except what is noted above in HPI  Physical Exam: There were no vitals taken for this visit.  Constitutional:  Alert and oriented, No acute distress. HEENT: [place] AT, moist mucus membranes.  Trachea midline, no masses. Cardiovascular: No clubbing, cyanosis, or edema. Respiratory: Normal respiratory effort, no increased work of breathing. GI: Abdomen is soft, nontender, nondistended, no abdominal masses GU: No CVA tenderness.  Lymph: No cervical or inguinal lymphadenopathy. Skin: No rashes, bruises or suspicious lesions. Neurologic: Grossly  intact, no focal deficits, moving all 4 extremities. Psychiatric: Normal mood and affect.  Laboratory Data: Lab Results  Component Value Date   WBC 7.1 01/15/2020   HGB 13.9 01/24/2020   HCT 43.1 01/24/2020   MCV 92.1 01/15/2020   PLT 208 01/15/2020    Lab Results  Component Value Date   CREATININE 1.30 (H) 01/24/2020    Lab Results  Component Value Date   PSA 2.3 12/06/2014    No results found for: TESTOSTERONE  Lab Results  Component Value Date   HGBA1C 7.9 (H) 01/15/2020    Urinalysis No results found for: COLORURINE, APPEARANCEUR, LABSPEC, PHURINE, GLUCOSEU, HGBUR, BILIRUBINUR, KETONESUR, PROTEINUR, UROBILINOGEN, NITRITE, LEUKOCYTESUR  Lab Results  Component Value Date   LABMICR 14.2 08/08/2018    Pertinent Imaging:  No results found for this or any previous visit. No results found for this or any previous visit. No results found for this or any previous visit. No results found for this or any previous visit. No results found for this or any previous visit. No results found for this or any previous visit. No results found for this or any previous visit. No results found for this or any previous visit.  Assessment & Plan:    1. Prostate cancer ([place]) -RTC 4 weeks with PSA -Patent referred to Dr. Tammi Klippel for consideration of adjuvent radiation therapy - PSA; Future   No follow-ups on file.  Nicolette Bang, MD  [facility] [place]

## 2020-01-30 NOTE — Progress Notes (Signed)
Fill and Pull Catheter Removal  Patient is present today for a catheter removal.  Patient was cleaned and prepped in a sterile fashion 125ml of sterile water/ saline was instilled into the bladder when the patient felt the urge to urinate. 33ml of water was then drained from the balloon.  A 18FR foley cath was removed from the bladder no complications were noted .  Patient as then given some time to void on their own.  Patient can void  23ml on their own after some time. Pt. voided all the way across floor to the machine also.   Patient tolerated well.  Performed by: Valentina Lucks, lpn

## 2020-02-03 ENCOUNTER — Other Ambulatory Visit: Payer: Self-pay | Admitting: Family Medicine

## 2020-02-04 ENCOUNTER — Encounter: Payer: Self-pay | Admitting: Urology

## 2020-02-04 NOTE — Patient Instructions (Signed)
Prostate Cancer  The prostate is a male gland that helps make semen. Prostate cancer is when abnormal cells grow in this gland. Follow these instructions at home:  Take over-the-counter and prescription medicines only as told by your doctor.  Eat a healthy diet.  Get plenty of sleep.  Ask your doctor for help to find a support group for men with prostate cancer.  Keep all follow-up visits as told by your doctor. This is important.  If you have to go to the hospital, let your cancer doctor (oncologist) know.  Touch, hold, hug, and caress your partner to continue to show sexual feelings. Contact a doctor if:  You have trouble peeing (urinating).  You have blood in your pee (urine).  You have pain in your hips, back, or chest. Get help right away if:  You have weakness in your legs.  You lose feeling (have numbness) in your legs.  You cannot control your pee or your poop (stool).  You have trouble breathing.  You have sudden pain in your chest.  You have chills or a fever. Summary  The prostate is a male gland that helps make semen. Prostate cancer is when abnormal cells grow in this gland.  Ask your doctor for help to find a support group for men with prostate cancer.  Contact a doctor if you have problems peeing or have any new pain that you did not have before. This information is not intended to replace advice given to you by your health care provider. Make sure you discuss any questions you have with your health care provider. Document Revised: 09/02/2017 Document Reviewed: 05/31/2016 Elsevier Patient Education  2020 Elsevier Inc.  

## 2020-02-05 DIAGNOSIS — R69 Illness, unspecified: Secondary | ICD-10-CM | POA: Diagnosis not present

## 2020-02-05 NOTE — Telephone Encounter (Signed)
90-day only on the medication as needed refills on the strips

## 2020-02-06 ENCOUNTER — Ambulatory Visit: Payer: Medicare HMO | Admitting: Urology

## 2020-02-08 ENCOUNTER — Ambulatory Visit
Admission: RE | Admit: 2020-02-08 | Discharge: 2020-02-08 | Disposition: A | Discharge status: Home / Self Care | Payer: Medicare HMO | Source: Ambulatory Visit | Attending: Urology | Admitting: Urology

## 2020-02-08 ENCOUNTER — Other Ambulatory Visit: Payer: Self-pay

## 2020-02-08 ENCOUNTER — Encounter: Payer: Self-pay | Admitting: Urology

## 2020-02-08 ENCOUNTER — Ambulatory Visit
Admission: RE | Admit: 2020-02-08 | Discharge: 2020-02-08 | Disposition: A | Discharge status: Home / Self Care | Payer: Medicare HMO | Source: Ambulatory Visit | Attending: Radiation Oncology | Admitting: Radiation Oncology

## 2020-02-08 DIAGNOSIS — C61 Malignant neoplasm of prostate: Secondary | ICD-10-CM | POA: Diagnosis not present

## 2020-02-08 DIAGNOSIS — R9721 Rising PSA following treatment for malignant neoplasm of prostate: Secondary | ICD-10-CM | POA: Diagnosis not present

## 2020-02-08 DIAGNOSIS — Z9079 Acquired absence of other genital organ(s): Secondary | ICD-10-CM | POA: Diagnosis not present

## 2020-02-08 NOTE — Progress Notes (Signed)
Radiation Oncology         [phone] ________________________________  Outpatient Re-Consultation - Conducted via Telephone due to current COVID-19 concerns for limiting patient exposure  Name: Adam Lee. MRN: [id number]  Date: 02/08/2020  DOB: 11-22-1955  FW:YOVZCH, Adam Snare, MD  McKenzie, Candee Furbish, MD   REFERRING PHYSICIAN: Cleon Gustin, MD  DIAGNOSIS: 64 y.o. gentleman with adverse pathology s/p RALP 01/2020 for stage pT3b, pN0 Gleason 4+5 prostate cancer with pretreatment PSA of 6.8.    ICD-10-CM   1. Prostate cancer [facility])  C61     HISTORY OF PRESENT ILLNESS: [place] Lee. is a 64 y.o. male with a diagnosis of prostate cancer. He was noted to have an elevated PSA of 5.4 by his primary care physician, Dr. Wolfgang Phoenix.  Accordingly, he was referred for evaluation in urology by Dr. Alyson Ingles on 08/08/2019.  PSA was repeated at that time remained elevated at 6.8.  The patient proceeded to transrectal ultrasound with 12 biopsies of the prostate on 10/10/2019.  The prostate volume measured 48.8 cc.  Out of 12 core biopsies, 6 were positive, all on the left.  The maximum Gleason score was 4+3, and this was seen in the left base lateral (with perineural invasion) and left mid lateral. Additionally,Gleason 3+4 was seen in the remaining left-sided cores, with perineural invasion to the left apex.  We met with the patient on 11/20/2019 to discuss radiation treatment options and he ultimately elected to proceed with prostatectomy. This was performed on 01/23/2020 under the are and direction of Dr. Tresa Moore. Unfortunately, pathology from the procedure revealed upstaging to a Gleason 4+5, as well as extraprostatic extension at the left posterolateral from mid to base, bilateral seminal vesicle invasion, and focally positive margin at the left seminal vesicle. The four biopsied lymph nodes, however, were benign.  A PSA was obtained on 01/29/2020, less than a week following surgery, and remained  detectable at 1.2.  The patient reviewed the pathology results with his urologist and he has kindly been referred today for discussion of potential adjuvant radiation.   He is scheduled to begin physical therapy later this month to work on bladder control.  Currently, he is wearing 2-3 depends/day.   PREVIOUS RADIATION THERAPY: No  PAST MEDICAL HISTORY:  Past Medical History:  Diagnosis Date  . Anginal pain ([place]) 2019  . Diabetes mellitus without complication ([place])   . Dysrhythmia    stress related. fast HR  . History of stress test 2014   intermediate risk  . Hyperlipemia   . Hypertension   . Prostate cancer ([place])       PAST SURGICAL HISTORY: Past Surgical History:  Procedure Laterality Date  . COLONOSCOPY N/A 01/03/2020   Procedure: COLONOSCOPY;  Surgeon: Rogene Houston, MD;  [facility];  Service: Endoscopy;  Laterality: N/A;  1055  . HERNIA REPAIR Left    2000, Dr. Aviva Signs  . LYMPHADENECTOMY Bilateral 01/23/2020   Procedure: LYMPHADENECTOMY;  Surgeon: Alexis Frock, MD;  Location: [facility];  Service: Urology;  Laterality: Bilateral;  . POLYPECTOMY  01/03/2020   Procedure: POLYPECTOMY;  Surgeon: Rogene Houston, MD;  Location: [facility];  Service: Endoscopy;;  . ROBOT ASSISTED LAPAROSCOPIC RADICAL PROSTATECTOMY N/A 01/23/2020   Procedure: XI ROBOTIC ASSISTED LAPAROSCOPIC RADICAL PROSTATECTOMY;  Surgeon: Alexis Frock, MD;  Location: [facility];  Service: Urology;  Laterality: N/A;  3 HRS    FAMILY HISTORY:  Family History  Problem Relation Age of Onset  .  Diabetes Father   . Heart disease Mother   . Cancer Other   . Breast cancer Neg Hx   . Prostate cancer Neg Hx   . Colon cancer Neg Hx   . Pancreatic cancer Neg Hx     SOCIAL HISTORY:  Social History   Socioeconomic History  . Marital status: Married    Spouse name: Not on file  . Number of children: 2  . Years of education: Not on file  . Highest education level: Not on file  Occupational  History  . Not on file  Tobacco Use  . Smoking status: Never Smoker  . Smokeless tobacco: Never Used  Substance and Sexual Activity  . Alcohol use: No    Alcohol/week: 0.0 standard drinks  . Drug use: No  . Sexual activity: Yes  Other Topics Concern  . Not on file  Social History Narrative  . Not on file   Social Determinants of Health   Financial Resource Strain:   . Difficulty of Paying Living Expenses:   Food Insecurity:   . Worried About Charity fundraiser in the Last Year:   . Arboriculturist in the Last Year:   Transportation Needs:   . Film/video editor (Medical):   Marland Kitchen Lack of Transportation (Non-Medical):   Physical Activity:   . Days of Exercise per Week:   . Minutes of Exercise per Session:   Stress:   . Feeling of Stress :   Social Connections:   . Frequency of Communication with Friends and Family:   . Frequency of Social Gatherings with Friends and Family:   . Attends Religious Services:   . Active Member of Clubs or Organizations:   . Attends Archivist Meetings:   Marland Kitchen Marital Status:   Intimate Partner Violence:   . Fear of Current or Ex-Partner:   . Emotionally Abused:   Marland Kitchen Physically Abused:   . Sexually Abused:     ALLERGIES: Patient has no known allergies.  MEDICATIONS:  Current Outpatient Medications  Medication Sig Dispense Refill  . atorvastatin (LIPITOR) 10 MG tablet TAKE 1 TABLET BY MOUTH EVERY DAY 90 tablet 0  . blood glucose meter kit and supplies Dispense based on patient and insurance preference. Use to check sugars once a day (FOR ICD-10 E10.9, E11.9). 1 each 0  . HYDROcodone-acetaminophen (NORCO) 5-325 MG tablet Take 1-2 tablets by mouth every 6 (six) hours as needed for moderate pain. 20 tablet 0  . Lancets (ONETOUCH DELICA PLUS [id number]) MISC USE TO OBTAIN A BLOOD SPECIMEN TO TEST BLOOD SUGAR DAILY 100 each 0  . lisinopril (ZESTRIL) 10 MG tablet TAKE 1 TABLET BY MOUTH EVERY DAY 90 tablet 0  . metFORMIN (GLUCOPHAGE) 500  MG tablet TAKE 2 TABLETS BY MOUTH TWICE A DAY (Patient taking differently: Take 1,000 mg by mouth daily. ) 360 tablet 0  . ONETOUCH ULTRA test strip USE TO TEST BLOOD SUGAR DAILY 100 strip 0  . sulfamethoxazole-trimethoprim (BACTRIM DS) 800-160 MG tablet Take 1 tablet by mouth 2 (two) times daily. Start the day prior to foley removal appointment 6 tablet 0  . nitroGLYCERIN (NITROSTAT) 0.4 MG SL tablet Place 1 tablet (0.4 mg total) under the tongue every 5 (five) minutes as needed for chest pain. (Patient not taking: Reported on 02/08/2020) 100 tablet 1   No current facility-administered medications for this encounter.    REVIEW OF SYSTEMS:  On review of systems, the patient reports that he is doing well overall.  He denies any chest pain, shortness of breath, cough, fevers, chills, night sweats, unintended weight changes. He denies any bowel disturbances, and denies abdominal pain, nausea or vomiting. He denies any new musculoskeletal or joint aches or pains. He denies dysuria and hematuria but notes darkened urine. He also reports intermittent and weakened stream, as well as mixed urinary incontinence. A complete review of systems is obtained and is otherwise negative.  PHYSICAL EXAM:  Wt Readings from Last 3 Encounters:  01/15/20 283 lb 5 oz (128.5 kg)  01/03/20 285 lb (129.3 kg)  12/05/19 280 lb (127 kg)   Temp Readings from Last 3 Encounters:  01/24/20 97.8 F (36.6 C) (Oral)  01/15/20 98.5 F (36.9 C) (Oral)  01/03/20 97.8 F (36.6 C) (Oral)   BP Readings from Last 3 Encounters:  01/24/20 (!) 145/88  01/15/20 (!) 153/91  01/03/20 123/87   Pulse Readings from Last 3 Encounters:  01/24/20 92  01/15/20 76  01/03/20 87    /10  Physical exam not performed in light of telephone re-consult visit format.  KPS = 100  100 - Normal; no complaints; no evidence of disease. 90   - Able to carry on normal activity; minor signs or symptoms of disease. 80   - Normal activity with effort;  some signs or symptoms of disease. 50   - Cares for self; unable to carry on normal activity or to do active work. 60   - Requires occasional assistance, but is able to care for most of his personal needs. 50   - Requires considerable assistance and frequent medical care. 11   - Disabled; requires special care and assistance. 83   - Severely disabled; hospital admission is indicated although death not imminent. 60   - Very sick; hospital admission necessary; active supportive treatment necessary. 10   - Moribund; fatal processes progressing rapidly. 0     - Dead  Karnofsky DA, [place], Craver LS and [facility] [phone]) The use of the nitrogen mustards in the palliative treatment of carcinoma: with particular reference to bronchogenic carcinoma Cancer 1 634-56  LABORATORY DATA:  Lab Results  Component Value Date   WBC 7.1 01/15/2020   HGB 13.9 01/24/2020   HCT 43.1 01/24/2020   MCV 92.1 01/15/2020   PLT 208 01/15/2020   Lab Results  Component Value Date   NA 132 (L) 01/24/2020   K 4.8 01/24/2020   CL 100 01/24/2020   CO2 23 01/24/2020   Lab Results  Component Value Date   ALT 30 08/08/2018   AST 26 08/08/2018   ALKPHOS 61 08/08/2018   BILITOT 0.9 08/08/2018     RADIOGRAPHY: No results found.    IMPRESSION/PLAN: This visit was conducted via Telephone to spare the patient unnecessary potential exposure in the healthcare setting during the current COVID-19 pandemic. 1. 64 y.o. gentleman with adverse pathology s/p RALP 01/2020 for stage pT3b, pN0 Gleason 4+5 prostate cancer with pretreatment PSA of 6.8. Today we reviewed the findings and workup thus far.  We discussed the natural history of prostate cancer.  We reviewed the the implications of positive margins, extracapsular extension, and seminal vesicle involvement on the risk of prostate cancer recurrence. We reviewed some of the evidence suggesting an advantage for patients who undergo adjuvant radiotherapy in the setting in  terms of disease control and overall survival. We also discussed some of the dilemmas related to the available evidence.  We discussed the SWOG trial which did show an improvement in disease-free  survival as well as overall survival using adjuvant radiotherapy. However, we discussed the fact that the study did not carefully control the usage of adjuvant radiotherapy in the observation arm. There is increasing evidence that careful surveillance with ultrasensitive PSA may provide an opportunity for early salvage in patients who undergo observation, which can lead to excellent results in terms of disease control and survival. We discussed radiation treatment directed to the prostatic fossa with regard to the logistics and delivery of external beam radiation treatment.  He was encouraged to ask questions that were answered to his stated satisfaction.  We discussed the need to give additional time to heal and participate in physical therapy to regain continence prior to proceeding with adjuvant radiotherapy. We also discussed the potential role of ADT concurrent with adjuvant radiotherapy and the side effects associated with this treatment. Pending progress and PSA response, he will discuss this further with Dr. Tresa Moore and once they feel that he is ready, he is in agreement to proceed with 7.5 weeks of adjuvant radiation therapy +/- ADT. We enjoyed meeting back with him today and look forward to following along in his progress and further participating in his care accordingly.  Given current concerns for patient exposure during the COVID-19 pandemic, this encounter was conducted via telephone. The patient was notified in advance and was offered a [place] meeting to allow for face to face communication but unfortunately reported that he did not have the appropriate resources/technology to support such a visit and instead preferred to proceed with telephone consult. The patient has given verbal consent for this type of  encounter. The time spent during this encounter was 60 minutes. The attendants for this meeting include Tyler Pita MD, Freeman Caldron PA-C, [place], patient, Higinio Grow. During the encounter, Tyler Pita MD, Anasha Perfecto PA-C, and scribe, Wilburn Mylar were located at [place].  Patient, Alyaan Budzynski. was located at home.    Nicholos Johns, PA-C    Tyler Pita, MD  [facility] Direct Dial: [phone]  Fax: [phone] [email or web address]  Skype  [email or web address]   This document serves as a record of services personally performed by Tyler Pita, MD. It was created on his behalf by Wilburn Mylar, a trained medical scribe. The creation of this record is based on the scribe's personal observations and the provider's statements to them. This document has been checked and approved by the attending provider.   References:  JAMA. 2006 Nov 15;296(19):2329-35.  Adjuvant radiotherapy for pathologically advanced prostate cancer: a randomized clinical trial.  Grandville Silos IM Jr(1), Tangen CM, Hettie Holstein MS, Ova Freshwater, Messing [facility].  Author information: (1)Department of Urology, [facility] of [facility] [place], [place], [address], [place] [address], [place]. [email or web address]  CONTEXT: Despite a stage-shift to earlier cancer stages and lower tumor volumes for prostate cancer, pathologically advanced disease is detected at radical prostatectomy in 38% to 52% of patients. However, the optimal management of these patients after radical prostatectomy is unknown.  OBJECTIVE: To determine whether adjuvant radiotherapy improves metastasis-free survival in patients with stage pT3 N0 M0 prostate cancer.  DESIGN, SETTING, AND PATIENTS: Randomized, prospective, multi-institutional, [place]  clinical trial with enrollment between May 19, 1987, and October 05, 1995 (with database frozen for statistical analysis on June 24, 2004). Patients were 4 men with pathologically advanced prostate cancer  who had undergone radical prostatectomy. INTERVENTION: Men were randomly assigned to receive 60 to 64 Gy of external beam radiotherapy delivered to the prostatic fossa (n = 214) or usual care plus observation (n = 211).  MAIN OUTCOME MEASURES: Primary outcome was metastasis-free survival, defined as time to first occurrence of metastatic disease or death due to any cause. Secondary outcomes included prostate-specific antigen (PSA) relapse, recurrence-free survival, overall survival, freedom from hormonal therapy, and postoperative complications.  RESULTS: Among the 425 men, median follow-up was 10.6 years (interquartile range, 9.2-12.7 years). For metastasis-free survival, 76 (35.5%) of 214 men in the adjuvant radiotherapy group were diagnosed with metastatic disease or died (median metastasis-free estimate, 14.7 years), compared with 91 (43.1%) of 211 (median metastasis-free estimate, 13.2 years) of those in the observation group (hazard ratio [HR], 0.75; 95% CI, 0.55-1.02; P = .06). There were no significant between-group differences for overall survival (71 deaths, median survival of 14.7 years for radiotherapy vs 83 deaths, median survival of 13.8 years for observation; HR, 0.80; 95% CI, 0.58-1.09; P = .16). PSA relapse (median PSA relapse-free survival, 10.3 years for radiotherapy vs 3.1 years for observation; HR, 0.43; 95% CI, 0.31-0.58; P<.001) and disease recurrence (median recurrence-free survival, 13.8 years for radiotherapy vs 9.9 years for observation; HR, 0.62; 95% CI, 0.46-0.82; P = .001) were both significantly reduced with radiotherapy. Adverse effects were more common with radiotherapy vs observation (23.8% vs 11.9%), including rectal complications (2.8% vs 0%), urethral strictures  (17.8% vs 9.5%), and total urinary incontinence (6.5% vs 2.8%).  CONCLUSIONS: In men who had undergone radical prostatectomy for pathologically advanced prostate cancer, adjuvant radiotherapy resulted in significantly reduced risk of PSA relapse and disease recurrence, although the improvements in metastasis-free survival and overall survival were not statistically significant.  Trial Registration clinicaltrials.gov Identifier: [id number]. PMID: [id number]   J Clin Oncol. 2007 Jun 1;25(16):2225-9. Predominant treatment failure in postprostatectomy patients is local: analysis of patterns of treatment failure in SWOG 8794.  Swanson GP(1), [place] MA, Tangen CM, Chin J, Messing E, Canby-Hagino Daiva Eves, Doy Hutching ED;  Author information: (1)Department of Radiation Oncology and Urology, [facility] of [facility], [place] [address], [place]. [email or web address] Comment in J Clin Oncol. 2007 Dec 10;25(35):5671-2.  PURPOSE: [facility] (SWOG) trial [phone] demonstrated that adjuvant radiation reduces the risk of biochemical (prostate-specific antigen [PSA]) treatment failure by 50% over radical prostatectomy alone. In this analysis, we stratified patients as to their preradiation PSA levels and correlated it with outcomes such as PSA treatment failure, local recurrence, and distant failure, to serve as guidelines for future research.  PATIENTS AND METHODS: Four hundred thirty-one subjects with pathologically advanced prostate cancer (extraprostatic extension, positive surgical margins, or seminal vesicle invasion) were randomly assigned to adjuvant radiotherapy or observation.  RESULTS: Three hundred seventy-four eligible patients had immediate postprostatectomy and follow-up PSA data. Median follow-up was 10.2 years. For patients with a postsurgical PSA of 0.2 ng/mL, radiation was associated with reductions in the 10-year risk of biochemical treatment failure (72% to  42%), local failures (20% to 7%), and distant failures (12% to 4%). For patients with a postsurgical PSA between higher than 0.2 and <or = 1.0 ng/mL, reductions in the 10-year risk of biochemical failure (80% to 73%), local failures (25% to 9%), and distant failures (16% to 12%) were realized. In patients with postsurgical PSA higher than 1.0, the respective findings were 94% versus 100%, 28% versus 9%, and 44% versus 18%.  CONCLUSION: The pattern of treatment failure in  high-risk patients is predominantly local with a surprisingly low incidence of metastatic failure. Adjuvant radiation to the prostate bed reduces the risk of metastatic disease and biochemical failure at all postsurgical PSA levels. Further improvement in reducing local treatment failure is likely to have the greatest impact on outcome in high-risk patients after prostatectomy.  PMID: [id number]     J Urol. 2009 Mar;181(3):956-62.  Adjuvant radiotherapy for pathological T3N0M0 prostate cancer significantly reduces risk of metastases and improves survival: long-term followup of a randomized clinical trial.  Grandville Silos IM(1), Tangen CM, Hettie Holstein MS, Ova Freshwater, Messing [facility].  Chief Strategy Officer information: (1)[employer] [facility] [place], [place], [place], [place]. PURPOSE: Extraprostatic disease will be manifest in a third of men after radical prostatectomy. We present the long-term followup of a randomized clinical trial of radiotherapy to reduce the risk of subsequent metastatic disease and death.  MATERIALS AND METHODS: A total of 431 men with pT3N0M0 prostate cancer were randomized to 60 to 64 Gy adjuvant radiotherapy or observation. The primary study end point was metastasis-free survival.  RESULTS: Of 425 eligible men 211 were randomized to observation and 214 to adjuvant radiation. Of those men under observation 70 ultimately received radiotherapy.  Metastasis-free survival was significantly greater with radiotherapy (93 of 214 events on the radiotherapy arm vs 114 of 211 events on observation; HR 0.71; 95% CI 0.54, 0.94; p = 0.016). Survival improved significantly with adjuvant radiation (88 deaths of 214 on the radiotherapy arm vs 110 deaths of 211 on observation; HR 0.72; 95% CI 0.55, 0.96; p = 0.023).  CONCLUSIONS: Adjuvant radiotherapy after radical prostatectomy for a man with pT3N0M0 prostate cancer significantly reduces the risk of metastasis and increases survival.  PMCID: [id number] PMID: [id number]

## 2020-02-08 NOTE — Progress Notes (Signed)
Follow up new patient  history of Prostate cancer with surgery 4/21/2. Denies and hematuria however reports darkened urine denies any pain or discomfort with urination. Stream often starts and stops while urinating with a weakened stream most of the time. Patient reports leakage follow up appointment scheduled with urologist in a couple weeks.

## 2020-02-20 DIAGNOSIS — M6281 Muscle weakness (generalized): Secondary | ICD-10-CM | POA: Diagnosis not present

## 2020-02-20 DIAGNOSIS — N393 Stress incontinence (female) (male): Secondary | ICD-10-CM | POA: Diagnosis not present

## 2020-02-20 DIAGNOSIS — M62838 Other muscle spasm: Secondary | ICD-10-CM | POA: Diagnosis not present

## 2020-02-21 ENCOUNTER — Other Ambulatory Visit: Payer: Self-pay | Admitting: Family Medicine

## 2020-02-21 DIAGNOSIS — R69 Illness, unspecified: Secondary | ICD-10-CM | POA: Diagnosis not present

## 2020-02-22 ENCOUNTER — Encounter: Payer: Self-pay | Admitting: Medical Oncology

## 2020-02-22 NOTE — Progress Notes (Signed)
Left message with patient to follow up post consult 5/7, with Ashlyn, PA. He is post prostatectomy 01/23/20 with positive margins. He will return for salvage radiation once continence has improved. I gave him my contact information and asked him to call me with questions or concerns.

## 2020-03-04 ENCOUNTER — Other Ambulatory Visit: Payer: Self-pay | Admitting: Urology

## 2020-03-04 DIAGNOSIS — C61 Malignant neoplasm of prostate: Secondary | ICD-10-CM | POA: Diagnosis not present

## 2020-03-04 LAB — PSA: PSA: <0.1 ng/mL (ref <=4.0)

## 2020-03-05 ENCOUNTER — Encounter: Payer: Self-pay | Admitting: Urology

## 2020-03-05 ENCOUNTER — Other Ambulatory Visit: Payer: Self-pay

## 2020-03-05 ENCOUNTER — Ambulatory Visit: Payer: Medicare HMO | Admitting: Urology

## 2020-03-05 VITALS — BP 106/72 | HR 87 | Temp 98.1°F | Ht 74.0 in | Wt 258.0 lb

## 2020-03-05 DIAGNOSIS — C61 Malignant neoplasm of prostate: Secondary | ICD-10-CM

## 2020-03-05 NOTE — Progress Notes (Signed)
 03/05/2020 9:31 AM   Adam D Maddix Jr. 09/10/1956 [id number]  Referring provider: Luking, Scott A, MD [address] [place],  [place] [address]  Prostate cancer  HPI: [name] is a 64yo here for followup for prostate cancer. PSA undetectable. Mild SUI   PMH: Past Medical History:  Diagnosis Date  . Anginal pain (HCC) 2019  . Diabetes mellitus without complication (HCC)   . Dysrhythmia    stress related. fast HR  . History of stress test 2014   intermediate risk  . Hyperlipemia   . Hypertension   . Prostate cancer (HCC)     Surgical History: Past Surgical History:  Procedure Laterality Date  . COLONOSCOPY N/A 01/03/2020   Procedure: COLONOSCOPY;  Surgeon: Rehman, Najeeb U, MD;  [facility];  Service: Endoscopy;  Laterality: N/A;  1055  . HERNIA REPAIR Left    2000, Dr. Mark jenkins  . LYMPHADENECTOMY Bilateral 01/23/2020   Procedure: LYMPHADENECTOMY;  Surgeon: Manny, Theodore, MD;  [facility];  Service: Urology;  Laterality: Bilateral;  . POLYPECTOMY  01/03/2020   Procedure: POLYPECTOMY;  Surgeon: Rehman, Najeeb U, MD;  [facility];  Service: Endoscopy;;  . ROBOT ASSISTED LAPAROSCOPIC RADICAL PROSTATECTOMY N/A 01/23/2020   Procedure: XI ROBOTIC ASSISTED LAPAROSCOPIC RADICAL PROSTATECTOMY;  Surgeon: Manny, Theodore, MD;  [facility];  Service: Urology;  Laterality: N/A;  3 HRS    Home Medications:  Allergies as of 03/05/2020   No Known Allergies     Medication List       Accurate as of March 05, 2020  9:31 AM. If you have any questions, ask your nurse or doctor.        atorvastatin 10 MG tablet Commonly known as: LIPITOR TAKE 1 TABLET BY MOUTH EVERY DAY   blood glucose meter kit and supplies Dispense based on patient and insurance preference. Use to check sugars once a day (FOR ICD-10 E10.9, E11.9).   HYDROcodone-acetaminophen 5-325 MG tablet Commonly known as: Norco Take 1-2 tablets by mouth every 6 (six) hours as  needed for moderate pain.   lisinopril 10 MG tablet Commonly known as: ZESTRIL TAKE 1 TABLET BY MOUTH EVERY DAY   metFORMIN 500 MG tablet Commonly known as: GLUCOPHAGE TAKE 2 TABLETS BY MOUTH TWICE A DAY What changed: when to take this   nitroGLYCERIN 0.4 MG SL tablet Commonly known as: NITROSTAT Place 1 tablet (0.4 mg total) under the tongue every 5 (five) minutes as needed for chest pain.   OneTouch Delica Plus [id number] Misc USE TO OBTAIN A BLOOD SPECIMEN TO TEST BLOOD SUGAR DAILY   OneTouch Ultra test strip Generic drug: glucose blood USE TO TEST BLOOD SUGAR DAILY   sulfamethoxazole-trimethoprim 800-160 MG tablet Commonly known as: BACTRIM DS Take 1 tablet by mouth 2 (two) times daily. Start the day prior to foley removal appointment       Allergies: No Known Allergies  Family History: Family History  Problem Relation Age of Onset  . Diabetes Father   . Heart disease Mother   . Cancer Other   . Breast cancer Neg Hx   . Prostate cancer Neg Hx   . Colon cancer Neg Hx   . Pancreatic cancer Neg Hx     Social History:  reports that he has never smoked. He has never used smokeless tobacco. He reports that he does not drink alcohol or use drugs.  ROS: All other review of systems were reviewed and are negative except what   is noted above in HPI  Physical Exam: BP 106/72   Pulse 87   Temp 98.1 F (36.7 C)   Ht 6' 2" (1.88 m)   Wt 258 lb (117 kg)   BMI 33.13 kg/m   Constitutional:  Alert and oriented, No acute distress. HEENT: [place] AT, moist mucus membranes.  Trachea midline, no masses. Cardiovascular: No clubbing, cyanosis, or edema. Respiratory: Normal respiratory effort, no increased work of breathing. GI: Abdomen is soft, nontender, nondistended, no abdominal masses GU: No CVA tenderness.  Lymph: No cervical or inguinal lymphadenopathy. Skin: No rashes, bruises or suspicious lesions. Neurologic: Grossly intact, no focal deficits, moving all 4  extremities. Psychiatric: Normal mood and affect.  Laboratory Data: Lab Results  Component Value Date   WBC 7.1 01/15/2020   HGB 13.9 01/24/2020   HCT 43.1 01/24/2020   MCV 92.1 01/15/2020   PLT 208 01/15/2020    Lab Results  Component Value Date   CREATININE 1.30 (H) 01/24/2020    Lab Results  Component Value Date   PSA <0.1 03/04/2020   PSA 2.3 12/06/2014    No results found for: TESTOSTERONE  Lab Results  Component Value Date   HGBA1C 7.9 (H) 01/15/2020    Urinalysis No results found for: COLORURINE, APPEARANCEUR, LABSPEC, PHURINE, GLUCOSEU, HGBUR, BILIRUBINUR, KETONESUR, PROTEINUR, UROBILINOGEN, NITRITE, LEUKOCYTESUR  Lab Results  Component Value Date   LABMICR 14.2 08/08/2018    Pertinent Imaging:  No results found for this or any previous visit. No results found for this or any previous visit. No results found for this or any previous visit. No results found for this or any previous visit. No results found for this or any previous visit. No results found for this or any previous visit. No results found for this or any previous visit. No results found for this or any previous visit.  Assessment & Plan:    1. Prostate cancer ([place]) -RTC 3 months with PSA   No follow-ups on file.  Nicolette Bang, MD  [facility] [place]

## 2020-03-05 NOTE — Patient Instructions (Signed)
Prostate Cancer  The prostate is a male gland that helps make semen. Prostate cancer is when abnormal cells grow in this gland. Follow these instructions at home:  Take over-the-counter and prescription medicines only as told by your doctor.  Eat a healthy diet.  Get plenty of sleep.  Ask your doctor for help to find a support group for men with prostate cancer.  Keep all follow-up visits as told by your doctor. This is important.  If you have to go to the hospital, let your cancer doctor (oncologist) know.  Touch, hold, hug, and caress your partner to continue to show sexual feelings. Contact a doctor if:  You have trouble peeing (urinating).  You have blood in your pee (urine).  You have pain in your hips, back, or chest. Get help right away if:  You have weakness in your legs.  You lose feeling (have numbness) in your legs.  You cannot control your pee or your poop (stool).  You have trouble breathing.  You have sudden pain in your chest.  You have chills or a fever. Summary  The prostate is a male gland that helps make semen. Prostate cancer is when abnormal cells grow in this gland.  Ask your doctor for help to find a support group for men with prostate cancer.  Contact a doctor if you have problems peeing or have any new pain that you did not have before. This information is not intended to replace advice given to you by your health care provider. Make sure you discuss any questions you have with your health care provider. Document Revised: 09/02/2017 Document Reviewed: 05/31/2016 Elsevier Patient Education  2020 Elsevier Inc.  

## 2020-03-05 NOTE — Progress Notes (Signed)

## 2020-03-07 ENCOUNTER — Other Ambulatory Visit: Payer: Self-pay | Admitting: Family Medicine

## 2020-03-11 ENCOUNTER — Other Ambulatory Visit: Payer: Self-pay

## 2020-03-11 ENCOUNTER — Encounter: Payer: Self-pay | Admitting: Family Medicine

## 2020-03-11 ENCOUNTER — Ambulatory Visit (INDEPENDENT_AMBULATORY_CARE_PROVIDER_SITE_OTHER): Payer: Medicare HMO | Admitting: Family Medicine

## 2020-03-11 VITALS — BP 114/70 | Temp 98.4°F | Wt 261.4 lb

## 2020-03-11 DIAGNOSIS — E785 Hyperlipidemia, unspecified: Secondary | ICD-10-CM

## 2020-03-11 DIAGNOSIS — Z23 Encounter for immunization: Secondary | ICD-10-CM | POA: Diagnosis not present

## 2020-03-11 DIAGNOSIS — E119 Type 2 diabetes mellitus without complications: Secondary | ICD-10-CM | POA: Diagnosis not present

## 2020-03-11 DIAGNOSIS — E7849 Other hyperlipidemia: Secondary | ICD-10-CM

## 2020-03-11 DIAGNOSIS — E1169 Type 2 diabetes mellitus with other specified complication: Secondary | ICD-10-CM | POA: Diagnosis not present

## 2020-03-11 DIAGNOSIS — I10 Essential (primary) hypertension: Secondary | ICD-10-CM | POA: Diagnosis not present

## 2020-03-11 MED ORDER — LISINOPRIL 10 MG PO TABS: 10.0000 mg | ORAL_TABLET | Freq: Every day | ORAL | 1 refills | Status: DC

## 2020-03-11 MED ORDER — METFORMIN HCL 500 MG PO TABS: 1000.0000 mg | ORAL_TABLET | Freq: Two times a day (BID) | ORAL | 1 refills | Status: DC

## 2020-03-11 MED ORDER — ATORVASTATIN CALCIUM 10 MG PO TABS: 10.0000 mg | ORAL_TABLET | Freq: Every day | ORAL | 1 refills | Status: DC

## 2020-03-11 NOTE — Progress Notes (Signed)
   Subjective:    Patient ID: Adam Lee., male    DOB: Dec 21, 1955, 64 y.o.   MRN: [id number]  Diabetes He presents for his follow-up diabetic visit. He has type 2 diabetes mellitus. Pertinent negatives for hypoglycemia include no confusion, dizziness or headaches. Pertinent negatives for diabetes include no chest pain and no fatigue. There are no diabetic complications. Current diabetic treatments: metformin, diet is pretty good. Regular exercise- walking and stretching. Weight trend: Has lost nearly 30 lbs since prostate surgery. Home blood sugar record trend: 135.  Hypertension This is a chronic problem. Pertinent negatives include no chest pain, headaches or shortness of breath. Treatments tried: lisinopril.  Hyperlipidemia This is a chronic problem. Pertinent negatives include no chest pain or shortness of breath. Treatments tried: atorvastatin.   Since prostate surgery is experiencing frequency and incontinence but this is improving quite a bit with stretching exercises.    Review of Systems  Constitutional: Negative for diaphoresis and fatigue.  HENT: Negative for congestion and rhinorrhea.   Respiratory: Negative for cough and shortness of breath.   Cardiovascular: Negative for chest pain and leg swelling.  Gastrointestinal: Negative for abdominal pain and diarrhea.  Skin: Negative for color change and rash.  Neurological: Negative for dizziness and headaches.  Psychiatric/Behavioral: Negative for behavioral problems and confusion.       Objective:   Physical Exam Vitals reviewed.  Constitutional:      General: He is not in acute distress. HENT:     Head: Normocephalic and atraumatic.  Eyes:     General:        Right eye: No discharge.        Left eye: No discharge.  Neck:     Trachea: No tracheal deviation.  Cardiovascular:     Rate and Rhythm: Normal rate and regular rhythm.     Heart sounds: Normal heart sounds. No murmur.  Pulmonary:     Effort: Pulmonary  effort is normal. No respiratory distress.     Breath sounds: Normal breath sounds.  Lymphadenopathy:     Cervical: No cervical adenopathy.  Skin:    General: Skin is warm and dry.  Neurological:     Mental Status: He is alert.     Coordination: Coordination normal.  Psychiatric:        Behavior: Behavior normal.    Fall Risk  03/11/2020 08/14/2019 12/05/2018 09/06/2018 05/13/2015  Falls in the past year? 0 0 0 0 No  Number falls in past yr: - - 0 - -  Injury with Fall? - - 0 - -  Follow up Falls evaluation completed Falls evaluation completed Falls evaluation completed - -          Assessment & Plan:  1. Essential hypertension Blood pressure good control continue current measures watch diet may be able to be on lower dose of Zestril if weight continues to go down - Basic metabolic panel  2. Type II diabetes mellitus, well controlled ([place]) A1c will be rechecked in July await the results.  Watch diet continue medication - Hemoglobin A1c - Microalbumin / creatinine urine ratio  3. Need for vaccination Pneumonia vaccine today - Pneumococcal polysaccharide vaccine 23-valent greater than or equal to 2yo subcutaneous/IM  4. Other hyperlipidemia Continue cholesterol medicine await lab work associated with diabetes - Lipid panel Covid vaccine recommended follow-up on weight this year

## 2020-03-13 DIAGNOSIS — N393 Stress incontinence (female) (male): Secondary | ICD-10-CM | POA: Diagnosis not present

## 2020-03-13 DIAGNOSIS — M62838 Other muscle spasm: Secondary | ICD-10-CM | POA: Diagnosis not present

## 2020-03-13 DIAGNOSIS — M6281 Muscle weakness (generalized): Secondary | ICD-10-CM | POA: Diagnosis not present

## 2020-04-01 ENCOUNTER — Other Ambulatory Visit: Payer: Self-pay | Admitting: Family Medicine

## 2020-04-02 DIAGNOSIS — M6281 Muscle weakness (generalized): Secondary | ICD-10-CM | POA: Diagnosis not present

## 2020-04-02 DIAGNOSIS — N393 Stress incontinence (female) (male): Secondary | ICD-10-CM | POA: Diagnosis not present

## 2020-04-02 DIAGNOSIS — M62838 Other muscle spasm: Secondary | ICD-10-CM | POA: Diagnosis not present

## 2020-04-25 DIAGNOSIS — E7849 Other hyperlipidemia: Secondary | ICD-10-CM | POA: Diagnosis not present

## 2020-04-25 DIAGNOSIS — I10 Essential (primary) hypertension: Secondary | ICD-10-CM | POA: Diagnosis not present

## 2020-04-25 DIAGNOSIS — E119 Type 2 diabetes mellitus without complications: Secondary | ICD-10-CM | POA: Diagnosis not present

## 2020-04-26 LAB — HEMOGLOBIN A1C
Est. average glucose Bld gHb Est-mCnc: 140 mg/dL
Hgb A1c MFr Bld: 6.5 % — ABNORMAL HIGH (ref 4.8–5.6)

## 2020-04-26 LAB — BASIC METABOLIC PANEL
BUN/Creatinine Ratio: 14 (ref 10–24)
BUN: 20 mg/dL (ref 8–27)
CO2: 24 mmol/L (ref 20–29)
Calcium: 9.4 mg/dL (ref 8.6–10.2)
Chloride: 101 mmol/L (ref 96–106)
Creatinine, Ser: 1.39 mg/dL — ABNORMAL HIGH (ref 0.76–1.27)
GFR calc Af Amer: 61 mL/min/{1.73_m2} (ref >59)
GFR calc non Af Amer: 52 mL/min/{1.73_m2} — ABNORMAL LOW (ref >59)
Glucose: 125 mg/dL — ABNORMAL HIGH (ref 65–99)
Potassium: 4.4 mmol/L (ref 3.5–5.2)
Sodium: 138 mmol/L (ref 134–144)

## 2020-04-26 LAB — LIPID PANEL
Chol/HDL Ratio: 2.4 ratio (ref 0.0–5.0)
Cholesterol, Total: 120 mg/dL (ref 100–199)
HDL: 50 mg/dL (ref >39)
LDL Chol Calc (NIH): 51 mg/dL (ref 0–99)
Triglycerides: 103 mg/dL (ref 0–149)
VLDL Cholesterol Cal: 19 mg/dL (ref 5–40)

## 2020-04-26 LAB — MICROALBUMIN / CREATININE URINE RATIO
Creatinine, Urine: 91.6 mg/dL
Microalb/Creat Ratio: 59 mg/g creat — ABNORMAL HIGH (ref 0–29)
Microalbumin, Urine: 54 ug/mL

## 2020-04-30 ENCOUNTER — Other Ambulatory Visit: Payer: Self-pay | Admitting: *Deleted

## 2020-04-30 DIAGNOSIS — E119 Type 2 diabetes mellitus without complications: Secondary | ICD-10-CM

## 2020-04-30 DIAGNOSIS — I10 Essential (primary) hypertension: Secondary | ICD-10-CM

## 2020-05-01 DIAGNOSIS — M62838 Other muscle spasm: Secondary | ICD-10-CM | POA: Diagnosis not present

## 2020-05-01 DIAGNOSIS — N393 Stress incontinence (female) (male): Secondary | ICD-10-CM | POA: Diagnosis not present

## 2020-05-01 DIAGNOSIS — M6281 Muscle weakness (generalized): Secondary | ICD-10-CM | POA: Diagnosis not present

## 2020-05-01 DIAGNOSIS — C61 Malignant neoplasm of prostate: Secondary | ICD-10-CM | POA: Diagnosis not present

## 2020-05-13 DIAGNOSIS — N393 Stress incontinence (female) (male): Secondary | ICD-10-CM | POA: Diagnosis not present

## 2020-05-13 DIAGNOSIS — N5231 Erectile dysfunction following radical prostatectomy: Secondary | ICD-10-CM | POA: Diagnosis not present

## 2020-05-13 DIAGNOSIS — C61 Malignant neoplasm of prostate: Secondary | ICD-10-CM | POA: Diagnosis not present

## 2020-05-30 DIAGNOSIS — R69 Illness, unspecified: Secondary | ICD-10-CM | POA: Diagnosis not present

## 2020-06-04 DIAGNOSIS — M6281 Muscle weakness (generalized): Secondary | ICD-10-CM | POA: Diagnosis not present

## 2020-06-04 DIAGNOSIS — N393 Stress incontinence (female) (male): Secondary | ICD-10-CM | POA: Diagnosis not present

## 2020-06-04 DIAGNOSIS — M62838 Other muscle spasm: Secondary | ICD-10-CM | POA: Diagnosis not present

## 2020-06-06 ENCOUNTER — Other Ambulatory Visit: Payer: Self-pay

## 2020-06-06 ENCOUNTER — Other Ambulatory Visit: Payer: Medicare HMO

## 2020-06-06 DIAGNOSIS — C61 Malignant neoplasm of prostate: Secondary | ICD-10-CM | POA: Diagnosis not present

## 2020-06-07 LAB — PSA: Prostate Specific Ag, Serum: 0.1 ng/mL (ref 0.0–4.0)

## 2020-06-10 NOTE — Progress Notes (Signed)
Results mailed 

## 2020-06-11 ENCOUNTER — Ambulatory Visit (INDEPENDENT_AMBULATORY_CARE_PROVIDER_SITE_OTHER): Payer: Medicare HMO | Admitting: Urology

## 2020-06-11 ENCOUNTER — Encounter: Payer: Self-pay | Admitting: Urology

## 2020-06-11 ENCOUNTER — Other Ambulatory Visit: Payer: Self-pay

## 2020-06-11 VITALS — BP 133/64 | HR 84 | Temp 97.9°F | Ht 74.0 in | Wt 261.4 lb

## 2020-06-11 DIAGNOSIS — C61 Malignant neoplasm of prostate: Secondary | ICD-10-CM | POA: Diagnosis not present

## 2020-06-11 LAB — URINALYSIS, ROUTINE W REFLEX MICROSCOPIC
Bilirubin, UA: NEGATIVE
Glucose, UA: NEGATIVE
Ketones, UA: NEGATIVE
Leukocytes,UA: NEGATIVE
Nitrite, UA: NEGATIVE
Protein,UA: NEGATIVE
RBC, UA: NEGATIVE
Specific Gravity, UA: 1.02 (ref 1.005–1.030)
Urobilinogen, Ur: 0.2 mg/dL (ref 0.2–1.0)
pH, UA: 6 (ref 5.0–7.5)

## 2020-06-11 NOTE — Patient Instructions (Signed)
Prostate Cancer  The prostate is a male gland that helps make semen. Prostate cancer is when abnormal cells grow in this gland. Follow these instructions at home:  Take over-the-counter and prescription medicines only as told by your doctor.  Eat a healthy diet.  Get plenty of sleep.  Ask your doctor for help to find a support group for men with prostate cancer.  Keep all follow-up visits as told by your doctor. This is important.  If you have to go to the hospital, let your cancer doctor (oncologist) know.  Touch, hold, hug, and caress your partner to continue to show sexual feelings. Contact a doctor if:  You have trouble peeing (urinating).  You have blood in your pee (urine).  You have pain in your hips, back, or chest. Get help right away if:  You have weakness in your legs.  You lose feeling (have numbness) in your legs.  You cannot control your pee or your poop (stool).  You have trouble breathing.  You have sudden pain in your chest.  You have chills or a fever. Summary  The prostate is a male gland that helps make semen. Prostate cancer is when abnormal cells grow in this gland.  Ask your doctor for help to find a support group for men with prostate cancer.  Contact a doctor if you have problems peeing or have any new pain that you did not have before. This information is not intended to replace advice given to you by your health care provider. Make sure you discuss any questions you have with your health care provider. Document Revised: 09/02/2017 Document Reviewed: 05/31/2016 Elsevier Patient Education  2020 Elsevier Inc.  

## 2020-06-11 NOTE — Progress Notes (Signed)

## 2020-06-11 NOTE — Progress Notes (Signed)
06/11/2020 10:51 AM   Adam Lee. 1956-03-05 [id number]  Referring provider: Kathyrn Drown, MD [place],  [place] [address]  followup prostate cancer  HPI: [name] is a 58RV here for followup for prostate cancer. PSA increased from undetectable to 0.1. His urinary incontinence is improving and he is using 3-4 pads per day. No other significant LUTS. No dysuria or hematuria. No bone pain   PMH: Past Medical History:  Diagnosis Date   Anginal pain ([place]) 2019   Diabetes mellitus without complication ([place])    Dysrhythmia    stress related. fast HR   History of stress test 2014   intermediate risk   Hyperlipemia    Hypertension    Prostate cancer [facility])     Surgical History: Past Surgical History:  Procedure Laterality Date   COLONOSCOPY N/A 01/03/2020   Procedure: COLONOSCOPY;  Surgeon: Rogene Houston, MD;  [facility];  Service: Endoscopy;  Laterality: N/A;  [place] Left    2000, Dr. Aviva Signs   LYMPHADENECTOMY Bilateral 01/23/2020   Procedure: LYMPHADENECTOMY;  Surgeon: Alexis Frock, MD;  [facility];  Service: Urology;  Laterality: Bilateral;   POLYPECTOMY  01/03/2020   Procedure: POLYPECTOMY;  Surgeon: Rogene Houston, MD;  [facility];  Service: Endoscopy;;   ROBOT ASSISTED LAPAROSCOPIC RADICAL PROSTATECTOMY N/A 01/23/2020   Procedure: XI ROBOTIC ASSISTED LAPAROSCOPIC RADICAL PROSTATECTOMY;  Surgeon: Alexis Frock, MD;  [facility];  Service: Urology;  Laterality: N/A;  3 HRS    Home Medications:  Allergies as of 06/11/2020   No Known Allergies     Medication List       Accurate as of June 11, 2020 10:51 AM. If you have any questions, ask your nurse or doctor.        atorvastatin 10 MG tablet Commonly known as: LIPITOR Take 1 tablet (10 mg total) by mouth daily.   blood glucose meter kit and supplies Dispense based on patient and insurance preference. Use to  check sugars once a day (FOR ICD-10 E10.9, E11.9).   HYDROcodone-acetaminophen 5-325 MG tablet Commonly known as: Norco Take 1-2 tablets by mouth every 6 (six) hours as needed for moderate pain.   lisinopril 10 MG tablet Commonly known as: ZESTRIL Take 1 tablet (10 mg total) by mouth daily.   metFORMIN 500 MG tablet Commonly known as: GLUCOPHAGE Take 2 tablets (1,000 mg total) by mouth 2 (two) times daily.   nitroGLYCERIN 0.4 MG SL tablet Commonly known as: NITROSTAT Place 1 tablet (0.4 mg total) under the tongue every 5 (five) minutes as needed for chest pain.   OneTouch Delica Plus [id number] Misc USE TO OBTAIN A BLOOD SPECIMEN TO TEST BLOOD SUGAR DAILY   OneTouch Ultra test strip Generic drug: glucose blood USE TO TEST BLOOD SUGAR DAILY       Allergies: No Known Allergies  Family History: Family History  Problem Relation Age of Onset   Diabetes Father    Heart disease Mother    Cancer Other    Breast cancer Neg Hx    Prostate cancer Neg Hx    Colon cancer Neg Hx    Pancreatic cancer Neg Hx     Social History:  reports that he has never smoked. He has never used smokeless tobacco. He reports that he does not drink alcohol and does not use drugs.  ROS: All other review of systems were reviewed and are negative  except what is noted above in HPI  Physical Exam: BP 133/64    Pulse 84    Temp 97.9 F (36.6 C)    Ht _0  (1.88 m)    Wt 261 lb 6.4 oz (118.6 kg)    BMI 33.56 kg/m   Constitutional:  Alert and oriented, No acute distress. HEENT: [place] AT, moist mucus membranes.  Trachea midline, no masses. Cardiovascular: No clubbing, cyanosis, or edema. Respiratory: Normal respiratory effort, no increased work of breathing. GI: Abdomen is soft, nontender, nondistended, no abdominal masses GU: No CVA tenderness.  Lymph: No cervical or inguinal lymphadenopathy. Skin: No rashes, bruises or suspicious lesions. Neurologic: Grossly intact, no focal deficits, moving  all 4 extremities. Psychiatric: Normal mood and affect.  Laboratory Data: Lab Results  Component Value Date   WBC 7.1 01/15/2020   HGB 13.9 01/24/2020   HCT 43.1 01/24/2020   MCV 92.1 01/15/2020   PLT 208 01/15/2020    Lab Results  Component Value Date   CREATININE 1.39 (H) 04/25/2020    Lab Results  Component Value Date   PSA <0.1 03/04/2020   PSA 2.3 12/06/2014    No results found for: TESTOSTERONE  Lab Results  Component Value Date   HGBA1C 6.5 (H) 04/25/2020    Urinalysis No results found for: COLORURINE, APPEARANCEUR, LABSPEC, PHURINE, GLUCOSEU, HGBUR, BILIRUBINUR, KETONESUR, PROTEINUR, UROBILINOGEN, NITRITE, LEUKOCYTESUR  Lab Results  Component Value Date   LABMICR 54.0 04/25/2020    Pertinent Imaging:  No results found for this or any previous visit.  No results found for this or any previous visit.  No results found for this or any previous visit.  No results found for this or any previous visit.  No results found for this or any previous visit.  No results found for this or any previous visit.  No results found for this or any previous visit.  No results found for this or any previous visit.   Assessment & Plan:    1. Prostate cancer [facility]) -Message sent to Dr. Alfredo Bach office to have patient seen sooner than Janurary 2022 to start adjuvant radition -RTC 3 months - Urinalysis, Routine w reflex microscopic   No follow-ups on file.  Nicolette Bang, MD  [facility] [place]

## 2020-06-13 ENCOUNTER — Telehealth: Payer: Self-pay | Admitting: *Deleted

## 2020-06-13 NOTE — Telephone Encounter (Signed)
CALLED PATIENT TO INFORM OF ADT  APPT. WITH DR. Alyson Ingles IN [place] ON 07-03-20- ARRIVAL TIME- 10:45 AM, SPOKE WITH PATIENT AND HE IS AWARE OF THIS APPT.

## 2020-06-30 ENCOUNTER — Telehealth: Payer: Self-pay | Admitting: *Deleted

## 2020-06-30 NOTE — Telephone Encounter (Signed)
CALLED PATIENT TO INFORM OF SIM APPT. ON 07-08-20 @ 1:30 PM , LVM FOR A RETURN CALL

## 2020-07-03 ENCOUNTER — Encounter: Payer: Self-pay | Admitting: Urology

## 2020-07-03 ENCOUNTER — Encounter: Payer: Self-pay | Admitting: Medical Oncology

## 2020-07-03 ENCOUNTER — Other Ambulatory Visit: Payer: Self-pay

## 2020-07-03 ENCOUNTER — Ambulatory Visit (INDEPENDENT_AMBULATORY_CARE_PROVIDER_SITE_OTHER): Payer: Medicare HMO | Admitting: Urology

## 2020-07-03 VITALS — BP 120/74 | HR 93 | Temp 98.8°F | Ht 72.0 in | Wt 261.0 lb

## 2020-07-03 DIAGNOSIS — C61 Malignant neoplasm of prostate: Secondary | ICD-10-CM

## 2020-07-03 MED ORDER — DEGARELIX ACETATE(240 MG DOSE) 120 MG/VIAL ~~LOC~~ SOLR
240.0000 mg | Freq: Once | SUBCUTANEOUS | Status: AC
Start: 2020-07-03 — End: 2020-07-03
  Administered 2020-07-03: 240 mg via SUBCUTANEOUS

## 2020-07-03 NOTE — Patient Instructions (Signed)
Prostate Cancer  The prostate is a male gland that helps make semen. Prostate cancer is when abnormal cells grow in this gland. Follow these instructions at home:  Take over-the-counter and prescription medicines only as told by your doctor.  Eat a healthy diet.  Get plenty of sleep.  Ask your doctor for help to find a support group for men with prostate cancer.  Keep all follow-up visits as told by your doctor. This is important.  If you have to go to the hospital, let your cancer doctor (oncologist) know.  Touch, hold, hug, and caress your partner to continue to show sexual feelings. Contact a doctor if:  You have trouble peeing (urinating).  You have blood in your pee (urine).  You have pain in your hips, back, or chest. Get help right away if:  You have weakness in your legs.  You lose feeling (have numbness) in your legs.  You cannot control your pee or your poop (stool).  You have trouble breathing.  You have sudden pain in your chest.  You have chills or a fever. Summary  The prostate is a male gland that helps make semen. Prostate cancer is when abnormal cells grow in this gland.  Ask your doctor for help to find a support group for men with prostate cancer.  Contact a doctor if you have problems peeing or have any new pain that you did not have before. This information is not intended to replace advice given to you by your health care provider. Make sure you discuss any questions you have with your health care provider. Document Revised: 09/02/2017 Document Reviewed: 05/31/2016 Elsevier Patient Education  2020 Elsevier Inc.  

## 2020-07-03 NOTE — Progress Notes (Signed)
07/03/2020 11:25 AM   Adam Lee. 1956/02/04 [id number]  Referring provider: Kathyrn Drown, MD [place],  [place] [address]  Prostate Cancer   HPI: Adam Lee is a [phone] here for followup for prostate cancer. He is scheduled to start radiation in 1 month. Mild SUI. No bone pain. NO worsening urinary urgency, frequency or urge incontinence. He is here to initiate ADT   PMH: Past Medical History:  Diagnosis Date  . Anginal pain ([place]) 2019  . Diabetes mellitus without complication ([place])   . Dysrhythmia    stress related. fast HR  . History of stress test 2014   intermediate risk  . Hyperlipemia   . Hypertension   . Prostate cancer [facility])     Surgical History: Past Surgical History:  Procedure Laterality Date  . COLONOSCOPY N/A 01/03/2020   Procedure: COLONOSCOPY;  Surgeon: Rogene Houston, MD;  [facility];  Service: Endoscopy;  Laterality: N/A;  1055  . HERNIA REPAIR Left    2000, Dr. Aviva Signs  . LYMPHADENECTOMY Bilateral 01/23/2020   Procedure: LYMPHADENECTOMY;  Surgeon: Alexis Frock, MD;  [facility];  Service: Urology;  Laterality: Bilateral;  . POLYPECTOMY  01/03/2020   Procedure: POLYPECTOMY;  Surgeon: Rogene Houston, MD;  [facility];  Service: Endoscopy;;  . ROBOT ASSISTED LAPAROSCOPIC RADICAL PROSTATECTOMY N/A 01/23/2020   Procedure: XI ROBOTIC ASSISTED LAPAROSCOPIC RADICAL PROSTATECTOMY;  Surgeon: Alexis Frock, MD;  [facility];  Service: Urology;  Laterality: N/A;  3 HRS    Home Medications:  Allergies as of 07/03/2020   No Known Allergies     Medication List       Accurate as of July 03, 2020 11:25 AM. If you have any questions, ask your nurse or doctor.        atorvastatin 10 MG tablet Commonly known as: LIPITOR Take 1 tablet (10 mg total) by mouth daily.   blood glucose meter kit and supplies Dispense based on patient and insurance preference. Use to check sugars once a day  (FOR ICD-10 E10.9, E11.9).   HYDROcodone-acetaminophen 5-325 MG tablet Commonly known as: Norco Take 1-2 tablets by mouth every 6 (six) hours as needed for moderate pain.   lisinopril 10 MG tablet Commonly known as: ZESTRIL Take 1 tablet (10 mg total) by mouth daily.   metFORMIN 500 MG tablet Commonly known as: GLUCOPHAGE Take 2 tablets (1,000 mg total) by mouth 2 (two) times daily.   nitroGLYCERIN 0.4 MG SL tablet Commonly known as: NITROSTAT Place 1 tablet (0.4 mg total) under the tongue every 5 (five) minutes as needed for chest pain.   OneTouch Delica Plus [id number] Misc USE TO OBTAIN A BLOOD SPECIMEN TO TEST BLOOD SUGAR DAILY   OneTouch Ultra test strip Generic drug: glucose blood USE TO TEST BLOOD SUGAR DAILY       Allergies: No Known Allergies  Family History: Family History  Problem Relation Age of Onset  . Diabetes Father   . Heart disease Mother   . Cancer Other   . Breast cancer Neg Hx   . Prostate cancer Neg Hx   . Colon cancer Neg Hx   . Pancreatic cancer Neg Hx     Social History:  reports that he has never smoked. He has never used smokeless tobacco. He reports that he does not drink alcohol and does not use drugs.  ROS: All other review of systems were reviewed and are negative except what  is noted above in HPI  Physical Exam: BP 120/74   Pulse 93   Temp 98.8 F (37.1 C)   Ht 6' (1.829 m)   Wt 261 lb (118.4 kg)   BMI 35.40 kg/m   Constitutional:  Alert and oriented, No acute distress. HEENT: [place] AT, moist mucus membranes.  Trachea midline, no masses. Cardiovascular: No clubbing, cyanosis, or edema. Respiratory: Normal respiratory effort, no increased work of breathing. GI: Abdomen is soft, nontender, nondistended, no abdominal masses GU: No CVA tenderness.  Lymph: No cervical or inguinal lymphadenopathy. Skin: No rashes, bruises or suspicious lesions. Neurologic: Grossly intact, no focal deficits, moving all 4 extremities. Psychiatric:  Normal mood and affect.  Laboratory Data: Lab Results  Component Value Date   WBC 7.1 01/15/2020   HGB 13.9 01/24/2020   HCT 43.1 01/24/2020   MCV 92.1 01/15/2020   PLT 208 01/15/2020    Lab Results  Component Value Date   CREATININE 1.39 (H) 04/25/2020    Lab Results  Component Value Date   PSA <0.1 03/04/2020   PSA 2.3 12/06/2014    No results found for: TESTOSTERONE  Lab Results  Component Value Date   HGBA1C 6.5 (H) 04/25/2020    Urinalysis    Component Value Date/Time   APPEARANCEUR Clear 06/11/2020 1010   GLUCOSEU Negative 06/11/2020 1010   BILIRUBINUR Negative 06/11/2020 1010   PROTEINUR Negative 06/11/2020 1010   NITRITE Negative 06/11/2020 1010   LEUKOCYTESUR Negative 06/11/2020 1010    Lab Results  Component Value Date   LABMICR Comment 06/11/2020    Pertinent Imaging:  No results found for this or any previous visit.  No results found for this or any previous visit.  No results found for this or any previous visit.  No results found for this or any previous visit.  No results found for this or any previous visit.  No results found for this or any previous visit.  No results found for this or any previous visit.  No results found for this or any previous visit.   Assessment & Plan:    1. Prostate cancer ([place]) -Firmagon 258m today -RTC 1 month for firmagon 80 and then 2 months for LUpron 331m- Urinalysis, Routine w reflex microscopic   No follow-ups on file.  PaNicolette BangMD  [facility] [place]

## 2020-07-03 NOTE — Progress Notes (Signed)

## 2020-07-07 ENCOUNTER — Telehealth: Payer: Self-pay | Admitting: *Deleted

## 2020-07-07 NOTE — Telephone Encounter (Signed)
Called patient to remind of sim appt. For 07-08-20 - arrival time- 1:15 pm, lvm for a return call

## 2020-07-08 ENCOUNTER — Other Ambulatory Visit: Payer: Self-pay

## 2020-07-08 ENCOUNTER — Ambulatory Visit
Admission: RE | Admit: 2020-07-08 | Discharge: 2020-07-08 | Disposition: A | Discharge status: Home / Self Care | Payer: Medicare HMO | Source: Ambulatory Visit | Attending: Radiation Oncology | Admitting: Radiation Oncology

## 2020-07-08 DIAGNOSIS — C61 Malignant neoplasm of prostate: Secondary | ICD-10-CM

## 2020-07-09 DIAGNOSIS — M62838 Other muscle spasm: Secondary | ICD-10-CM | POA: Diagnosis not present

## 2020-07-09 DIAGNOSIS — M6281 Muscle weakness (generalized): Secondary | ICD-10-CM | POA: Diagnosis not present

## 2020-07-09 DIAGNOSIS — N393 Stress incontinence (female) (male): Secondary | ICD-10-CM | POA: Diagnosis not present

## 2020-07-10 ENCOUNTER — Encounter: Payer: Self-pay | Admitting: Urology

## 2020-07-10 NOTE — Progress Notes (Signed)
This patient came in for CT SIM on 07/08/20 but reported that he was told that he should not start treatment until 2 months after ADT, which he received on 07/03/20, so he was not planning to start until after he sees Dr. Alyson Ingles in December 2021. I have reached out to Dr. Alyson Ingles for confirmation and clarification and he has advised that the patient is indeed ready to move forward with radiation since he is a prostate fossa only, not treating intact prostate. Cira Rue, RN will reach back out to the patient to provide clarification and if the patient is in agreement, we will go ahead and get him rescheduled for CT SIM. Consent has already been signed and is in his medical record.  Nicholos Johns, MMS, PA-C [place] [facility] [place]: [phone]  Fax: [phone]

## 2020-07-11 ENCOUNTER — Encounter: Payer: Self-pay | Admitting: Medical Oncology

## 2020-07-11 NOTE — Progress Notes (Signed)
Spoke with patient to inform her is does not need to wait 8 weeks post hormone injection because he post prostatectomy. I asked if he would like to reschedule CT simulation and move forward with radiation. He states he would. I sent CT simulation a request to reschedule.

## 2020-07-28 ENCOUNTER — Ambulatory Visit
Admission: RE | Admit: 2020-07-28 | Discharge: 2020-07-28 | Disposition: A | Discharge status: Home / Self Care | Payer: Medicare HMO | Source: Ambulatory Visit | Attending: Radiation Oncology | Admitting: Radiation Oncology

## 2020-07-28 ENCOUNTER — Other Ambulatory Visit: Payer: Self-pay

## 2020-07-28 DIAGNOSIS — C61 Malignant neoplasm of prostate: Secondary | ICD-10-CM

## 2020-07-31 ENCOUNTER — Other Ambulatory Visit: Payer: Self-pay

## 2020-07-31 ENCOUNTER — Ambulatory Visit (INDEPENDENT_AMBULATORY_CARE_PROVIDER_SITE_OTHER): Payer: Medicare HMO

## 2020-07-31 DIAGNOSIS — C61 Malignant neoplasm of prostate: Secondary | ICD-10-CM

## 2020-07-31 MED ORDER — DEGARELIX ACETATE 80 MG ~~LOC~~ SOLR: 80.0000 mg | Freq: Once | SUBCUTANEOUS | Status: DC

## 2020-07-31 MED ORDER — DEGARELIX ACETATE 80 MG ~~LOC~~ SOLR
80.0000 mg | Freq: Once | SUBCUTANEOUS | Status: AC
  Administered 2020-07-31: 80 mg via SUBCUTANEOUS

## 2020-07-31 NOTE — Progress Notes (Signed)
Firmagon Sub Q Injection  Due to Prostate Cancer patient is present today for a Firmagon Injection.   Medication: Mills Koller (Degarelix)  Dose: 80mg  Location: left upper abdomen Lot: [id number] Exp: 04/2022  Patient tolerated well, no complications were noted  Performed by: Gaylen Pereira, LPN  Follow up: Keep next scheduled [place]

## 2020-08-02 NOTE — Addendum Note (Signed)
Encounter addended by: Tyler Pita, MD on: 08/02/2020 3:54 PM  Actions taken: Delete clinical note

## 2020-08-02 NOTE — Progress Notes (Signed)
  Radiation Oncology         [phone]) [phone] ________________________________  Name: Adam Lee. MRN: [id number]  Date: 07/28/2020  DOB: 1956-02-03  SIMULATION AND TREATMENT PLANNING NOTE    ICD-10-CM   1. Prostate cancer [facility])  C61     DIAGNOSIS:   64 y.o. gentleman with adverse pathology s/p RALP 01/2020 for stage pT3b, pN0 Gleason 4+5 prostate cancer with pretreatment PSA of 6.8.and post RALP PSA of 1.2  NARRATIVE:  The patient was brought to the [facility].  Identity was confirmed.  All relevant records and images related to the planned course of therapy were reviewed.  The patient freely provided informed written consent to proceed with treatment after reviewing the details related to the planned course of therapy. The consent form was witnessed and verified by the simulation staff.  Then, the patient was set-up in a stable reproducible supine position for radiation therapy.  A vacuum lock pillow device was custom fabricated to position his legs in a reproducible immobilized position.  Then, I performed a urethrogram under sterile conditions to identify the prostatic bed.  CT images were obtained.  Surface markings were placed.  The CT images were loaded into the planning software.  Then the prostate bed target, pelvic lymph node target and avoidance structures including the rectum, bladder, bowel and hips were contoured.  Treatment planning then occurred.  The radiation prescription was entered and confirmed.  A total of one complex treatment devices were fabricated. I have requested : Intensity Modulated Radiotherapy (IMRT) is medically necessary for this case for the following reason:  Rectal sparing.Marland Kitchen  PLAN:  The patient will receive 45 Gy in 25 fractions of 1.8 Gy, followed by a boost to the prostate bed to a total dose of 68.4 Gy with 13 additional fractions of 1.8 Gy.   ________________________________  Sheral Apley Tammi Klippel, M.D.

## 2020-08-04 DIAGNOSIS — Z510 Encounter for antineoplastic radiation therapy: Secondary | ICD-10-CM | POA: Insufficient documentation

## 2020-08-04 DIAGNOSIS — C61 Malignant neoplasm of prostate: Secondary | ICD-10-CM | POA: Insufficient documentation

## 2020-08-05 ENCOUNTER — Encounter: Payer: Self-pay | Admitting: Medical Oncology

## 2020-08-05 ENCOUNTER — Ambulatory Visit
Admission: RE | Admit: 2020-08-05 | Discharge: 2020-08-05 | Disposition: A | Discharge status: Home / Self Care | Payer: Medicare HMO | Source: Ambulatory Visit | Attending: Radiation Oncology | Admitting: Radiation Oncology

## 2020-08-05 DIAGNOSIS — C61 Malignant neoplasm of prostate: Secondary | ICD-10-CM | POA: Diagnosis not present

## 2020-08-05 DIAGNOSIS — Z510 Encounter for antineoplastic radiation therapy: Secondary | ICD-10-CM | POA: Diagnosis not present

## 2020-08-06 ENCOUNTER — Ambulatory Visit
Admission: RE | Admit: 2020-08-06 | Discharge: 2020-08-06 | Disposition: A | Discharge status: Home / Self Care | Payer: Medicare HMO | Source: Ambulatory Visit | Attending: Radiation Oncology | Admitting: Radiation Oncology

## 2020-08-06 ENCOUNTER — Other Ambulatory Visit: Payer: Self-pay

## 2020-08-06 DIAGNOSIS — Z510 Encounter for antineoplastic radiation therapy: Secondary | ICD-10-CM | POA: Diagnosis not present

## 2020-08-06 DIAGNOSIS — C61 Malignant neoplasm of prostate: Secondary | ICD-10-CM | POA: Diagnosis not present

## 2020-08-07 ENCOUNTER — Ambulatory Visit
Admission: RE | Admit: 2020-08-07 | Discharge: 2020-08-07 | Disposition: A | Discharge status: Home / Self Care | Payer: Medicare HMO | Source: Ambulatory Visit | Attending: Radiation Oncology | Admitting: Radiation Oncology

## 2020-08-07 DIAGNOSIS — C61 Malignant neoplasm of prostate: Secondary | ICD-10-CM | POA: Diagnosis not present

## 2020-08-07 DIAGNOSIS — Z510 Encounter for antineoplastic radiation therapy: Secondary | ICD-10-CM | POA: Diagnosis not present

## 2020-08-08 ENCOUNTER — Ambulatory Visit
Admission: RE | Admit: 2020-08-08 | Discharge: 2020-08-08 | Disposition: A | Discharge status: Home / Self Care | Payer: Medicare HMO | Source: Ambulatory Visit | Attending: Radiation Oncology | Admitting: Radiation Oncology

## 2020-08-08 DIAGNOSIS — C61 Malignant neoplasm of prostate: Secondary | ICD-10-CM | POA: Diagnosis not present

## 2020-08-08 DIAGNOSIS — Z510 Encounter for antineoplastic radiation therapy: Secondary | ICD-10-CM | POA: Diagnosis not present

## 2020-08-11 ENCOUNTER — Ambulatory Visit
Admission: RE | Admit: 2020-08-11 | Discharge: 2020-08-11 | Disposition: A | Discharge status: Home / Self Care | Payer: Medicare HMO | Source: Ambulatory Visit | Attending: Radiation Oncology | Admitting: Radiation Oncology

## 2020-08-11 DIAGNOSIS — C61 Malignant neoplasm of prostate: Secondary | ICD-10-CM | POA: Diagnosis not present

## 2020-08-11 DIAGNOSIS — Z510 Encounter for antineoplastic radiation therapy: Secondary | ICD-10-CM | POA: Diagnosis not present

## 2020-08-12 ENCOUNTER — Ambulatory Visit
Admission: RE | Admit: 2020-08-12 | Discharge: 2020-08-12 | Disposition: A | Discharge status: Home / Self Care | Payer: Medicare HMO | Source: Ambulatory Visit | Attending: Radiation Oncology | Admitting: Radiation Oncology

## 2020-08-12 DIAGNOSIS — C61 Malignant neoplasm of prostate: Secondary | ICD-10-CM | POA: Diagnosis not present

## 2020-08-12 DIAGNOSIS — Z510 Encounter for antineoplastic radiation therapy: Secondary | ICD-10-CM | POA: Diagnosis not present

## 2020-08-13 ENCOUNTER — Ambulatory Visit
Admission: RE | Admit: 2020-08-13 | Discharge: 2020-08-13 | Disposition: A | Discharge status: Home / Self Care | Payer: Medicare HMO | Source: Ambulatory Visit | Attending: Radiation Oncology | Admitting: Radiation Oncology

## 2020-08-13 DIAGNOSIS — Z510 Encounter for antineoplastic radiation therapy: Secondary | ICD-10-CM | POA: Diagnosis not present

## 2020-08-13 DIAGNOSIS — M62838 Other muscle spasm: Secondary | ICD-10-CM | POA: Diagnosis not present

## 2020-08-13 DIAGNOSIS — C61 Malignant neoplasm of prostate: Secondary | ICD-10-CM | POA: Diagnosis not present

## 2020-08-13 DIAGNOSIS — M6281 Muscle weakness (generalized): Secondary | ICD-10-CM | POA: Diagnosis not present

## 2020-08-14 ENCOUNTER — Ambulatory Visit
Admission: RE | Admit: 2020-08-14 | Discharge: 2020-08-14 | Disposition: A | Discharge status: Home / Self Care | Payer: Medicare HMO | Source: Ambulatory Visit | Attending: Radiation Oncology | Admitting: Radiation Oncology

## 2020-08-14 DIAGNOSIS — C61 Malignant neoplasm of prostate: Secondary | ICD-10-CM | POA: Diagnosis not present

## 2020-08-14 DIAGNOSIS — Z510 Encounter for antineoplastic radiation therapy: Secondary | ICD-10-CM | POA: Diagnosis not present

## 2020-08-15 ENCOUNTER — Ambulatory Visit
Admission: RE | Admit: 2020-08-15 | Discharge: 2020-08-15 | Disposition: A | Discharge status: Home / Self Care | Payer: Medicare HMO | Source: Ambulatory Visit | Attending: Radiation Oncology | Admitting: Radiation Oncology

## 2020-08-15 DIAGNOSIS — Z510 Encounter for antineoplastic radiation therapy: Secondary | ICD-10-CM | POA: Diagnosis not present

## 2020-08-15 DIAGNOSIS — C61 Malignant neoplasm of prostate: Secondary | ICD-10-CM | POA: Diagnosis not present

## 2020-08-18 ENCOUNTER — Ambulatory Visit
Admission: RE | Admit: 2020-08-18 | Discharge: 2020-08-18 | Disposition: A | Discharge status: Home / Self Care | Payer: Medicare HMO | Source: Ambulatory Visit | Attending: Radiation Oncology | Admitting: Radiation Oncology

## 2020-08-18 DIAGNOSIS — Z510 Encounter for antineoplastic radiation therapy: Secondary | ICD-10-CM | POA: Diagnosis not present

## 2020-08-18 DIAGNOSIS — C61 Malignant neoplasm of prostate: Secondary | ICD-10-CM | POA: Diagnosis not present

## 2020-08-19 ENCOUNTER — Ambulatory Visit
Admission: RE | Admit: 2020-08-19 | Discharge: 2020-08-19 | Disposition: A | Discharge status: Home / Self Care | Payer: Medicare HMO | Source: Ambulatory Visit | Attending: Radiation Oncology | Admitting: Radiation Oncology

## 2020-08-19 DIAGNOSIS — C61 Malignant neoplasm of prostate: Secondary | ICD-10-CM | POA: Diagnosis not present

## 2020-08-19 DIAGNOSIS — Z510 Encounter for antineoplastic radiation therapy: Secondary | ICD-10-CM | POA: Diagnosis not present

## 2020-08-20 ENCOUNTER — Ambulatory Visit
Admission: RE | Admit: 2020-08-20 | Discharge: 2020-08-20 | Disposition: A | Discharge status: Home / Self Care | Payer: Medicare HMO | Source: Ambulatory Visit | Attending: Radiation Oncology | Admitting: Radiation Oncology

## 2020-08-20 DIAGNOSIS — C61 Malignant neoplasm of prostate: Secondary | ICD-10-CM | POA: Diagnosis not present

## 2020-08-20 DIAGNOSIS — Z510 Encounter for antineoplastic radiation therapy: Secondary | ICD-10-CM | POA: Diagnosis not present

## 2020-08-21 ENCOUNTER — Ambulatory Visit
Admission: RE | Admit: 2020-08-21 | Discharge: 2020-08-21 | Disposition: A | Discharge status: Home / Self Care | Payer: Medicare HMO | Source: Ambulatory Visit | Attending: Radiation Oncology | Admitting: Radiation Oncology

## 2020-08-21 DIAGNOSIS — Z510 Encounter for antineoplastic radiation therapy: Secondary | ICD-10-CM | POA: Diagnosis not present

## 2020-08-21 DIAGNOSIS — C61 Malignant neoplasm of prostate: Secondary | ICD-10-CM | POA: Diagnosis not present

## 2020-08-22 ENCOUNTER — Ambulatory Visit
Admission: RE | Admit: 2020-08-22 | Discharge: 2020-08-22 | Disposition: A | Discharge status: Home / Self Care | Payer: Medicare HMO | Source: Ambulatory Visit | Attending: Radiation Oncology | Admitting: Radiation Oncology

## 2020-08-22 ENCOUNTER — Other Ambulatory Visit: Payer: Self-pay | Admitting: Family Medicine

## 2020-08-22 DIAGNOSIS — R69 Illness, unspecified: Secondary | ICD-10-CM | POA: Diagnosis not present

## 2020-08-22 DIAGNOSIS — C61 Malignant neoplasm of prostate: Secondary | ICD-10-CM | POA: Diagnosis not present

## 2020-08-22 DIAGNOSIS — Z510 Encounter for antineoplastic radiation therapy: Secondary | ICD-10-CM | POA: Diagnosis not present

## 2020-08-25 ENCOUNTER — Ambulatory Visit
Admission: RE | Admit: 2020-08-25 | Discharge: 2020-08-25 | Disposition: A | Discharge status: Home / Self Care | Payer: Medicare HMO | Source: Ambulatory Visit | Attending: Radiation Oncology | Admitting: Radiation Oncology

## 2020-08-25 ENCOUNTER — Other Ambulatory Visit: Payer: Self-pay

## 2020-08-25 DIAGNOSIS — Z510 Encounter for antineoplastic radiation therapy: Secondary | ICD-10-CM | POA: Diagnosis not present

## 2020-08-25 DIAGNOSIS — C61 Malignant neoplasm of prostate: Secondary | ICD-10-CM | POA: Diagnosis not present

## 2020-08-26 ENCOUNTER — Ambulatory Visit
Admission: RE | Admit: 2020-08-26 | Discharge: 2020-08-26 | Disposition: A | Discharge status: Home / Self Care | Payer: Medicare HMO | Source: Ambulatory Visit | Attending: Radiation Oncology | Admitting: Radiation Oncology

## 2020-08-26 DIAGNOSIS — C61 Malignant neoplasm of prostate: Secondary | ICD-10-CM | POA: Diagnosis not present

## 2020-08-26 DIAGNOSIS — Z510 Encounter for antineoplastic radiation therapy: Secondary | ICD-10-CM | POA: Diagnosis not present

## 2020-08-27 ENCOUNTER — Ambulatory Visit
Admission: RE | Admit: 2020-08-27 | Discharge: 2020-08-27 | Disposition: A | Discharge status: Home / Self Care | Payer: Medicare HMO | Source: Ambulatory Visit | Attending: Radiation Oncology | Admitting: Radiation Oncology

## 2020-08-27 ENCOUNTER — Other Ambulatory Visit: Payer: Self-pay

## 2020-08-27 ENCOUNTER — Ambulatory Visit (INDEPENDENT_AMBULATORY_CARE_PROVIDER_SITE_OTHER): Payer: Medicare HMO

## 2020-08-27 DIAGNOSIS — C61 Malignant neoplasm of prostate: Secondary | ICD-10-CM

## 2020-08-27 DIAGNOSIS — Z510 Encounter for antineoplastic radiation therapy: Secondary | ICD-10-CM | POA: Diagnosis not present

## 2020-08-27 MED ORDER — LEUPROLIDE ACETATE (4 MONTH) 30 MG ~~LOC~~ KIT
30.0000 mg | PACK | Freq: Once | SUBCUTANEOUS | Status: AC
  Administered 2020-08-27: 30 mg via SUBCUTANEOUS

## 2020-09-01 ENCOUNTER — Ambulatory Visit
Admission: RE | Admit: 2020-09-01 | Discharge: 2020-09-01 | Disposition: A | Discharge status: Home / Self Care | Payer: Medicare HMO | Source: Ambulatory Visit | Attending: Radiation Oncology | Admitting: Radiation Oncology

## 2020-09-01 DIAGNOSIS — C61 Malignant neoplasm of prostate: Secondary | ICD-10-CM | POA: Diagnosis not present

## 2020-09-01 DIAGNOSIS — Z510 Encounter for antineoplastic radiation therapy: Secondary | ICD-10-CM | POA: Diagnosis not present

## 2020-09-02 ENCOUNTER — Ambulatory Visit
Admission: RE | Admit: 2020-09-02 | Discharge: 2020-09-02 | Disposition: A | Discharge status: Home / Self Care | Payer: Medicare HMO | Source: Ambulatory Visit | Attending: Radiation Oncology | Admitting: Radiation Oncology

## 2020-09-02 DIAGNOSIS — C61 Malignant neoplasm of prostate: Secondary | ICD-10-CM | POA: Diagnosis not present

## 2020-09-02 DIAGNOSIS — Z510 Encounter for antineoplastic radiation therapy: Secondary | ICD-10-CM | POA: Diagnosis not present

## 2020-09-03 ENCOUNTER — Ambulatory Visit
Admission: RE | Admit: 2020-09-03 | Discharge: 2020-09-03 | Disposition: A | Discharge status: Home / Self Care | Payer: Medicare HMO | Source: Ambulatory Visit | Attending: Radiation Oncology | Admitting: Radiation Oncology

## 2020-09-03 ENCOUNTER — Other Ambulatory Visit: Payer: Self-pay

## 2020-09-03 ENCOUNTER — Other Ambulatory Visit: Payer: Medicare HMO

## 2020-09-03 DIAGNOSIS — C61 Malignant neoplasm of prostate: Secondary | ICD-10-CM | POA: Diagnosis not present

## 2020-09-03 DIAGNOSIS — Z510 Encounter for antineoplastic radiation therapy: Secondary | ICD-10-CM | POA: Diagnosis not present

## 2020-09-04 ENCOUNTER — Ambulatory Visit
Admission: RE | Admit: 2020-09-04 | Discharge: 2020-09-04 | Disposition: A | Discharge status: Home / Self Care | Payer: Medicare HMO | Source: Ambulatory Visit | Attending: Radiation Oncology | Admitting: Radiation Oncology

## 2020-09-04 DIAGNOSIS — C61 Malignant neoplasm of prostate: Secondary | ICD-10-CM | POA: Diagnosis not present

## 2020-09-04 DIAGNOSIS — Z510 Encounter for antineoplastic radiation therapy: Secondary | ICD-10-CM | POA: Diagnosis not present

## 2020-09-04 LAB — TESTOSTERONE: Testosterone: 12 ng/dL — ABNORMAL LOW (ref 264–916)

## 2020-09-04 LAB — PSA: Prostate Specific Ag, Serum: <0.1 ng/mL (ref 0.0–4.0)

## 2020-09-05 ENCOUNTER — Ambulatory Visit
Admission: RE | Admit: 2020-09-05 | Discharge: 2020-09-05 | Disposition: A | Discharge status: Home / Self Care | Payer: Medicare HMO | Source: Ambulatory Visit | Attending: Radiation Oncology | Admitting: Radiation Oncology

## 2020-09-05 DIAGNOSIS — C61 Malignant neoplasm of prostate: Secondary | ICD-10-CM | POA: Diagnosis not present

## 2020-09-05 DIAGNOSIS — Z510 Encounter for antineoplastic radiation therapy: Secondary | ICD-10-CM | POA: Diagnosis not present

## 2020-09-08 ENCOUNTER — Ambulatory Visit
Admission: RE | Admit: 2020-09-08 | Discharge: 2020-09-08 | Disposition: A | Discharge status: Home / Self Care | Payer: Medicare HMO | Source: Ambulatory Visit | Attending: Radiation Oncology | Admitting: Radiation Oncology

## 2020-09-08 ENCOUNTER — Telehealth: Payer: Self-pay | Admitting: Urology

## 2020-09-08 DIAGNOSIS — Z510 Encounter for antineoplastic radiation therapy: Secondary | ICD-10-CM | POA: Diagnosis not present

## 2020-09-08 DIAGNOSIS — C61 Malignant neoplasm of prostate: Secondary | ICD-10-CM | POA: Diagnosis not present

## 2020-09-08 NOTE — Telephone Encounter (Signed)
-----   Message from Cleon Gustin, MD sent at 09/08/2020 10:18 AM EST ----- normal ----- Message ----- From: Valentina Lucks, LPN Sent: 99/05/11   8:37 AM EST To: Cleon Gustin, MD  Pls review

## 2020-09-08 NOTE — Telephone Encounter (Signed)
Pt wanted to know the results of his PSA test.

## 2020-09-08 NOTE — Telephone Encounter (Signed)
Attempted to call pt

## 2020-09-09 ENCOUNTER — Ambulatory Visit
Admission: RE | Admit: 2020-09-09 | Discharge: 2020-09-09 | Disposition: A | Discharge status: Home / Self Care | Payer: Medicare HMO | Source: Ambulatory Visit | Attending: Radiation Oncology | Admitting: Radiation Oncology

## 2020-09-09 DIAGNOSIS — M6281 Muscle weakness (generalized): Secondary | ICD-10-CM | POA: Diagnosis not present

## 2020-09-09 DIAGNOSIS — C61 Malignant neoplasm of prostate: Secondary | ICD-10-CM | POA: Diagnosis not present

## 2020-09-09 DIAGNOSIS — M62838 Other muscle spasm: Secondary | ICD-10-CM | POA: Diagnosis not present

## 2020-09-09 DIAGNOSIS — Z510 Encounter for antineoplastic radiation therapy: Secondary | ICD-10-CM | POA: Diagnosis not present

## 2020-09-09 DIAGNOSIS — N393 Stress incontinence (female) (male): Secondary | ICD-10-CM | POA: Diagnosis not present

## 2020-09-09 NOTE — Telephone Encounter (Signed)
Pt notified of normal PSA and Testosterone.

## 2020-09-09 NOTE — Telephone Encounter (Signed)
-----   Message from Cleon Gustin, MD sent at 09/08/2020 10:18 AM EST ----- normal ----- Message ----- From: Valentina Lucks, LPN Sent: 15/10/7614   8:37 AM EST To: Cleon Gustin, MD  Pls review

## 2020-09-09 NOTE — Telephone Encounter (Signed)
Attempted to call pt again. 

## 2020-09-10 ENCOUNTER — Ambulatory Visit
Admission: RE | Admit: 2020-09-10 | Discharge: 2020-09-10 | Disposition: A | Discharge status: Home / Self Care | Payer: Medicare HMO | Source: Ambulatory Visit | Attending: Radiation Oncology | Admitting: Radiation Oncology

## 2020-09-10 ENCOUNTER — Other Ambulatory Visit: Payer: Self-pay

## 2020-09-10 ENCOUNTER — Ambulatory Visit: Payer: Medicare HMO | Admitting: Urology

## 2020-09-10 DIAGNOSIS — C61 Malignant neoplasm of prostate: Secondary | ICD-10-CM | POA: Diagnosis not present

## 2020-09-10 DIAGNOSIS — Z510 Encounter for antineoplastic radiation therapy: Secondary | ICD-10-CM | POA: Diagnosis not present

## 2020-09-11 ENCOUNTER — Ambulatory Visit
Admission: RE | Admit: 2020-09-11 | Discharge: 2020-09-11 | Disposition: A | Discharge status: Home / Self Care | Payer: Medicare HMO | Source: Ambulatory Visit | Attending: Radiation Oncology | Admitting: Radiation Oncology

## 2020-09-11 DIAGNOSIS — Z510 Encounter for antineoplastic radiation therapy: Secondary | ICD-10-CM | POA: Diagnosis not present

## 2020-09-11 DIAGNOSIS — C61 Malignant neoplasm of prostate: Secondary | ICD-10-CM | POA: Diagnosis not present

## 2020-09-12 ENCOUNTER — Ambulatory Visit
Admission: RE | Admit: 2020-09-12 | Discharge: 2020-09-12 | Disposition: A | Discharge status: Home / Self Care | Payer: Medicare HMO | Source: Ambulatory Visit | Attending: Radiation Oncology | Admitting: Radiation Oncology

## 2020-09-12 ENCOUNTER — Ambulatory Visit: Payer: Medicare HMO | Admitting: Radiation Oncology

## 2020-09-12 DIAGNOSIS — Z510 Encounter for antineoplastic radiation therapy: Secondary | ICD-10-CM | POA: Diagnosis not present

## 2020-09-12 DIAGNOSIS — C61 Malignant neoplasm of prostate: Secondary | ICD-10-CM | POA: Diagnosis not present

## 2020-09-15 ENCOUNTER — Ambulatory Visit
Admission: RE | Admit: 2020-09-15 | Discharge: 2020-09-15 | Disposition: A | Discharge status: Home / Self Care | Payer: Medicare HMO | Source: Ambulatory Visit | Attending: Radiation Oncology | Admitting: Radiation Oncology

## 2020-09-15 DIAGNOSIS — Z510 Encounter for antineoplastic radiation therapy: Secondary | ICD-10-CM | POA: Diagnosis not present

## 2020-09-15 DIAGNOSIS — C61 Malignant neoplasm of prostate: Secondary | ICD-10-CM | POA: Diagnosis not present

## 2020-09-16 ENCOUNTER — Ambulatory Visit
Admission: RE | Admit: 2020-09-16 | Discharge: 2020-09-16 | Disposition: A | Discharge status: Home / Self Care | Payer: Medicare HMO | Source: Ambulatory Visit | Attending: Radiation Oncology | Admitting: Radiation Oncology

## 2020-09-16 DIAGNOSIS — Z510 Encounter for antineoplastic radiation therapy: Secondary | ICD-10-CM | POA: Diagnosis not present

## 2020-09-16 DIAGNOSIS — C61 Malignant neoplasm of prostate: Secondary | ICD-10-CM | POA: Diagnosis not present

## 2020-09-17 ENCOUNTER — Ambulatory Visit
Admission: RE | Admit: 2020-09-17 | Discharge: 2020-09-17 | Disposition: A | Discharge status: Home / Self Care | Payer: Medicare HMO | Source: Ambulatory Visit | Attending: Radiation Oncology | Admitting: Radiation Oncology

## 2020-09-17 ENCOUNTER — Ambulatory Visit: Payer: Medicare HMO | Admitting: Family Medicine

## 2020-09-17 DIAGNOSIS — Z510 Encounter for antineoplastic radiation therapy: Secondary | ICD-10-CM | POA: Diagnosis not present

## 2020-09-17 DIAGNOSIS — C61 Malignant neoplasm of prostate: Secondary | ICD-10-CM | POA: Diagnosis not present

## 2020-09-18 ENCOUNTER — Ambulatory Visit
Admission: RE | Admit: 2020-09-18 | Discharge: 2020-09-18 | Disposition: A | Discharge status: Home / Self Care | Payer: Medicare HMO | Source: Ambulatory Visit | Attending: Radiation Oncology | Admitting: Radiation Oncology

## 2020-09-18 ENCOUNTER — Other Ambulatory Visit: Payer: Self-pay

## 2020-09-18 DIAGNOSIS — Z510 Encounter for antineoplastic radiation therapy: Secondary | ICD-10-CM | POA: Diagnosis not present

## 2020-09-18 DIAGNOSIS — C61 Malignant neoplasm of prostate: Secondary | ICD-10-CM | POA: Diagnosis not present

## 2020-09-19 ENCOUNTER — Ambulatory Visit (INDEPENDENT_AMBULATORY_CARE_PROVIDER_SITE_OTHER): Payer: Medicare HMO | Admitting: Family Medicine

## 2020-09-19 ENCOUNTER — Ambulatory Visit
Admission: RE | Admit: 2020-09-19 | Discharge: 2020-09-19 | Disposition: A | Discharge status: Home / Self Care | Payer: Medicare HMO | Source: Ambulatory Visit | Attending: Radiation Oncology | Admitting: Radiation Oncology

## 2020-09-19 ENCOUNTER — Encounter: Payer: Self-pay | Admitting: Family Medicine

## 2020-09-19 VITALS — BP 114/72 | HR 106 | Temp 98.2°F | Ht 72.0 in | Wt 274.0 lb

## 2020-09-19 DIAGNOSIS — Z79899 Other long term (current) drug therapy: Secondary | ICD-10-CM | POA: Diagnosis not present

## 2020-09-19 DIAGNOSIS — E119 Type 2 diabetes mellitus without complications: Secondary | ICD-10-CM

## 2020-09-19 DIAGNOSIS — I10 Essential (primary) hypertension: Secondary | ICD-10-CM

## 2020-09-19 DIAGNOSIS — E7849 Other hyperlipidemia: Secondary | ICD-10-CM | POA: Diagnosis not present

## 2020-09-19 DIAGNOSIS — Z510 Encounter for antineoplastic radiation therapy: Secondary | ICD-10-CM | POA: Diagnosis not present

## 2020-09-19 DIAGNOSIS — C61 Malignant neoplasm of prostate: Secondary | ICD-10-CM | POA: Diagnosis not present

## 2020-09-19 NOTE — Patient Instructions (Signed)
  This is some additional information regarding Covid vaccine.  The Covid vaccine-specifically Pfizer and Moderna-help program the body to help recognize Covid to lessen the chance of getting sick with Covid plus also dramatically lessen the risk of a severe outcome.  These vaccines require 2 shots spaced several weeks apart.  The vaccine utilizes mRNA technology and does not interact with our DNA.  The research for these vaccines started back in the 1990s with mRNA research and technology.  The use of mRNA technology for vaccines was actually developed back during SARS infection of [place] in 2005.  Because this virus did not go worldwide there was never a need to utilize a vaccine.  The vaccine simply instructs our immune system how to recognize Covid virus thus lessening our risk of severe illness.  The studies show that vaccines are generally very well-tolerated.  It is common to have some mild side effects such as soreness at the site of the injection, for some individuals low-grade fever body aches headaches nausea diarrhea not feeling good for 2 to 3 days after the injection.  These moderate side effects are more likely to occur with the second shot.  Within 2 weeks of completing the second shot antibody levels are at a good level. Study showed that since June 2020 90% of those hospitalized for Covid were unvaccinated.  96% of those who died of Covid were unvaccinated.  The risk of dying from Covid for those who are vaccinated and under age 50 approaches 0%.  Most people who get sick with Covid will gradually get better but there is a randomness to the illness in which even younger individuals can have a severe case and end up in the hospital with Covid.  It is true that the older we are, Covid poses more risk.  Unfortunately some of the individuals who have severe cases or die from Covid have very little underlying health issues and are not considered "elderly ".1 in 7 individuals who have  Covid continue to have some symptoms of long-term Covid.  Sometimes this can be altered smell.  There are some this can result with long-term fatigue tiredness or headaches.  Also it has been shown.  Scientific studies that Covid can trigger heart attacks and strokes.  Having a Covid vaccine will not protect one against all Covid infections.  Approximately 20 to 30% of individuals who have had vaccines may get a amount of breakthrough case.  The way I look upon this-most of us drive cars that have seatbelts and in some cases airbags.  Every day we drive, we take a risk of potentially being in the traffic accident.  Having a seatbelt and airbag does not mean that we will not get in a traffic accident.  But having a seatbelt and the airbag can dramatically lower our risk of a severe outcome that could put us in the hospital or cause us to die from a auto accident.  In the same way having the Covid vaccine will lessen our risk of getting sick with Covid but if we are unlucky enough to get Covid the chance of being hospitalized or dying from it is tremendously lower.  So just as I would not feel good about disengaging my seatbelt and disabling my airbag-I would not feel good about living in a pandemic and not having the vaccine.  It is not unusual for viruses to mutate and therefore it is not unusual to have to sometimes redesign vaccines or get   a booster in order to maintain effectiveness of the vaccine.  I have had several individuals who have stated that they are not around others and so therefore they do not need to get the vaccine.  The difficult part is-this virus can infect some without triggering symptoms-therefore we can get exposed by being around a person who looks healthy-yet they are spreading the virus.  Every person is at risk of getting Covid.  I certainly recognize that many patients have difficulty deciding whether or not to do the vaccine.  Unfortunately there is a lot of information being  pushed forward on the news media, social media such as Facebook, and discussion of all friends and family that can be confusing for the average person to figure out.  It is important to realize that this information that we are exposed to falls into 3 categories. Category #1-on target information-this is typically information that is shown by scientific studies, or stated forth by experts within their fields. Category #2-misinformation-this is information that is well intended but is not an accurate reflection of what is really the case. Category #3-dis-information-this is information that is purposefully off base.  It is designed to confuse and discourage an individual from making a proper decision.  Through the past year and a half I have heard many different reasons why people do not get Covid vaccines.  Some of these reasons are a reflection of a person's deeply held beliefs.  Other reasons are based upon misinformation propagated in social media.  When making this decision it is extremely important to weed through all the noise!   Through my years of training, it has helped me to look at public health issues and treatment options from a medical perspective.  It is my firm belief that this vaccine is safe (I personally favor Moderna but Pfizer vaccine is also good).  Please do not underestimate this virus.  It is my sincere wish that all of our patients stay healthy and do not suffer tragic outcomes from this virus.  Please consider getting the vaccine if you have not already done so.  If you should have further questions please let us know.  Thanks-Dr. Damare Serano  

## 2020-09-19 NOTE — Progress Notes (Signed)
   Subjective:    Patient ID: [name]., male    DOB: 1955/10/24, 64 y.o.   MRN: [id number]  Diabetes He presents for his follow-up diabetic visit. He has type 2 diabetes mellitus. Pertinent negatives for hypoglycemia include no confusion, dizziness or headaches. Pertinent negatives for diabetes include no chest pain and no fatigue. Current diabetic treatments: metformin. Home blood sugar record trend: around 125.   Patient here for follow-up regarding cholesterol.  The patient does have hyperlipidemia.  Patient does try to maintain a reasonable diet.  Patient does take the medication on a regular basis.  Denies missing a dose.  The patient denies any obvious side effects.  Prior blood work results reviewed with the patient.  The patient is aware of his cholesterol goals and the need to keep it under good control to lessen the risk of disease.  Patient for blood pressure check up.  The patient does have hypertension.  The patient is on medication.  Patient relates compliance with meds. Todays BP reviewed with the patient. Patient denies issues with medication. Patient relates reasonable diet. Patient tries to minimize salt. Patient aware of BP goals.  Moderate obesity patient is watching portions trying to be more active  Patient has not done Covid vaccine.  I have encouraged him strongly to get this completed to lessen his risk.    Review of Systems  Constitutional: Negative for diaphoresis and fatigue.  HENT: Negative for congestion and rhinorrhea.   Respiratory: Negative for cough and shortness of breath.   Cardiovascular: Negative for chest pain and leg swelling.  Gastrointestinal: Negative for abdominal pain and diarrhea.  Skin: Negative for color change and rash.  Neurological: Negative for dizziness and headaches.  Psychiatric/Behavioral: Negative for behavioral problems and confusion.       Objective:   Physical Exam  Lungs clear respiratory rate normal heart regular no  murmurs pulse normal extremities no edema skin warm dry      Assessment & Plan:  1. Type II diabetes mellitus, well controlled ([place]) The patient was seen today as part of a comprehensive visit for diabetes. The importance of keeping her A1c at or below 7 was discussed.  Discussed diet, activity, and medication compliance Standard follow-up visit recommended.  Patient aware lack of control and follow-up increases risk of diabetic complications. Work hard at [employer] regular physical activity await labs - Hemoglobin A1c  2. Other hyperlipidemia Continue cholesterol medicine the lower risk of heart disease watch diet - Lipid panel  3. Hypertension, unspecified type Continue blood pressure medicine lower the risk of heart disease and stroke continue diet - Basic metabolic panel  4. High risk medication use Liver functions recommended - Hepatic function panel  5. Morbid obesity ([place]) Associated with diabetes and hypertension healthy diet portion control regular activity recommended  Prostate cancer followed by urology  Covid vaccine strongly encouraged to lessen the risk of severe complex complications or death with infection

## 2020-09-22 ENCOUNTER — Ambulatory Visit
Admission: RE | Admit: 2020-09-22 | Discharge: 2020-09-22 | Disposition: A | Discharge status: Home / Self Care | Payer: Medicare HMO | Source: Ambulatory Visit | Attending: Radiation Oncology | Admitting: Radiation Oncology

## 2020-09-22 DIAGNOSIS — C61 Malignant neoplasm of prostate: Secondary | ICD-10-CM | POA: Diagnosis not present

## 2020-09-22 DIAGNOSIS — E119 Type 2 diabetes mellitus without complications: Secondary | ICD-10-CM | POA: Diagnosis not present

## 2020-09-22 DIAGNOSIS — I10 Essential (primary) hypertension: Secondary | ICD-10-CM | POA: Diagnosis not present

## 2020-09-22 DIAGNOSIS — Z510 Encounter for antineoplastic radiation therapy: Secondary | ICD-10-CM | POA: Diagnosis not present

## 2020-09-22 DIAGNOSIS — Z79899 Other long term (current) drug therapy: Secondary | ICD-10-CM | POA: Diagnosis not present

## 2020-09-22 DIAGNOSIS — E7849 Other hyperlipidemia: Secondary | ICD-10-CM | POA: Diagnosis not present

## 2020-09-23 ENCOUNTER — Ambulatory Visit
Admission: RE | Admit: 2020-09-23 | Discharge: 2020-09-23 | Disposition: A | Discharge status: Home / Self Care | Payer: Medicare HMO | Source: Ambulatory Visit | Attending: Radiation Oncology | Admitting: Radiation Oncology

## 2020-09-23 DIAGNOSIS — Z510 Encounter for antineoplastic radiation therapy: Secondary | ICD-10-CM | POA: Diagnosis not present

## 2020-09-23 DIAGNOSIS — C61 Malignant neoplasm of prostate: Secondary | ICD-10-CM | POA: Diagnosis not present

## 2020-09-23 LAB — LIPID PANEL
Chol/HDL Ratio: 2.3 ratio (ref 0.0–5.0)
Cholesterol, Total: 152 mg/dL (ref 100–199)
HDL: 67 mg/dL (ref >39)
LDL Chol Calc (NIH): 63 mg/dL (ref 0–99)
Triglycerides: 129 mg/dL (ref 0–149)
VLDL Cholesterol Cal: 22 mg/dL (ref 5–40)

## 2020-09-23 LAB — HEPATIC FUNCTION PANEL
ALT: 19 IU/L (ref 0–44)
AST: 22 IU/L (ref 0–40)
Albumin: 4.2 g/dL (ref 3.8–4.8)
Alkaline Phosphatase: 57 IU/L (ref 44–121)
Bilirubin Total: 0.5 mg/dL (ref 0.0–1.2)
Bilirubin, Direct: 0.15 mg/dL (ref 0.00–0.40)
Total Protein: 6.6 g/dL (ref 6.0–8.5)

## 2020-09-23 LAB — BASIC METABOLIC PANEL
BUN/Creatinine Ratio: 16 (ref 10–24)
BUN: 23 mg/dL (ref 8–27)
CO2: 24 mmol/L (ref 20–29)
Calcium: 9.5 mg/dL (ref 8.6–10.2)
Chloride: 102 mmol/L (ref 96–106)
Creatinine, Ser: 1.44 mg/dL — ABNORMAL HIGH (ref 0.76–1.27)
GFR calc Af Amer: 58 mL/min/{1.73_m2} — ABNORMAL LOW (ref >59)
GFR calc non Af Amer: 50 mL/min/{1.73_m2} — ABNORMAL LOW (ref >59)
Glucose: 150 mg/dL — ABNORMAL HIGH (ref 65–99)
Potassium: 4.5 mmol/L (ref 3.5–5.2)
Sodium: 139 mmol/L (ref 134–144)

## 2020-09-23 LAB — HEMOGLOBIN A1C
Est. average glucose Bld gHb Est-mCnc: 151 mg/dL
Hgb A1c MFr Bld: 6.9 % — ABNORMAL HIGH (ref 4.8–5.6)

## 2020-09-24 ENCOUNTER — Ambulatory Visit
Admission: RE | Admit: 2020-09-24 | Discharge: 2020-09-24 | Disposition: A | Discharge status: Home / Self Care | Payer: Medicare HMO | Source: Ambulatory Visit | Attending: Radiation Oncology | Admitting: Radiation Oncology

## 2020-09-24 DIAGNOSIS — Z510 Encounter for antineoplastic radiation therapy: Secondary | ICD-10-CM | POA: Diagnosis not present

## 2020-09-24 DIAGNOSIS — C61 Malignant neoplasm of prostate: Secondary | ICD-10-CM | POA: Diagnosis not present

## 2020-09-25 ENCOUNTER — Encounter: Payer: Self-pay | Admitting: Family Medicine

## 2020-09-25 ENCOUNTER — Other Ambulatory Visit: Payer: Self-pay | Admitting: *Deleted

## 2020-09-25 ENCOUNTER — Ambulatory Visit
Admission: RE | Admit: 2020-09-25 | Discharge: 2020-09-25 | Disposition: A | Discharge status: Home / Self Care | Payer: Medicare HMO | Source: Ambulatory Visit | Attending: Radiation Oncology | Admitting: Radiation Oncology

## 2020-09-25 DIAGNOSIS — C61 Malignant neoplasm of prostate: Secondary | ICD-10-CM | POA: Diagnosis not present

## 2020-09-25 DIAGNOSIS — Z510 Encounter for antineoplastic radiation therapy: Secondary | ICD-10-CM | POA: Diagnosis not present

## 2020-09-25 DIAGNOSIS — E119 Type 2 diabetes mellitus without complications: Secondary | ICD-10-CM

## 2020-09-25 DIAGNOSIS — Z79899 Other long term (current) drug therapy: Secondary | ICD-10-CM

## 2020-09-29 ENCOUNTER — Ambulatory Visit
Admission: RE | Admit: 2020-09-29 | Discharge: 2020-09-29 | Disposition: A | Discharge status: Home / Self Care | Payer: Medicare HMO | Source: Ambulatory Visit | Attending: Radiation Oncology | Admitting: Radiation Oncology

## 2020-09-29 DIAGNOSIS — C61 Malignant neoplasm of prostate: Secondary | ICD-10-CM | POA: Diagnosis not present

## 2020-09-29 DIAGNOSIS — Z510 Encounter for antineoplastic radiation therapy: Secondary | ICD-10-CM | POA: Diagnosis not present

## 2020-09-30 ENCOUNTER — Encounter: Payer: Self-pay | Admitting: Medical Oncology

## 2020-09-30 ENCOUNTER — Encounter: Payer: Self-pay | Admitting: Radiation Oncology

## 2020-09-30 ENCOUNTER — Ambulatory Visit
Admission: RE | Admit: 2020-09-30 | Discharge: 2020-09-30 | Disposition: A | Discharge status: Home / Self Care | Payer: Medicare HMO | Source: Ambulatory Visit | Attending: Radiation Oncology | Admitting: Radiation Oncology

## 2020-09-30 DIAGNOSIS — Z510 Encounter for antineoplastic radiation therapy: Secondary | ICD-10-CM | POA: Diagnosis not present

## 2020-09-30 DIAGNOSIS — C61 Malignant neoplasm of prostate: Secondary | ICD-10-CM | POA: Diagnosis not present

## 2020-10-03 ENCOUNTER — Other Ambulatory Visit: Payer: Self-pay | Admitting: Family Medicine

## 2020-10-14 ENCOUNTER — Other Ambulatory Visit: Payer: Self-pay

## 2020-10-14 ENCOUNTER — Encounter: Payer: Self-pay | Admitting: Urology

## 2020-10-14 ENCOUNTER — Ambulatory Visit (INDEPENDENT_AMBULATORY_CARE_PROVIDER_SITE_OTHER): Payer: Medicare HMO | Admitting: Urology

## 2020-10-14 VITALS — BP 107/73 | HR 92 | Temp 97.6°F | Wt 270.0 lb

## 2020-10-14 DIAGNOSIS — N3941 Urge incontinence: Secondary | ICD-10-CM

## 2020-10-14 DIAGNOSIS — C61 Malignant neoplasm of prostate: Secondary | ICD-10-CM | POA: Diagnosis not present

## 2020-10-14 LAB — URINALYSIS, ROUTINE W REFLEX MICROSCOPIC
Bilirubin, UA: NEGATIVE
Glucose, UA: NEGATIVE
Ketones, UA: NEGATIVE
Leukocytes,UA: NEGATIVE
Nitrite, UA: NEGATIVE
Protein,UA: NEGATIVE
RBC, UA: NEGATIVE
Specific Gravity, UA: 1.025 (ref 1.005–1.030)
Urobilinogen, Ur: 0.2 mg/dL (ref 0.2–1.0)
pH, UA: 5 (ref 5.0–7.5)

## 2020-10-14 MED ORDER — MIRABEGRON ER 25 MG PO TB24: 25.0000 mg | ORAL_TABLET | Freq: Every day | ORAL | 0 refills | Status: DC

## 2020-10-14 NOTE — Patient Instructions (Signed)
Prostate Cancer  The prostate is a male gland that helps make semen. It is located below a man's bladder, in front of the rectum. Prostate cancer is when abnormal cells grow in this gland. What are the causes? The cause of this condition is not known. What increases the risk? You are more likely to develop this condition if:  You are 67 years of age or older.  You are African American.  You have a family history of prostate cancer.  You have a family history of breast cancer. What are the signs or symptoms? Symptoms of this condition include:  A need to pee often.  Peeing that is weak, or pee that stops and starts.  Trouble starting or stopping your pee.  Inability to pee.  Blood in your pee or semen.  Pain in the lower back, lower belly (abdomen), hips, or upper thighs.  Trouble getting an erection.  Trouble emptying all of your pee. How is this treated? Treatment for this condition depends on your age, your health, the kind of treatment you like, and how far the cancer has spread. Treatments include:  Being watched. This is called observation. You will be tested from time to time, but you will not get treated. Tests are to make sure that the cancer is not growing.  Surgery. This may be done to remove the prostate, to remove the testicles, or to freeze or kill cancer cells.  Radiation. This uses a strong beam to kill cancer cells.  Ultrasound energy. This uses strong sound waves to kill cancer cells.  Chemotherapy. This uses medicines that stop cancer cells from increasing. This kills cancer cells and healthy cells.  Targeted therapy. This kills cancer cells only. Healthy cells are not affected.  Hormone treatment. This stops the body from making hormones that help the cancer cells to grow. Follow these instructions at home:  Take over-the-counter and prescription medicines only as told by your doctor.  Eat a healthy diet.  Get plenty of sleep.  Ask your  doctor for help to find a support group for men with prostate cancer.  If you have to go to the hospital, let your cancer doctor (oncologist) know.  Treatment may affect your ability to have sex. Touch, hold, hug, and caress your partner to have intimate moments.  Keep all follow-up visits as told by your doctor. This is important. Contact a doctor if:  You have new or more trouble peeing.  You have new or more blood in your pee.  You have new or more pain in your hips, back, or chest. Get help right away if:  You have weakness in your legs.  You lose feeling in your legs.  You cannot control your pee or your poop (stool).  You have chills or a fever. Summary  The prostate is a male gland that helps make semen.  Prostate cancer is when abnormal cells grow in this gland.  Treatment includes doing surgery, using medicines, using very strong beams, or watching without treatment.  Ask your doctor for help to find a support group for men with prostate cancer.  Contact a doctor if you have problems peeing or have any new pain that you did not have before. This information is not intended to replace advice given to you by your health care provider. Make sure you discuss any questions you have with your health care provider. Document Revised: 09/04/2019 Document Reviewed: 09/04/2019 Elsevier Patient Education  2021 Elsevier Inc.  

## 2020-10-14 NOTE — Progress Notes (Signed)
Urological Symptom Review ° °Patient is experiencing the following symptoms: °Frequent urination °Get up at night to urinate ° ° °Review of Systems ° °Gastrointestinal (upper)  : °Negative for upper GI symptoms ° °Gastrointestinal (lower) : °Negative for lower GI symptoms ° °Constitutional : °Negative for symptoms ° °Skin: °Negative for skin symptoms ° °Eyes: °Blurred vision ° °Ear/Nose/Throat : °Negative for Ear/Nose/Throat symptoms ° °Hematologic/Lymphatic: °Negative for Hematologic/Lymphatic symptoms ° °Cardiovascular : °Negative for cardiovascular symptoms ° °Respiratory : °Negative for respiratory symptoms ° °Endocrine: °Negative for endocrine symptoms ° °Musculoskeletal: °Negative for musculoskeletal symptoms ° °Neurological: °Negative for neurological symptoms ° °Psychologic: °Negative for psychiatric symptoms °

## 2020-10-14 NOTE — Progress Notes (Signed)
10/14/2020 2:53 PM   [place]. 1956-07-18 [id number]  Referring provider: Kathyrn Drown, MD [address] [place],  [place] [address]  followup prostate cancer  HPI: [name] is a 65yo her for followup for prostate cancer. He completed IMRT 09/30/2020. PSA was undetectable in Dec 2021. Since completing radiation therapy he has noted worsening urinary urgency, frequency, urge incontinence. He pad usage has increased to 3-4 pads per day. He has mild SUI. No other complaints   PMH: Past Medical History:  Diagnosis Date  . Anginal pain ([place]) 2019  . Diabetes mellitus without complication ([place])   . Dysrhythmia    stress related. fast HR  . History of stress test 2014   intermediate risk  . Hyperlipemia   . Hypertension   . Prostate cancer [facility])     Surgical History: Past Surgical History:  Procedure Laterality Date  . COLONOSCOPY N/A 01/03/2020   Procedure: COLONOSCOPY;  Surgeon: Rogene Houston, MD;  Location: [facility];  Service: Endoscopy;  Laterality: N/A;  1055  . HERNIA REPAIR Left    2000, Dr. Aviva Signs  . LYMPHADENECTOMY Bilateral 01/23/2020   Procedure: LYMPHADENECTOMY;  Surgeon: Alexis Frock, MD;  Location: [facility] ORS;  Service: Urology;  Laterality: Bilateral;  . POLYPECTOMY  01/03/2020   Procedure: POLYPECTOMY;  Surgeon: Rogene Houston, MD;  Location: [facility];  Service: Endoscopy;;  . ROBOT ASSISTED LAPAROSCOPIC RADICAL PROSTATECTOMY N/A 01/23/2020   Procedure: XI ROBOTIC ASSISTED LAPAROSCOPIC RADICAL PROSTATECTOMY;  Surgeon: Alexis Frock, MD;  Location: [facility];  Service: Urology;  Laterality: N/A;  3 HRS    Home Medications:  Allergies as of 10/14/2020   No Known Allergies     Medication List       Accurate as of October 14, 2020  2:53 PM. If you have any questions, ask your nurse or doctor.        atorvastatin 10 MG tablet Commonly known as: LIPITOR TAKE 1 TABLET BY MOUTH EVERY DAY   blood glucose meter kit and  supplies Dispense based on patient and insurance preference. Use to check sugars once a day (FOR ICD-10 E10.9, E11.9).   HYDROcodone-acetaminophen 5-325 MG tablet Commonly known as: Norco Take 1-2 tablets by mouth every 6 (six) hours as needed for moderate pain.   lisinopril 10 MG tablet Commonly known as: ZESTRIL Take 1 tablet (10 mg total) by mouth daily.   metFORMIN 500 MG tablet Commonly known as: GLUCOPHAGE Take 2 tablets (1,000 mg total) by mouth 2 (two) times daily.   nitroGLYCERIN 0.4 MG SL tablet Commonly known as: NITROSTAT Place 1 tablet (0.4 mg total) under the tongue every 5 (five) minutes as needed for chest pain.   OneTouch Delica Plus [id number] Misc USE TO OBTAIN A BLOOD SPECIMEN TO TEST BLOOD SUGAR DAILY   OneTouch Ultra test strip Generic drug: glucose blood USE TO TEST BLOOD SUGAR DAILY       Allergies: No Known Allergies  Family History: Family History  Problem Relation Age of Onset  . Diabetes Father   . Heart disease Mother   . Cancer Other   . Breast cancer Neg Hx   . Prostate cancer Neg Hx   . Colon cancer Neg Hx   . Pancreatic cancer Neg Hx     Social History:  reports that he has never smoked. He has never used smokeless tobacco. He reports that he does not drink alcohol and does not use drugs.  ROS: All  other review of systems were reviewed and are negative except what is noted above in HPI  Physical Exam: BP 107/73   Pulse 92   Temp 97.6 F (36.4 C)   Wt 270 lb (122.5 kg)   BMI 36.62 kg/m   Constitutional:  Alert and oriented, No acute distress. HEENT: [place] AT, moist mucus membranes.  Trachea midline, no masses. Cardiovascular: No clubbing, cyanosis, or edema. Respiratory: Normal respiratory effort, no increased work of breathing. GI: Abdomen is soft, nontender, nondistended, no abdominal masses GU: No CVA tenderness.  Lymph: No cervical or inguinal lymphadenopathy. Skin: No rashes, bruises or suspicious lesions. Neurologic:  Grossly intact, no focal deficits, moving all 4 extremities. Psychiatric: Normal mood and affect.  Laboratory Data: Lab Results  Component Value Date   WBC 7.1 01/15/2020   HGB 13.9 01/24/2020   HCT 43.1 01/24/2020   MCV 92.1 01/15/2020   PLT 208 01/15/2020    Lab Results  Component Value Date   CREATININE 1.44 (H) 09/22/2020    Lab Results  Component Value Date   PSA <0.1 03/04/2020   PSA 2.3 12/06/2014    Lab Results  Component Value Date   TESTOSTERONE 12 (L) 09/03/2020    Lab Results  Component Value Date   HGBA1C 6.9 (H) 09/22/2020    Urinalysis    Component Value Date/Time   APPEARANCEUR Clear 10/14/2020 1417   GLUCOSEU Negative 10/14/2020 1417   BILIRUBINUR Negative 10/14/2020 1417   PROTEINUR Negative 10/14/2020 1417   NITRITE Negative 10/14/2020 1417   LEUKOCYTESUR Negative 10/14/2020 1417    Lab Results  Component Value Date   LABMICR Comment 10/14/2020    Pertinent Imaging:  No results found for this or any previous visit.  No results found for this or any previous visit.  No results found for this or any previous visit.  No results found for this or any previous visit.  No results found for this or any previous visit.  No results found for this or any previous visit.  No results found for this or any previous visit.  No results found for this or any previous visit.   Assessment & Plan:    1. Prostate cancer ([place]) -RTC 3 months with PSA - Urinalysis, Routine w reflex microscopic  2. Urge incontinence -mirabegron 40m daily, patient will call if he desires prescription   No follow-ups on file.  PNicolette Bang MD  [facility]

## 2020-10-19 ENCOUNTER — Other Ambulatory Visit: Payer: Self-pay | Admitting: Family Medicine

## 2020-10-27 ENCOUNTER — Telehealth: Payer: Self-pay

## 2020-10-27 ENCOUNTER — Encounter: Payer: Self-pay | Admitting: Urology

## 2020-10-27 NOTE — Telephone Encounter (Signed)
Spoke with patient in regards to telephone follow-up appointment with Freeman Caldron PA on 10/30/20 @ 1:00pm. Patient verbalized understanding of appointment date and time. Reviewed meaningful use, AUA and prostate questions. TM

## 2020-10-27 NOTE — Progress Notes (Signed)
Patient has telephone follow-up appointment with Ashlyn Bruning PA. Patient states nocturia 2 times per night. Patient denies dysuria or hematuria. Patient states urine stream is strong and steady. Patient states that he is emptying his bladder completely. Patient states urgency and occasionally has trouble holding his urine. Patient states leakage of urine. Patient denies pushing or straining during urination. Patient states that he has had a follow-up appointment with [facility] last week.

## 2020-10-27 NOTE — Progress Notes (Signed)
  Radiation Oncology         [id number]) [phone] ________________________________  Name: Adam Lee. MRN: [id number]  Date: 09/30/2020  DOB: 18-Nov-1955  End of Treatment Note  Diagnosis:   65 y.o. gentleman with adverse pathology s/p RALP 01/2020 for stage pT3b, pN0 Gleason 4+5 prostate cancer with pretreatment PSA of 6.8.and post op PSA of 1.2     Indication for treatment:  Curative, Definitive Radiotherapy       Radiation treatment dates:   11/2-12/28/21  Site/dose:  1. The prostate fossa and pelvic lymph nodes were initially treated to 45 Gy in 25 fractions of 1.8 Gy  2. The prostate fossa only was boosted to 68.4 Gy with 13 additional fractions of 1.8 Gy   Beams/energy:  1. The prostate fossa  and pelvic lymph nodes were initially treated using VMAT intensity modulated radiotherapy delivering 6 megavolt photons. Image guidance was performed with CB-CT studies prior to each fraction. He was immobilized with a body fix lower extremity mold.  2. The prostate fossa only was boosted using VMAT intensity modulated radiotherapy delivering 6 megavolt photons. Image guidance was performed with CB-CT studies prior to each fraction. He was immobilized with a body fix lower extremity mold.  Narrative: The patient tolerated radiation treatment relatively well.   The patient experienced some minor urinary irritation and modest fatigue.    Plan: The patient has completed radiation treatment. He will return to radiation oncology clinic for routine followup in one month. I advised him to call or return sooner if he has any questions or concerns related to his recovery or treatment. ________________________________  Sheral Apley. Tammi Klippel, M.D.

## 2020-10-30 ENCOUNTER — Other Ambulatory Visit: Payer: Self-pay

## 2020-10-30 ENCOUNTER — Ambulatory Visit
Admission: RE | Admit: 2020-10-30 | Discharge: 2020-10-30 | Disposition: A | Discharge status: Home / Self Care | Payer: Medicare HMO | Source: Ambulatory Visit | Attending: Urology | Admitting: Urology

## 2020-10-30 DIAGNOSIS — C61 Malignant neoplasm of prostate: Secondary | ICD-10-CM

## 2020-10-30 NOTE — Progress Notes (Signed)
Radiation Oncology         [phone]) [phone] ________________________________  Name: Adam Lee. MRN: [id number]  Date: 10/30/2020  DOB: 02-19-1956  Post Treatment Note  CC: Kathyrn Drown, MD  Cleon Gustin, MD  Diagnosis:   65 y.o. gentleman with adverse pathology s/p RALP 01/2020 for stage pT3b, pN0 Gleason 4+5 prostate cancer with pretreatment PSA of 6.8.and post op PSA of 1.2    Interval Since Last Radiation:  4.5 weeks  11/2-12/28/21: 1. The prostate fossa and pelvic lymph nodes were initially treated to 45 Gy in 25 fractions of 1.8 Gy  2. The prostate fossa only was boosted to 68.4 Gy with 13 additional fractions of 1.8 Gy concurrent with ADT (started 07/03/20)  Narrative:  I spoke with the patient to conduct his routine scheduled 1 month follow up visit via telephone to spare the patient unnecessary potential exposure in the healthcare setting during the current COVID-19 pandemic.  The patient was notified in advance and gave permission to proceed with this visit format. He tolerated radiation treatment relatively well with only minor urinary irritation and modest fatigue.  He did experience increased frequency, urgency and occasional small-volume urinary incontinence if he postponed voiding for too long.   He denied any dysuria, gross hematuria, hesitancy or straining.  He maintained a strong flow of stream and was able to empty his bladder well on voiding.  He reported nocturia 2-6 times per night but all of his LUTS have significantly improved towards the end of treatment.  He did also experience some frequent, loose stools but denied outright diarrhea.                        On review of systems, the patient states that he is doing very well overall.  He continues with some persistent increased frequency, urgency and incontinence but feels that this is gradually improving.  He denies dysuria, gross hematuria, straining to void, incomplete bladder emptying or hesitancy.  He was  recently started on Myrbetriq and feels that this has been somewhat helpful in regards to the frequency, urgency and urge incontinence.  He denies any abdominal pain, nausea, vomiting, diarrhea or constipation.  He reports a healthy appetite and is maintaining his weight.  He continues with mild fatigue but nothing that is debilitating.  Overall, he is quite pleased with his progress to date.  ALLERGIES:  has No Known Allergies.  Meds: Current Outpatient Medications  Medication Sig Dispense Refill  . atorvastatin (LIPITOR) 10 MG tablet TAKE 1 TABLET BY MOUTH EVERY DAY 90 tablet 1  . blood glucose meter kit and supplies Dispense based on patient and insurance preference. Use to check sugars once a day (FOR ICD-10 E10.9, E11.9). 1 each 0  . HYDROcodone-acetaminophen (NORCO) 5-325 MG tablet Take 1-2 tablets by mouth every 6 (six) hours as needed for moderate pain. 20 tablet 0  . Lancets (ONETOUCH DELICA PLUS [id number]) MISC USE TO OBTAIN A BLOOD SPECIMEN TO TEST BLOOD SUGAR DAILY 100 each 0  . lisinopril (ZESTRIL) 10 MG tablet TAKE 1 TABLET BY MOUTH EVERY DAY 90 tablet 1  . metFORMIN (GLUCOPHAGE) 500 MG tablet Take 2 tablets (1,000 mg total) by mouth 2 (two) times daily. 360 tablet 1  . mirabegron ER (MYRBETRIQ) 25 MG TB24 tablet Take 1 tablet (25 mg total) by mouth daily. 30 tablet 0  . ONETOUCH ULTRA test strip USE TO TEST BLOOD SUGAR DAILY 100 strip 0  .  nitroGLYCERIN (NITROSTAT) 0.4 MG SL tablet Place 1 tablet (0.4 mg total) under the tongue every 5 (five) minutes as needed for chest pain. (Patient not taking: Reported on 10/27/2020) 100 tablet 1   No current facility-administered medications for this encounter.    Physical Findings:  vitals were not taken for this visit.   /Unable to assess due to telephone follow-up visit format.  Lab Findings: Lab Results  Component Value Date   WBC 7.1 01/15/2020   HGB 13.9 01/24/2020   HCT 43.1 01/24/2020   MCV 92.1 01/15/2020   PLT 208 01/15/2020      Radiographic Findings: No results found.  Impression/Plan: 1. 65 y.o. gentleman with adverse pathology s/p RALP 01/2020 for stage pT3b, pN0 Gleason 4+5 prostate cancer with pretreatment PSA of 6.8.and post op PSA of 1.2. He will continue to follow up with urology for ongoing PSA determinations and had a recent follow-up visit with Dr. Alyson Ingles on 10/14/2020.  He was started on Myrbetriq to help with the LUTS and is scheduled for repeat labs prior to his next scheduled visit on 01/20/2021. He understands what to expect with regards to PSA monitoring going forward. I will look forward to following his response to treatment via correspondence with urology, and would be happy to continue to participate in his care if clinically indicated. I talked to the patient about what to expect in the future, including his risk for erectile dysfunction and rectal bleeding. I encouraged him to call or return to the office if he has any questions regarding his previous radiation or possible radiation side effects. He was comfortable with this plan and will follow up as needed.      Nicholos Johns, PA-C

## 2020-11-16 ENCOUNTER — Other Ambulatory Visit: Payer: Self-pay | Admitting: Family Medicine

## 2021-01-04 ENCOUNTER — Other Ambulatory Visit: Payer: Self-pay | Admitting: Family Medicine

## 2021-01-05 NOTE — Telephone Encounter (Signed)
Please contact patient to have him schedule follow up appt. Then may route back to nurses. Thank you

## 2021-01-08 NOTE — Telephone Encounter (Signed)
Left message to schedule appointment

## 2021-01-13 NOTE — Telephone Encounter (Signed)
Left Message

## 2021-01-14 ENCOUNTER — Telehealth: Payer: Self-pay

## 2021-01-14 DIAGNOSIS — Z79899 Other long term (current) drug therapy: Secondary | ICD-10-CM | POA: Diagnosis not present

## 2021-01-14 DIAGNOSIS — C61 Malignant neoplasm of prostate: Secondary | ICD-10-CM | POA: Diagnosis not present

## 2021-01-14 DIAGNOSIS — E119 Type 2 diabetes mellitus without complications: Secondary | ICD-10-CM | POA: Diagnosis not present

## 2021-01-14 NOTE — Telephone Encounter (Signed)
Lab order was in system place on 10/14/20 and will not expire until 10/14/21. [facility] by [address] called to verify they see order and that the blood work was drawn. Maudie Mercury stated that the blood was drawn but they could not see the order. Order printed and faxed to the [facility] on [address].

## 2021-01-14 NOTE — Telephone Encounter (Signed)
Left a VM today saying he went to [facility] and had blood draw.   [facility] didn't have order. They are needing the order sent to them.  Thanks, Helene Kelp

## 2021-01-14 NOTE — Addendum Note (Signed)
Addended byIris Pert on: 01/14/2021 02:25 PM   Modules accepted: Orders

## 2021-01-15 LAB — BASIC METABOLIC PANEL
BUN/Creatinine Ratio: 16 (ref 10–24)
BUN: 25 mg/dL (ref 8–27)
CO2: 21 mmol/L (ref 20–29)
Calcium: 9.5 mg/dL (ref 8.6–10.2)
Chloride: 101 mmol/L (ref 96–106)
Creatinine, Ser: 1.53 mg/dL — ABNORMAL HIGH (ref 0.76–1.27)
Glucose: 175 mg/dL — ABNORMAL HIGH (ref 65–99)
Potassium: 4.5 mmol/L (ref 3.5–5.2)
Sodium: 140 mmol/L (ref 134–144)
eGFR: 50 mL/min/{1.73_m2} — ABNORMAL LOW (ref >59)

## 2021-01-15 LAB — PSA: Prostate Specific Ag, Serum: <0.1 ng/mL (ref 0.0–4.0)

## 2021-01-15 LAB — HEMOGLOBIN A1C
Est. average glucose Bld gHb Est-mCnc: 154 mg/dL
Hgb A1c MFr Bld: 7 % — ABNORMAL HIGH (ref 4.8–5.6)

## 2021-01-20 ENCOUNTER — Ambulatory Visit (INDEPENDENT_AMBULATORY_CARE_PROVIDER_SITE_OTHER): Payer: Medicare HMO | Admitting: Urology

## 2021-01-20 ENCOUNTER — Other Ambulatory Visit: Payer: Self-pay

## 2021-01-20 ENCOUNTER — Encounter: Payer: Self-pay | Admitting: Urology

## 2021-01-20 VITALS — BP 107/69 | HR 87 | Temp 99.0°F | Ht 74.0 in | Wt 280.0 lb

## 2021-01-20 DIAGNOSIS — C61 Malignant neoplasm of prostate: Secondary | ICD-10-CM

## 2021-01-20 DIAGNOSIS — N3941 Urge incontinence: Secondary | ICD-10-CM | POA: Diagnosis not present

## 2021-01-20 MED ORDER — GEMTESA 75 MG PO TABS: 1.0000 | ORAL_TABLET | Freq: Every day | ORAL | 0 refills | Status: DC

## 2021-01-20 MED ORDER — FESOTERODINE FUMARATE ER 8 MG PO TB24: 8.0000 mg | ORAL_TABLET | Freq: Every day | ORAL | 0 refills | Status: DC

## 2021-01-20 NOTE — Patient Instructions (Signed)
Prostate Cancer  The prostate is a male gland that helps make semen. It is located below a man's bladder, in front of the rectum. Prostate cancer is when abnormal cells grow in this gland. What are the causes? The cause of this condition is not known. What increases the risk? You are more likely to develop this condition if:  You are 67 years of age or older.  You are African American.  You have a family history of prostate cancer.  You have a family history of breast cancer. What are the signs or symptoms? Symptoms of this condition include:  A need to pee often.  Peeing that is weak, or pee that stops and starts.  Trouble starting or stopping your pee.  Inability to pee.  Blood in your pee or semen.  Pain in the lower back, lower belly (abdomen), hips, or upper thighs.  Trouble getting an erection.  Trouble emptying all of your pee. How is this treated? Treatment for this condition depends on your age, your health, the kind of treatment you like, and how far the cancer has spread. Treatments include:  Being watched. This is called observation. You will be tested from time to time, but you will not get treated. Tests are to make sure that the cancer is not growing.  Surgery. This may be done to remove the prostate, to remove the testicles, or to freeze or kill cancer cells.  Radiation. This uses a strong beam to kill cancer cells.  Ultrasound energy. This uses strong sound waves to kill cancer cells.  Chemotherapy. This uses medicines that stop cancer cells from increasing. This kills cancer cells and healthy cells.  Targeted therapy. This kills cancer cells only. Healthy cells are not affected.  Hormone treatment. This stops the body from making hormones that help the cancer cells to grow. Follow these instructions at home:  Take over-the-counter and prescription medicines only as told by your doctor.  Eat a healthy diet.  Get plenty of sleep.  Ask your  doctor for help to find a support group for men with prostate cancer.  If you have to go to the hospital, let your cancer doctor (oncologist) know.  Treatment may affect your ability to have sex. Touch, hold, hug, and caress your partner to have intimate moments.  Keep all follow-up visits as told by your doctor. This is important. Contact a doctor if:  You have new or more trouble peeing.  You have new or more blood in your pee.  You have new or more pain in your hips, back, or chest. Get help right away if:  You have weakness in your legs.  You lose feeling in your legs.  You cannot control your pee or your poop (stool).  You have chills or a fever. Summary  The prostate is a male gland that helps make semen.  Prostate cancer is when abnormal cells grow in this gland.  Treatment includes doing surgery, using medicines, using very strong beams, or watching without treatment.  Ask your doctor for help to find a support group for men with prostate cancer.  Contact a doctor if you have problems peeing or have any new pain that you did not have before. This information is not intended to replace advice given to you by your health care provider. Make sure you discuss any questions you have with your health care provider. Document Revised: 09/04/2019 Document Reviewed: 09/04/2019 Elsevier Patient Education  2021 Elsevier Inc.  

## 2021-01-20 NOTE — Progress Notes (Signed)
01/20/2021 3:15 PM   Adam Kanner Jr. 08-08-1956 [id number]  Referring provider: Kathyrn Drown, MD [place],  [place] [address]  Prostate cancer   HPI: Adam Lee is a 64yo here for followup for prostate cancer. PSA undetectable. He continue to have urinary urgency, frequency and multiple episodes of urge incontinence daily since finishing radiation. Mirabegron did not improve his incontinence. He is bothered by the urinary frequency. Urgency and incontinence   PMH: Past Medical History:  Diagnosis Date  . Anginal pain ([place]) 2019  . Diabetes mellitus without complication ([place])   . Dysrhythmia    stress related. fast HR  . History of stress test 2014   intermediate risk  . Hyperlipemia   . Hypertension   . Prostate cancer [facility])     Surgical History: Past Surgical History:  Procedure Laterality Date  . COLONOSCOPY N/A 01/03/2020   Procedure: COLONOSCOPY;  Surgeon: Rogene Houston, MD;  Location: [facility];  Service: Endoscopy;  Laterality: N/A;  1055  . HERNIA REPAIR Left    2000, Dr. Aviva Signs  . LYMPHADENECTOMY Bilateral 01/23/2020   Procedure: LYMPHADENECTOMY;  Surgeon: Alexis Frock, MD;  Location: [facility] OR[facility];  Service: Urology;  Laterality: Bilateral;  . POLYPECTOMY  01/03/2020   Procedure: POLYPECTOMY;  Surgeon: Rogene Houston, MD;  Location: [facility];  Service: Endoscopy;;  . ROBOT ASSISTED LAPAROSCOPIC RADICAL PROSTATECTOMY N/A 01/23/2020   Procedure: XI ROBOTIC ASSISTED LAPAROSCOPIC RADICAL PROSTATECTOMY;  Surgeon: Alexis Frock, MD;  Location: [facility];  Service: Urology;  Laterality: N/A;  3 HRS    Home Medications:  Allergies as of 01/20/2021   No Known Allergies     Medication List       Accurate as of January 20, 2021  3:15 PM. If you have any questions, ask your nurse or doctor.        atorvastatin 10 MG tablet Commonly known as: LIPITOR TAKE 1 TABLET BY MOUTH EVERY DAY   blood glucose meter kit and supplies Dispense  based on patient and insurance preference. Use to check sugars once a day (FOR ICD-10 E10.9, E11.9).   HYDROcodone-acetaminophen 5-325 MG tablet Commonly known as: Norco Take 1-2 tablets by mouth every 6 (six) hours as needed for moderate pain.   lisinopril 10 MG tablet Commonly known as: ZESTRIL TAKE 1 TABLET BY MOUTH EVERY DAY   metFORMIN 500 MG tablet Commonly known as: GLUCOPHAGE Take 2 tablets (1,000 mg total) by mouth 2 (two) times daily.   mirabegron ER 25 MG Tb24 tablet Commonly known as: MYRBETRIQ Take 1 tablet (25 mg total) by mouth daily.   nitroGLYCERIN 0.4 MG SL tablet Commonly known as: NITROSTAT Place 1 tablet (0.4 mg total) under the tongue every 5 (five) minutes as needed for chest pain.   OneTouch Delica Plus [id number] Misc USE TO OBTAIN A BLOOD SPECIMEN TO TEST BLOOD SUGAR DAILY   OneTouch Ultra test strip Generic drug: glucose blood USE TO TEST BLOOD SUGAR DAILY       Allergies: No Known Allergies  Family History: Family History  Problem Relation Age of Onset  . Diabetes Father   . Heart disease Mother   . Cancer Other   . Breast cancer Neg Hx   . Prostate cancer Neg Hx   . Colon cancer Neg Hx   . Pancreatic cancer Neg Hx     Social History:  reports that he has never smoked. He has never used smokeless tobacco. He reports  that he does not drink alcohol and does not use drugs.  ROS: All other review of systems were reviewed and are negative except what is noted above in HPI  Physical Exam: BP 107/69   Pulse 87   Temp 99 F (37.2 C)   Ht _0  (1.88 m)   Wt 280 lb (127 kg)   BMI 35.95 kg/m   Constitutional:  Alert and oriented, No acute distress. HEENT:  AT, moist mucus membranes.  Trachea midline, no masses. Cardiovascular: No clubbing, cyanosis, or edema. Respiratory: Normal respiratory effort, no increased work of breathing. GI: Abdomen is soft, nontender, nondistended, no abdominal masses GU: No CVA tenderness.  Lymph: No  cervical or inguinal lymphadenopathy. Skin: No rashes, bruises or suspicious lesions. Neurologic: Grossly intact, no focal deficits, moving all 4 extremities. Psychiatric: Normal mood and affect.  Laboratory Data: Lab Results  Component Value Date   WBC 7.1 01/15/2020   HGB 13.9 01/24/2020   HCT 43.1 01/24/2020   MCV 92.1 01/15/2020   PLT 208 01/15/2020    Lab Results  Component Value Date   CREATININE 1.53 (H) 01/14/2021    Lab Results  Component Value Date   PSA <0.1 03/04/2020   PSA 2.3 12/06/2014    Lab Results  Component Value Date   TESTOSTERONE 12 (L) 09/03/2020    Lab Results  Component Value Date   HGBA1C 7.0 (H) 01/14/2021    Urinalysis    Component Value Date/Time   APPEARANCEUR Clear 10/14/2020 1417   GLUCOSEU Negative 10/14/2020 1417   BILIRUBINUR Negative 10/14/2020 1417   PROTEINUR Negative 10/14/2020 1417   NITRITE Negative 10/14/2020 1417   LEUKOCYTESUR Negative 10/14/2020 1417    Lab Results  Component Value Date   LABMICR Comment 10/14/2020    Pertinent Imaging:  No results found for this or any previous visit.  No results found for this or any previous visit.  No results found for this or any previous visit.  No results found for this or any previous visit.  No results found for this or any previous visit.  No results found for this or any previous visit.  No results found for this or any previous visit.  No results found for this or any previous visit.   Assessment & Plan:    1. Prostate cancer ([place]) -RTC 3 months with PSA - Urinalysis, Routine w reflex microscopic  2. Urge incontinence -[place] 48m daily   No follow-ups on file.  PNicolette Bang MD  [facility] [place]

## 2021-01-20 NOTE — Progress Notes (Signed)
Urological Symptom Review  Patient is experiencing the following symptoms: Leakage of urine   Review of Systems  Gastrointestinal (upper)  : Negative for upper GI symptoms  Gastrointestinal (lower) : Negative for lower GI symptoms  Constitutional : Negative for symptoms  Skin: Negative for skin symptoms  Eyes: Blurred vision  Ear/Nose/Throat : Negative for Ear/Nose/Throat symptoms  Hematologic/Lymphatic: Negative for Hematologic/Lymphatic symptoms  Cardiovascular : Negative for cardiovascular symptoms  Respiratory : Negative for respiratory symptoms  Endocrine: Negative for endocrine symptoms  Musculoskeletal: Negative for musculoskeletal symptoms  Neurological: Negative for neurological symptoms  Psychologic: Negative for psychiatric symptoms

## 2021-01-21 LAB — URINALYSIS, ROUTINE W REFLEX MICROSCOPIC
Bilirubin, UA: NEGATIVE
Glucose, UA: NEGATIVE
Ketones, UA: NEGATIVE
Leukocytes,UA: NEGATIVE
Nitrite, UA: NEGATIVE
Protein,UA: NEGATIVE
RBC, UA: NEGATIVE
Specific Gravity, UA: 1.025 (ref 1.005–1.030)
Urobilinogen, Ur: 0.2 mg/dL (ref 0.2–1.0)
pH, UA: 5.5 (ref 5.0–7.5)

## 2021-01-21 NOTE — Progress Notes (Signed)
Letter sent via mail 

## 2021-01-22 NOTE — Telephone Encounter (Signed)
Called twice no response.

## 2021-02-16 ENCOUNTER — Other Ambulatory Visit: Payer: Self-pay | Admitting: Family Medicine

## 2021-04-02 ENCOUNTER — Other Ambulatory Visit: Payer: Self-pay | Admitting: Family Medicine

## 2021-04-07 ENCOUNTER — Ambulatory Visit (INDEPENDENT_AMBULATORY_CARE_PROVIDER_SITE_OTHER): Payer: Medicare HMO

## 2021-04-07 ENCOUNTER — Other Ambulatory Visit: Payer: Self-pay

## 2021-04-07 DIAGNOSIS — Z0000 Encounter for general adult medical examination without abnormal findings: Secondary | ICD-10-CM | POA: Diagnosis not present

## 2021-04-07 NOTE — Progress Notes (Signed)
Subjective:   Jerren Flinchbaugh. is a 65 y.o. male who presents for an Initial Medicare Annual Wellness Visit.  I connected with Candie Chroman today by telephone and verified that I am speaking with the correct person using two identifiers. Location patient: home Location provider: work Persons participating in the virtual visit: patient, provider.   I discussed the limitations, risks, security and privacy concerns of performing an evaluation and management service by telephone and the availability of in person appointments. I also discussed with the patient that there may be a patient responsible charge related to this service. The patient expressed understanding and verbally consented to this telephonic visit.    Interactive audio and video telecommunications were attempted between this provider and patient, however failed, due to patient having technical difficulties OR patient did not have access to video capability.  We continued and completed visit with audio only.     Review of Systems    N/A  Cardiac Risk Factors include: advanced age (>54mn, >>98women);hypertension;dyslipidemia;male gender;diabetes mellitus;obesity (BMI >30kg/m2)     Objective:    Today's Vitals   There is no height or weight on file to calculate BMI.  Advanced Directives 04/07/2021 10/27/2020 01/23/2020 01/15/2020 01/03/2020 11/09/2019 05/06/2015  Does Patient Have a Medical Advance Directive? _0  No No  Would patient like information on creating a medical advance directive? No - Patient declined - No - Patient declined - No - Patient declined No - Patient declined No - patient declined information    Current Medications (verified) Outpatient Encounter Medications as of 04/07/2021  Medication Sig   aspirin 81 MG chewable tablet Chew by mouth daily.   atorvastatin (LIPITOR) 10 MG tablet TAKE 1 TABLET BY MOUTH EVERY DAY   blood glucose meter kit and supplies Dispense based on patient and insurance  preference. Use to check sugars once a day (FOR ICD-10 E10.9, E11.9).   fesoterodine (TOVIAZ) 8 MG TB24 tablet Take 1 tablet (8 mg total) by mouth daily.   Lancets (ONETOUCH DELICA PLUS [id number] MISC USE TO OBTAIN A BLOOD SPECIMEN TO TEST BLOOD SUGAR DAILY   lisinopril (ZESTRIL) 10 MG tablet TAKE 1 TABLET BY MOUTH EVERY DAY   metFORMIN (GLUCOPHAGE) 500 MG tablet TAKE 2 TABLETS BY MOUTH TWICE A DAY   ONETOUCH ULTRA test strip USE TO TEST BLOOD SUGAR DAILY   Vibegron (GEMTESA) 75 MG TABS Take 1 capsule by mouth daily.   HYDROcodone-acetaminophen (NORCO) 5-325 MG tablet Take 1-2 tablets by mouth every 6 (six) hours as needed for moderate pain. (Patient not taking: Reported on 04/07/2021)   mirabegron ER (MYRBETRIQ) 25 MG TB24 tablet Take 1 tablet (25 mg total) by mouth daily. (Patient not taking: Reported on 04/07/2021)   nitroGLYCERIN (NITROSTAT) 0.4 MG SL tablet Place 1 tablet (0.4 mg total) under the tongue every 5 (five) minutes as needed for chest pain. (Patient not taking: Reported on 04/07/2021)   No facility-administered encounter medications on file as of 04/07/2021.    Allergies (verified) Patient has no known allergies.   History: Past Medical History:  Diagnosis Date   Anginal pain ([facility] 2019   Diabetes mellitus without complication ([facility]    Dysrhythmia    stress related. fast HR   History of stress test 2014   intermediate risk   Hyperlipemia    Hypertension    Prostate cancer ([facility]    Past Surgical History:  Procedure Laterality Date   COLONOSCOPY N/A 01/03/2020   Procedure: COLONOSCOPY;  Surgeon: Rogene Houston, MD;  Location: [facility];  Service: Endoscopy;  Laterality: N/A;  [place] Left    2000, Dr. Aviva Signs   LYMPHADENECTOMY Bilateral 01/23/2020   Procedure: LYMPHADENECTOMY;  Surgeon: Alexis Frock, MD;  Location: [facility];  Service: Urology;  Laterality: Bilateral;   POLYPECTOMY  01/03/2020   Procedure: POLYPECTOMY;  Surgeon: Rogene Houston, MD;   Location: [facility];  Service: Endoscopy;;   ROBOT ASSISTED LAPAROSCOPIC RADICAL PROSTATECTOMY N/A 01/23/2020   Procedure: XI ROBOTIC ASSISTED LAPAROSCOPIC RADICAL PROSTATECTOMY;  Surgeon: Alexis Frock, MD;  Location: [facility];  Service: Urology;  Laterality: N/A;  3 HRS   Family History  Problem Relation Age of Onset   Diabetes Father    Heart disease Mother    Cancer Other    Breast cancer Neg Hx    Prostate cancer Neg Hx    Colon cancer Neg Hx    Pancreatic cancer Neg Hx    Social History   Socioeconomic History   Marital status: Married    Spouse name: Not on file   Number of children: 2   Years of education: Not on file   Highest education level: Not on file  Occupational History   Not on file  Tobacco Use   Smoking status: Never   Smokeless tobacco: Never  Vaping Use   Vaping Use: Never used  Substance and Sexual Activity   Alcohol use: No    Alcohol/week: 0.0 standard drinks   Drug use: No   Sexual activity: Yes  Other Topics Concern   Not on file  Social History Narrative   Not on file   Social Determinants of Health   Financial Resource Strain: Low Risk    Difficulty of Paying Living Expenses: Not hard at all  Food Insecurity: No Food Insecurity   Worried About Charity fundraiser in the Last Year: Never true   [place] in the Last Year: Never true  Transportation Needs: No Transportation Needs   Lack of Transportation (Medical): No   Lack of Transportation (Non-Medical): No  Physical Activity: Sufficiently Active   Days of Exercise per Week: 5 days   Minutes of Exercise per Session: 30 min  Stress: No Stress Concern Present   Feeling of Stress : Not at all  Social Connections: Moderately Integrated   Frequency of Communication with Friends and Family: More than three times a week   Frequency of Social Gatherings with Friends and Family: More than three times a week   Attends Religious Services: 1 to 4 times per year   Active Member of  [employer] or Organizations: No   Attends Music therapist: Never   Marital Status: Married    Tobacco Counseling Counseling given: Not Answered   Clinical Intake:  Pre-visit preparation completed: Yes  Pain : No/denies pain     Nutritional Risks: None Diabetes: Yes CBG done?: No Did pt. bring in CBG monitor from home?: No  How often do you need to have someone help you when you read instructions, pamphlets, or other written materials from your doctor or pharmacy?: 1 - Never  Diabetic?Yes Nutrition Risk Assessment:  Has the patient had any N/V/D within the last 2 months?  No  Does the patient have any non-healing wounds?  No  Has the patient had any unintentional weight loss or weight gain?  No   Diabetes:  Is the patient diabetic?  Yes  If diabetic, was  a CBG obtained today?  No  Did the patient bring in their glucometer from home?  No  How often do you monitor your CBG's? Patient states checks glucose 2-3x per day .   Financial Strains and Diabetes Management:  Are you having any financial strains with the device, your supplies or your medication? No .  Does the patient want to be seen by Chronic Care Management for management of their diabetes?  No  Would the patient like to be referred to a Nutritionist or for Diabetic Management?  No   Diabetic Exams:  Diabetic Eye Exam: Overdue for diabetic eye exam. Pt has been advised about the importance in completing this exam. Patient advised to call and schedule an eye exam. Diabetic Foot Exam: Overdue, Pt has been advised about the importance in completing this exam. Pt is scheduled for diabetic foot exam on next office visit.   Interpreter Needed?: No  Information entered by :: [place] of Daily Living In your present state of health, do you have any difficulty performing the following activities: 04/07/2021  Hearing? N  Vision? N  Difficulty concentrating or making decisions? N  Walking  or climbing stairs? N  Dressing or bathing? N  Doing errands, shopping? N  Preparing Food and eating ? N  Using the Toilet? N  In the past six months, have you accidently leaked urine? Y  Comment Currently wears incontinence  Do you have problems with loss of bowel control? N  Managing your Medications? N  Managing your Finances? N  Housekeeping or managing your Housekeeping? N  Some recent data might be hidden    Patient Care Team: Kathyrn Drown, MD as PCP - General (Family Medicine) Josue Hector, MD as Consulting Physician (Cardiology)  Indicate any recent Medical Services you may have received from other than [facility] providers in the past year (date may be approximate).     Assessment:   This is a routine wellness examination for Daeshaun.  Hearing/Vision screen Vision Screening - Comments:: Patient states has not had eye examined in a few years. Currently wears glasses   Dietary issues and exercise activities discussed: Current Exercise Habits: Home exercise routine, Type of exercise: walking, Time (Minutes): 30, Frequency (Times/Week): 5, Weekly Exercise (Minutes/Week): 150, Intensity: Mild, Exercise limited by: None identified   Goals Addressed             This Visit's Progress    Exercise 150 min/wk Moderate Activity       HEMOGLOBIN A1C < 7       Weight (lb) < 230 lb (104.3 kg)         Depression Screen PHQ 2/9 Scores 04/07/2021 09/19/2020 12/05/2018 09/06/2018 05/13/2015  PHQ - 2 Score 0 0 0 0 0    Fall Risk Fall Risk  04/07/2021 09/19/2020 03/11/2020 08/14/2019 12/05/2018  Falls in the past year? 1 0 0 0 0  Number falls in past yr: 0 - - - 0  Injury with Fall? 0 - - - 0  Risk for fall due to : No Fall Risks - - - -  Follow up Falls evaluation completed;Falls prevention discussed Falls evaluation completed Falls evaluation completed Falls evaluation completed Falls evaluation completed    FALL RISK PREVENTION PERTAINING TO THE HOME:  Any stairs in or around the  home? Yes  If so, are there any without handrails? No  Home free of loose throw rugs in walkways, pet beds, electrical cords, etc? Yes  Adequate  lighting in your home to reduce risk of falls? Yes   ASSISTIVE DEVICES UTILIZED TO PREVENT FALLS:  Life alert? No  Use of a cane, walker or w/c? No  Grab bars in the bathroom? Yes  Shower chair or bench in shower? Yes  Elevated toilet seat or a handicapped toilet? Yes    Cognitive Function:  Normal cognitive status assessed by direct observation by this Nurse Health Advisor. No abnormalities found.        Immunizations Immunization History  Administered Date(s) Administered   Pneumococcal Conjugate-13 12/05/2018   Pneumococcal Polysaccharide-23 03/11/2020    TDAP status: Due, Education has been provided regarding the importance of this vaccine. Advised may receive this vaccine at local pharmacy or Health Dept. Aware to provide a copy of the vaccination record if obtained from local pharmacy or Health Dept. Verbalized acceptance and understanding.  Flu Vaccine status: Declined, Education has been provided regarding the importance of this vaccine but patient still declined. Advised may receive this vaccine at local pharmacy or Health Dept. Aware to provide a copy of the vaccination record if obtained from local pharmacy or Health Dept. Verbalized acceptance and understanding.  Pneumococcal vaccine status: Up to date  Covid-19 vaccine status: Declined, Education has been provided regarding the importance of this vaccine but patient still declined. Advised may receive this vaccine at local pharmacy or Health Dept.or vaccine clinic. Aware to provide a copy of the vaccination record if obtained from local pharmacy or Health Dept. Verbalized acceptance and understanding.  Qualifies for Shingles Vaccine? Yes   Zostavax completed No   Shingrix Completed?: No.    Education has been provided regarding the importance of this vaccine. Patient has  been advised to call insurance company to determine out of pocket expense if they have not yet received this vaccine. Advised may also receive vaccine at local pharmacy or Health Dept. Verbalized acceptance and understanding.  Screening Tests Health Maintenance  Topic Date Due   TETANUS/TDAP  Never done   Zoster Vaccines- Shingrix (1 of 2) Never done   OPHTHALMOLOGY EXAM  08/15/2015   FOOT EXAM  03/11/2021   HEMOGLOBIN A1C  07/16/2021   COLONOSCOPY (Pts 45-51yr Insurance coverage will need to be confirmed)  01/03/2023   Hepatitis C Screening  Completed   PNA vac Low Risk Adult  Completed   HPV VACCINES  Aged Out   INFLUENZA VACCINE  Discontinued   COVID-19 Vaccine  Discontinued    Health Maintenance  Health Maintenance Due  Topic Date Due   TETANUS/TDAP  Never done   Zoster Vaccines- Shingrix (1 of 2) Never done   OPHTHALMOLOGY EXAM  08/15/2015   FOOT EXAM  03/11/2021    Colorectal cancer screening: Type of screening: Colonoscopy. Completed 01/03/2020. Repeat every 3 years  Lung Cancer Screening: (Low Dose CT Chest recommended if Age 523-80years, 30 pack-year currently smoking OR have quit w/in 15years.) does not qualify.   Lung Cancer Screening Referral: N/A   Additional Screening:  Hepatitis C Screening: does qualify; Completed 12/05/2018  Vision Screening: Recommended annual ophthalmology exams for early detection of glaucoma and other disorders of the eye. Is the patient up to date with their annual eye exam?  No  Who is the provider or what is the name of the office in which the patient attends annual eye exams? Dr. CJorja Loa If pt is not established with a provider, would they like to be referred to a provider to establish care? No .   Dental Screening:  Recommended annual dental exams for proper oral hygiene  Community Resource Referral / Chronic Care Management: CRR required this visit?  No   CCM required this visit?  No      Plan:     I have personally  reviewed and noted the following in the patient's chart:   Medical and social history Use of alcohol, tobacco or illicit drugs  Current medications and supplements including opioid prescriptions. Patient is not currently taking opioid prescriptions. Functional ability and status Nutritional status Physical activity Advanced directives List of other physicians Hospitalizations, surgeries, and ER visits in previous 12 months Vitals Screenings to include cognitive, depression, and falls Referrals and appointments  In addition, I have reviewed and discussed with patient certain preventive protocols, quality metrics, and best practice recommendations. A written personalized care plan for preventive services as well as general preventive health recommendations were provided to patient.     Ofilia Neas, LPN   06/12/2425   Nurse Notes: None

## 2021-04-07 NOTE — Patient Instructions (Signed)
Adam Lee , Thank you for taking time to come for your Medicare Wellness Visit. I appreciate your ongoing commitment to your health goals. Please review the following plan we discussed and let me know if I can assist you in the future.   Screening recommendations/referrals: Colonoscopy: Up to date, next due 01/03/2023 Recommended yearly ophthalmology/optometry visit for glaucoma screening and checkup Recommended yearly dental visit for hygiene and checkup  Vaccinations: Influenza vaccine: Up to date, next due fall 2022  Pneumococcal vaccine: Completed series  Tdap vaccine: Currently due, you may await and injury to receive  Shingles vaccine: Currently due for Shingrix, if you would like to receive we recommend that you do so at your local pharmacy    Advanced directives: Advance directive discussed with you today. Even though you declined this today please call our office should you change your mind and we can give you the proper paperwork for you to fill out.   Conditions/risks identified: None   Next appointment: 04/13/2022 @ 9:40 am with [place] 65 Years and Older, Male Preventive care refers to lifestyle choices and visits with your health care provider that can promote health and wellness. What does preventive care include? A yearly physical exam. This is also called an annual well check. Dental exams once or twice a year. Routine eye exams. Ask your health care provider how often you should have your eyes checked. Personal lifestyle choices, including: Daily care of your teeth and gums. Regular physical activity. Eating a healthy diet. Avoiding tobacco and drug use. Limiting alcohol use. Practicing safe sex. Taking low doses of aspirin every day. Taking vitamin and mineral supplements as recommended by your health care provider. What happens during an annual well check? The services and screenings done by your health care provider during your  annual well check will depend on your age, overall health, lifestyle risk factors, and family history of disease. Counseling  Your health care provider may ask you questions about your: Alcohol use. Tobacco use. Drug use. Emotional well-being. Home and relationship well-being. Sexual activity. Eating habits. History of falls. Memory and ability to understand (cognition). Work and work Statistician. Screening  You may have the following tests or measurements: Height, weight, and BMI. Blood pressure. Lipid and cholesterol levels. These may be checked every 5 years, or more frequently if you are over 62 years old. Skin check. Lung cancer screening. You may have this screening every year starting at age 64 if you have a 30-pack-year history of smoking and currently smoke or have quit within the past 15 years. Fecal occult blood test (FOBT) of the stool. You may have this test every year starting at age 18. Flexible sigmoidoscopy or colonoscopy. You may have a sigmoidoscopy every 5 years or a colonoscopy every 10 years starting at age 77. Prostate cancer screening. Recommendations will vary depending on your family history and other risks. Hepatitis C blood test. Hepatitis B blood test. Sexually transmitted disease (STD) testing. Diabetes screening. This is done by checking your blood sugar (glucose) after you have not eaten for a while (fasting). You may have this done every 1-3 years. Abdominal aortic aneurysm (AAA) screening. You may need this if you are a current or former smoker. Osteoporosis. You may be screened starting at age 7 if you are at high risk. Talk with your health care provider about your test results, treatment options, and if necessary, the need for more tests. Vaccines  Your health care provider may  recommend certain vaccines, such as: Influenza vaccine. This is recommended every year. Tetanus, diphtheria, and acellular pertussis (Tdap, Td) vaccine. You may need a Td  booster every 10 years. Zoster vaccine. You may need this after age 60. Pneumococcal 13-valent conjugate (PCV13) vaccine. One dose is recommended after age 76. Pneumococcal polysaccharide (PPSV23) vaccine. One dose is recommended after age 18. Talk to your health care provider about which screenings and vaccines you need and how often you need them. This information is not intended to replace advice given to you by your health care provider. Make sure you discuss any questions you have with your health care provider. Document Released: 10/17/2015 Document Revised: 06/09/2016 Document Reviewed: 07/22/2015 Elsevier Interactive Patient Education  2017 [place] Prevention in the Home Falls can cause injuries. They can happen to people of all ages. There are many things you can do to make your home safe and to help prevent falls. What can I do on the outside of my home? Regularly fix the edges of walkways and driveways and fix any cracks. Remove anything that might make you trip as you walk through a door, such as a raised step or threshold. Trim any bushes or trees on the path to your home. Use bright outdoor lighting. Clear any walking paths of anything that might make someone trip, such as rocks or tools. Regularly check to see if handrails are loose or broken. Make sure that both sides of any steps have handrails. Any raised decks and porches should have guardrails on the edges. Have any leaves, snow, or ice cleared regularly. Use sand or salt on walking paths during winter. Clean up any spills in your garage right away. This includes oil or grease spills. What can I do in the bathroom? Use night lights. Install grab bars by the toilet and in the tub and shower. Do not use towel bars as grab bars. Use non-skid mats or decals in the tub or shower. If you need to sit down in the shower, use a plastic, non-slip stool. Keep the floor dry. Clean up any water that spills on the floor  as soon as it happens. Remove soap buildup in the tub or shower regularly. Attach bath mats securely with double-sided non-slip rug tape. Do not have throw rugs and other things on the floor that can make you trip. What can I do in the bedroom? Use night lights. Make sure that you have a light by your bed that is easy to reach. Do not use any sheets or blankets that are too big for your bed. They should not hang down onto the floor. Have a firm chair that has side arms. You can use this for support while you get dressed. Do not have throw rugs and other things on the floor that can make you trip. What can I do in the kitchen? Clean up any spills right away. Avoid walking on wet floors. Keep items that you use a lot in easy-to-reach places. If you need to reach something above you, use a strong step stool that has a grab bar. Keep electrical cords out of the way. Do not use floor polish or wax that makes floors slippery. If you must use wax, use non-skid floor wax. Do not have throw rugs and other things on the floor that can make you trip. What can I do with my stairs? Do not leave any items on the stairs. Make sure that there are handrails on both sides of  the stairs and use them. Fix handrails that are broken or loose. Make sure that handrails are as long as the stairways. Check any carpeting to make sure that it is firmly attached to the stairs. Fix any carpet that is loose or worn. Avoid having throw rugs at the top or bottom of the stairs. If you do have throw rugs, attach them to the floor with carpet tape. Make sure that you have a light switch at the top of the stairs and the bottom of the stairs. If you do not have them, ask someone to add them for you. What else can I do to help prevent falls? Wear shoes that: Do not have high heels. Have rubber bottoms. Are comfortable and fit you well. Are closed at the toe. Do not wear sandals. If you use a stepladder: Make sure that it is  fully opened. Do not climb a closed stepladder. Make sure that both sides of the stepladder are locked into place. Ask someone to hold it for you, if possible. Clearly mark and make sure that you can see: Any grab bars or handrails. First and last steps. Where the edge of each step is. Use tools that help you move around (mobility aids) if they are needed. These include: Canes. Walkers. Scooters. Crutches. Turn on the lights when you go into a dark area. Replace any light bulbs as soon as they burn out. Set up your furniture so you have a clear path. Avoid moving your furniture around. If any of your floors are uneven, fix them. If there are any pets around you, be aware of where they are. Review your medicines with your doctor. Some medicines can make you feel dizzy. This can increase your chance of falling. Ask your doctor what other things that you can do to help prevent falls. This information is not intended to replace advice given to you by your health care provider. Make sure you discuss any questions you have with your health care provider. Document Released: 07/17/2009 Document Revised: 02/26/2016 Document Reviewed: 10/25/2014 Elsevier Interactive Patient Education  2017 [employer].

## 2021-04-17 ENCOUNTER — Other Ambulatory Visit: Payer: Self-pay

## 2021-04-17 ENCOUNTER — Other Ambulatory Visit: Payer: Medicare HMO

## 2021-04-17 DIAGNOSIS — C61 Malignant neoplasm of prostate: Secondary | ICD-10-CM | POA: Diagnosis not present

## 2021-04-22 LAB — PSA: Prostate Specific Ag, Serum: <0.1 ng/mL (ref 0.0–4.0)

## 2021-04-22 LAB — TESTOSTERONE,FREE AND TOTAL
Testosterone, Free: 7.8 pg/mL (ref 6.6–18.1)
Testosterone: 594 ng/dL (ref 264–916)

## 2021-04-24 ENCOUNTER — Other Ambulatory Visit: Payer: Self-pay

## 2021-04-24 ENCOUNTER — Encounter: Payer: Self-pay | Admitting: Urology

## 2021-04-24 ENCOUNTER — Ambulatory Visit (INDEPENDENT_AMBULATORY_CARE_PROVIDER_SITE_OTHER): Payer: Medicare HMO | Admitting: Urology

## 2021-04-24 VITALS — BP 129/76 | HR 88 | Temp 98.6°F

## 2021-04-24 DIAGNOSIS — N3941 Urge incontinence: Secondary | ICD-10-CM | POA: Diagnosis not present

## 2021-04-24 DIAGNOSIS — C61 Malignant neoplasm of prostate: Secondary | ICD-10-CM | POA: Diagnosis not present

## 2021-04-24 LAB — URINALYSIS, ROUTINE W REFLEX MICROSCOPIC
Bilirubin, UA: NEGATIVE
Glucose, UA: NEGATIVE
Ketones, UA: NEGATIVE
Leukocytes,UA: NEGATIVE
Nitrite, UA: NEGATIVE
Specific Gravity, UA: >1.03 — ABNORMAL HIGH (ref 1.005–1.030)
Urobilinogen, Ur: 0.2 mg/dL (ref 0.2–1.0)
pH, UA: 5.5 (ref 5.0–7.5)

## 2021-04-24 LAB — MICROSCOPIC EXAMINATION
Bacteria, UA: NONE SEEN
Epithelial Cells (non renal): NONE SEEN /hpf (ref 0–10)
Renal Epithel, UA: NONE SEEN /hpf
WBC, UA: NONE SEEN /hpf (ref 0–5)

## 2021-04-24 MED ORDER — FESOTERODINE FUMARATE ER 8 MG PO TB24: 8.0000 mg | ORAL_TABLET | Freq: Every day | ORAL | 0 refills | Status: DC

## 2021-04-24 NOTE — Progress Notes (Signed)
04/24/2021 10:16 AM   [name]. 08-04-1956 [id number]  Referring provider: Kathyrn Drown, MD [place],  [place] [address]  Followup prostate cancer and OAB  HPI: Mr Pattison is a 65yo here for followup for prostate cancer and OAB. PSA undetectable. He continues to have urinary urgency and urge incontinence. He soaks 3 pads per day. He has tried mirabegron and gemtesa with poor results. Nocturia 1-2x.    PMH: Past Medical History:  Diagnosis Date   Anginal pain ([place]) 2019   Diabetes mellitus without complication ([place])    Dysrhythmia    stress related. fast HR   History of stress test 2014   intermediate risk   Hyperlipemia    Hypertension    Prostate cancer [facility])     Surgical History: Past Surgical History:  Procedure Laterality Date   COLONOSCOPY N/A 01/03/2020   Procedure: COLONOSCOPY;  Surgeon: Rogene Houston, MD;  [facility];  Service: Endoscopy;  Laterality: N/A;  [place] Left    2000, Dr. Aviva Signs   LYMPHADENECTOMY Bilateral 01/23/2020   Procedure: LYMPHADENECTOMY;  Surgeon: Alexis Frock, MD;  [facility];  Service: Urology;  Laterality: Bilateral;   POLYPECTOMY  01/03/2020   Procedure: POLYPECTOMY;  Surgeon: Rogene Houston, MD;  [facility];  Service: Endoscopy;;   ROBOT ASSISTED LAPAROSCOPIC RADICAL PROSTATECTOMY N/A 01/23/2020   Procedure: XI ROBOTIC ASSISTED LAPAROSCOPIC RADICAL PROSTATECTOMY;  Surgeon: Alexis Frock, MD;  [facility];  Service: Urology;  Laterality: N/A;  3 HRS    Home Medications:  Allergies as of 04/24/2021   No Known Allergies      Medication List        Accurate as of April 24, 2021 10:16 AM. If you have any questions, ask your nurse or doctor.          aspirin 81 MG chewable tablet Chew by mouth daily.   atorvastatin 10 MG tablet Commonly known as: LIPITOR TAKE 1 TABLET BY MOUTH EVERY DAY   blood glucose meter kit and supplies Dispense  based on patient and insurance preference. Use to check sugars once a day (FOR ICD-10 E10.9, E11.9).   fesoterodine 8 MG Tb24 tablet Commonly known as: TOVIAZ Take 1 tablet (8 mg total) by mouth daily.   Gemtesa 75 MG Tabs Generic drug: Vibegron Take 1 capsule by mouth daily.   HYDROcodone-acetaminophen 5-325 MG tablet Commonly known as: Norco Take 1-2 tablets by mouth every 6 (six) hours as needed for moderate pain.   lisinopril 10 MG tablet Commonly known as: ZESTRIL TAKE 1 TABLET BY MOUTH EVERY DAY   metFORMIN 500 MG tablet Commonly known as: GLUCOPHAGE TAKE 2 TABLETS BY MOUTH TWICE A DAY   mirabegron ER 25 MG Tb24 tablet Commonly known as: MYRBETRIQ Take 1 tablet (25 mg total) by mouth daily.   nitroGLYCERIN 0.4 MG SL tablet Commonly known as: NITROSTAT Place 1 tablet (0.4 mg total) under the tongue every 5 (five) minutes as needed for chest pain.   OneTouch Delica Plus [id number] Misc USE TO OBTAIN A BLOOD SPECIMEN TO TEST BLOOD SUGAR DAILY   OneTouch Ultra test strip Generic drug: glucose blood USE TO TEST BLOOD SUGAR DAILY   traMADol 50 MG tablet Commonly known as: ULTRAM Take 1 tablet by mouth every 6 (six) hours as needed.        Allergies: No Known Allergies  Family History: Family History  Problem Relation Age of  Onset   Diabetes Father    Heart disease Mother    Cancer Other    Breast cancer Neg Hx    Prostate cancer Neg Hx    Colon cancer Neg Hx    Pancreatic cancer Neg Hx     Social History:  reports that he has never smoked. He has never used smokeless tobacco. He reports that he does not drink alcohol and does not use drugs.  ROS: All other review of systems were reviewed and are negative except what is noted above in HPI  Physical Exam: BP 129/76   Pulse 88   Temp 98.6 F (37 C)   Constitutional:  Alert and oriented, No acute distress. HEENT: [place] AT, moist mucus membranes.  Trachea midline, no masses. Cardiovascular: No clubbing,  cyanosis, or edema. Respiratory: Normal respiratory effort, no increased work of breathing. GI: Abdomen is soft, nontender, nondistended, no abdominal masses GU: No CVA tenderness.  Lymph: No cervical or inguinal lymphadenopathy. Skin: No rashes, bruises or suspicious lesions. Neurologic: Grossly intact, no focal deficits, moving all 4 extremities. Psychiatric: Normal mood and affect.  Laboratory Data: Lab Results  Component Value Date   WBC 7.1 01/15/2020   HGB 13.9 01/24/2020   HCT 43.1 01/24/2020   MCV 92.1 01/15/2020   PLT 208 01/15/2020    Lab Results  Component Value Date   CREATININE 1.53 (H) 01/14/2021    Lab Results  Component Value Date   PSA <0.1 03/04/2020   PSA 2.3 12/06/2014    Lab Results  Component Value Date   TESTOSTERONE 594 04/17/2021    Lab Results  Component Value Date   HGBA1C 7.0 (H) 01/14/2021    Urinalysis    Component Value Date/Time   APPEARANCEUR Clear 01/20/2021 1459   GLUCOSEU Negative 01/20/2021 1459   BILIRUBINUR Negative 01/20/2021 1459   PROTEINUR Negative 01/20/2021 1459   NITRITE Negative 01/20/2021 1459   LEUKOCYTESUR Negative 01/20/2021 1459    Lab Results  Component Value Date   LABMICR Comment 10/14/2020    Pertinent Imaging:  No results found for this or any previous visit.  No results found for this or any previous visit.  No results found for this or any previous visit.  No results found for this or any previous visit.  No results found for this or any previous visit.  No results found for this or any previous visit.  No results found for this or any previous visit.  No results found for this or any previous visit.   Assessment & Plan:    1. Prostate cancer ([place]) RTC 3 months with PSA - Urinalysis, Routine w reflex microscopic  2. Urge incontinence -Toviaz 48m daily.   No follow-ups on file.  PNicolette Bang MD  [facility] [place]

## 2021-04-24 NOTE — Progress Notes (Signed)
Urological Symptom Review  Patient is experiencing the following symptoms: Frequent urination Get up at night to urinate Leakage of urine   Review of Systems  Gastrointestinal (upper)  : Negative for upper GI symptoms  Gastrointestinal (lower) : Negative for lower GI symptoms  Constitutional : Negative for symptoms  Skin: Negative for skin symptoms  Eyes: Blurred vision  Ear/Nose/Throat : Negative for Ear/Nose/Throat symptoms  Hematologic/Lymphatic: Negative for Hematologic/Lymphatic symptoms  Cardiovascular : Leg swelling  Respiratory : Negative for respiratory symptoms  Endocrine: Negative for endocrine symptoms  Musculoskeletal: Negative for musculoskeletal symptoms  Neurological: Negative for neurological symptoms  Psychologic: Negative for psychiatric symptoms

## 2021-04-24 NOTE — Patient Instructions (Signed)

## 2021-04-28 ENCOUNTER — Other Ambulatory Visit: Payer: Self-pay | Admitting: Family Medicine

## 2021-04-28 DIAGNOSIS — E7849 Other hyperlipidemia: Secondary | ICD-10-CM

## 2021-05-06 ENCOUNTER — Telehealth: Payer: Self-pay | Admitting: Family Medicine

## 2021-05-06 DIAGNOSIS — Z79899 Other long term (current) drug therapy: Secondary | ICD-10-CM

## 2021-05-06 DIAGNOSIS — E7849 Other hyperlipidemia: Secondary | ICD-10-CM

## 2021-05-06 DIAGNOSIS — E119 Type 2 diabetes mellitus without complications: Secondary | ICD-10-CM

## 2021-05-06 DIAGNOSIS — I10 Essential (primary) hypertension: Secondary | ICD-10-CM

## 2021-05-06 NOTE — Telephone Encounter (Signed)
Patient will need a follow-up visit regarding diabetes this fall To do A1c, lipid, met 7, liver, urine ACR Diabetes hypertension hyperlipidemia

## 2021-05-07 ENCOUNTER — Other Ambulatory Visit: Payer: Self-pay | Admitting: *Deleted

## 2021-05-07 DIAGNOSIS — E7849 Other hyperlipidemia: Secondary | ICD-10-CM

## 2021-05-07 MED ORDER — LISINOPRIL 10 MG PO TABS: 10.0000 mg | ORAL_TABLET | Freq: Every day | ORAL | 0 refills | Status: DC

## 2021-05-07 MED ORDER — ATORVASTATIN CALCIUM 10 MG PO TABS: 10.0000 mg | ORAL_TABLET | Freq: Every day | ORAL | 0 refills | Status: DC

## 2021-05-07 NOTE — Telephone Encounter (Signed)
Lab orders placed and mailed to patient with note to schedule follow up appt in the fall.

## 2021-05-23 ENCOUNTER — Other Ambulatory Visit: Payer: Self-pay | Admitting: Family Medicine

## 2021-05-24 IMAGING — CT CT ABD-PEL WO/W CM
3 of 8 series · 12 of 46 positions shown, 18 images · IV contrast (omnipaque)
Comparison: None.

CLINICAL DATA: Staging prostate cancer.

EXAM:
CT ABDOMEN AND PELVIS WITHOUT AND WITH CONTRAST
TECHNIQUE: Multidetector CT imaging of the abdomen and pelvis was performed
following the standard protocol before and following the bolus
administration of intravenous contrast.
CONTRAST:  80mL OMNIPAQUE IOHEXOL 300 MG/ML  SOLN

[Series 2: axial st · axial · 0.96mm/px · z∈[-485,-50]mm · 7 of 117 slices shown, 12 images (1 of 2)]
[im 15/117  soft-tissue]
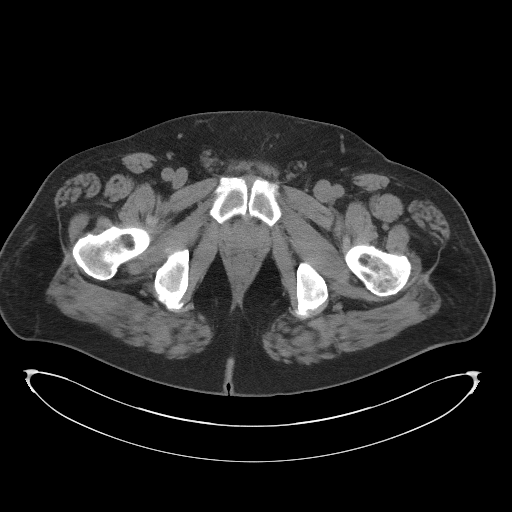
[im 15/117  bone]
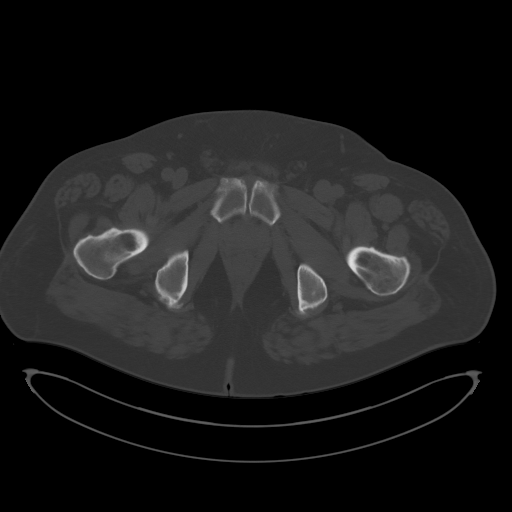
[im 30/117  soft-tissue]
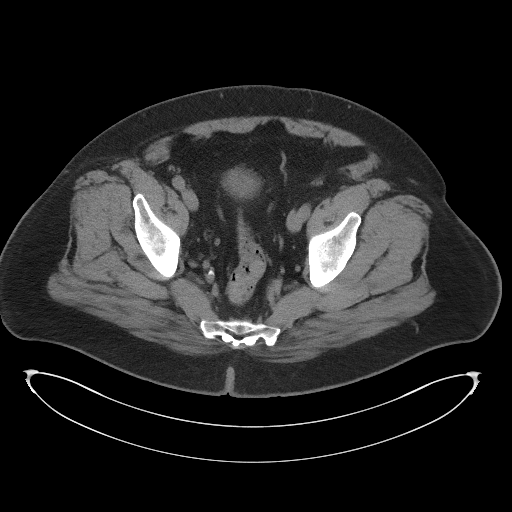
[im 44/117  soft-tissue]
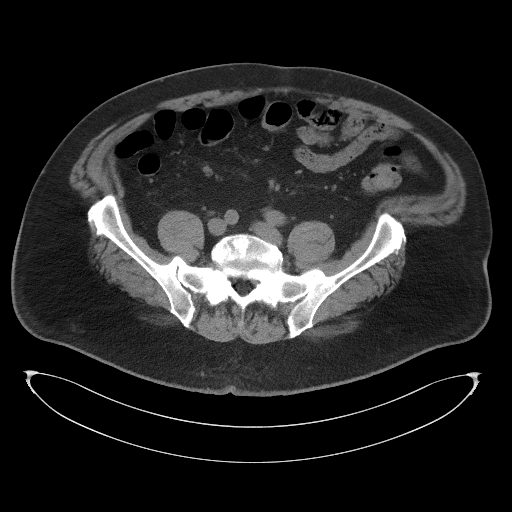
[im 59/117  soft-tissue]
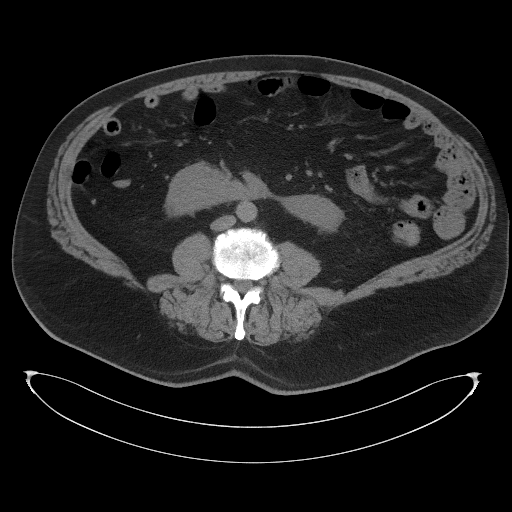
[im 59/117  lung]
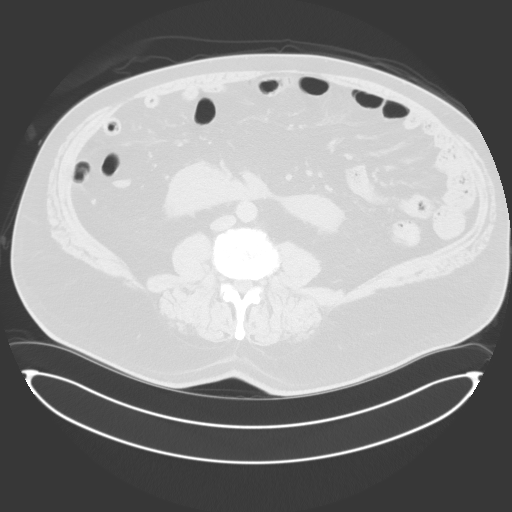
[im 73/117  soft-tissue]
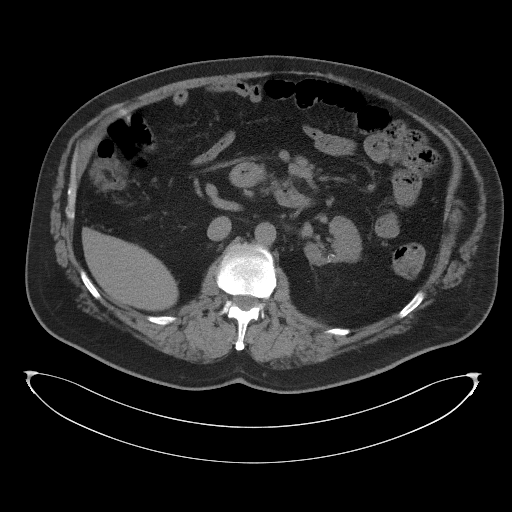
[im 73/117  lung]
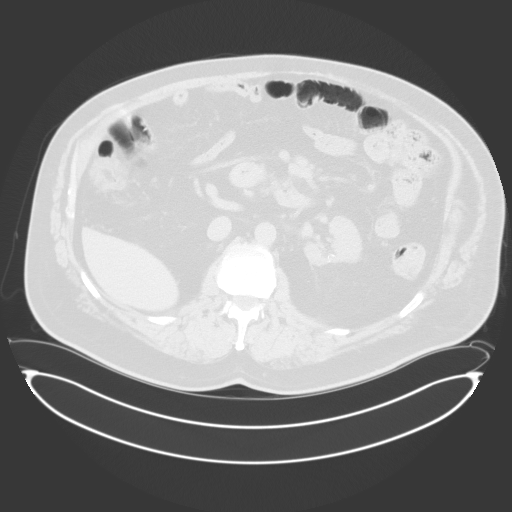
[im 88/117  soft-tissue]
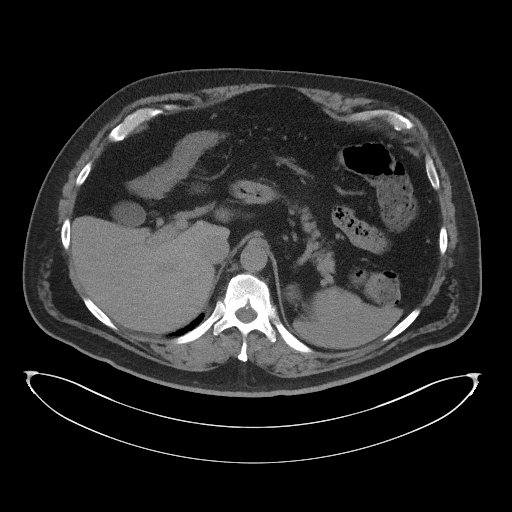
[im 88/117  lung]
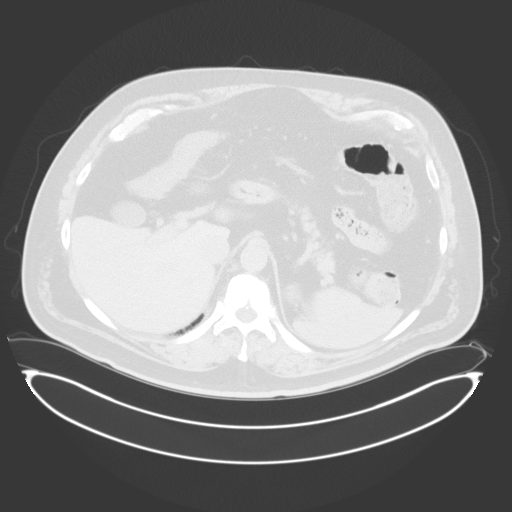
[im 102/117  soft-tissue]
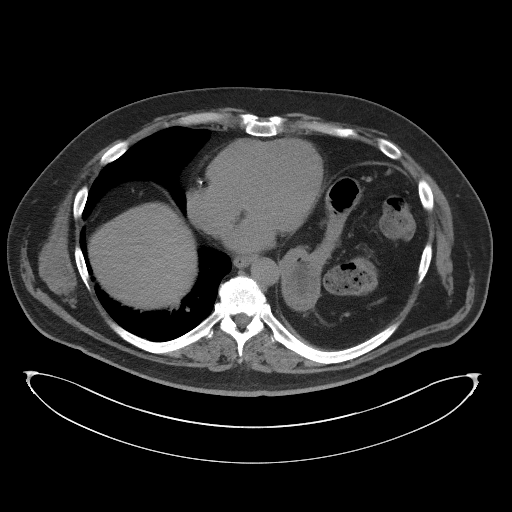
[im 102/117  lung]
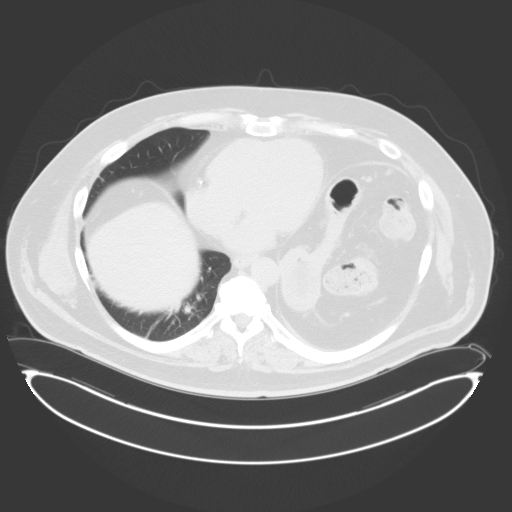

[Series 7: axial st · axial · 0.96mm/px · z∈[-485,-410]mm · 2 of 117 slices shown (2 of 2)]
[im 15/117  soft-tissue]
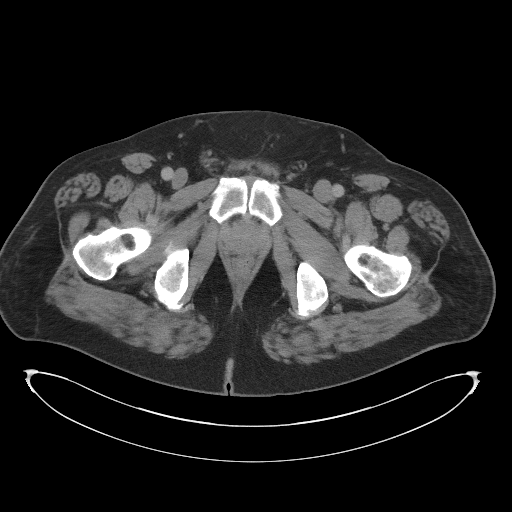
[im 30/117  soft-tissue]
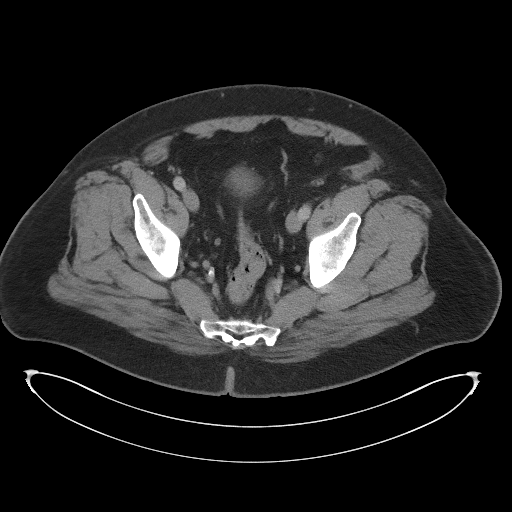

[Series 10: coronal st · coronal · 1.01mm/px · 3 of 135 slices shown, 4 images]
[im 34/135  soft-tissue]
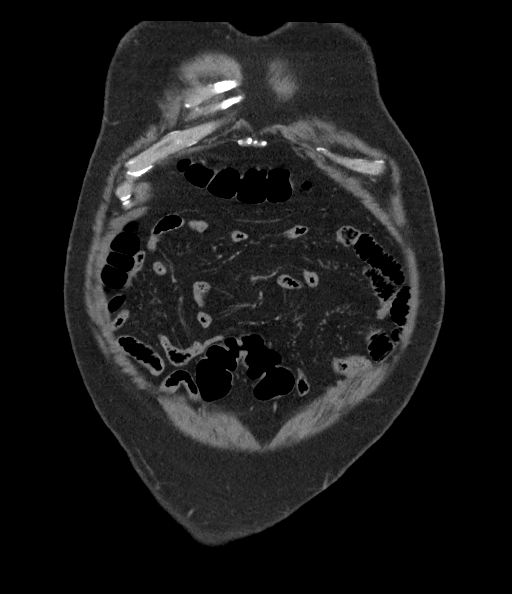
[im 68/135  soft-tissue]
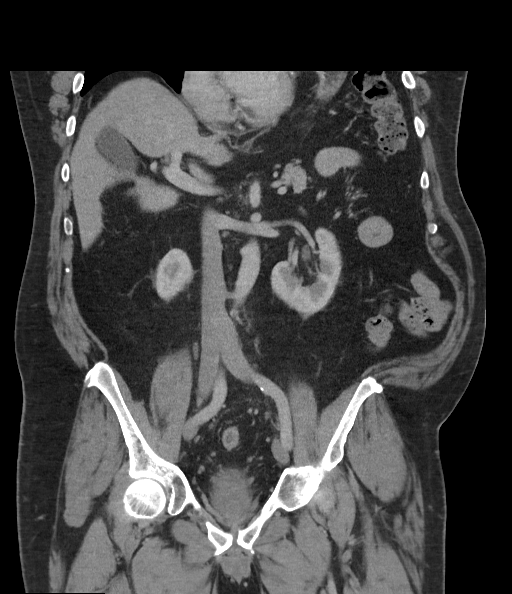
[im 68/135  bone]
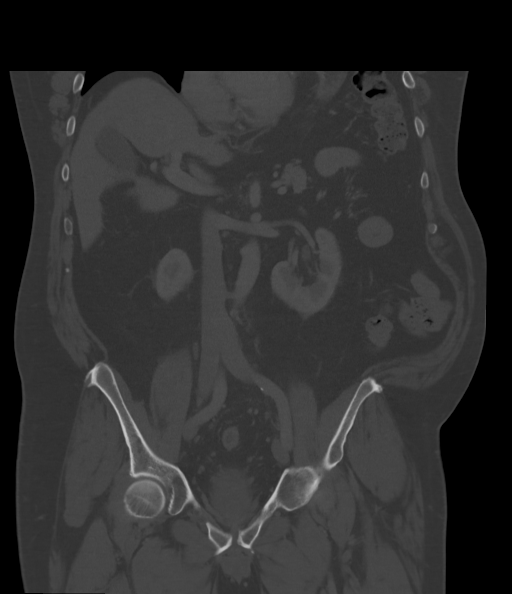
[im 101/135  soft-tissue]
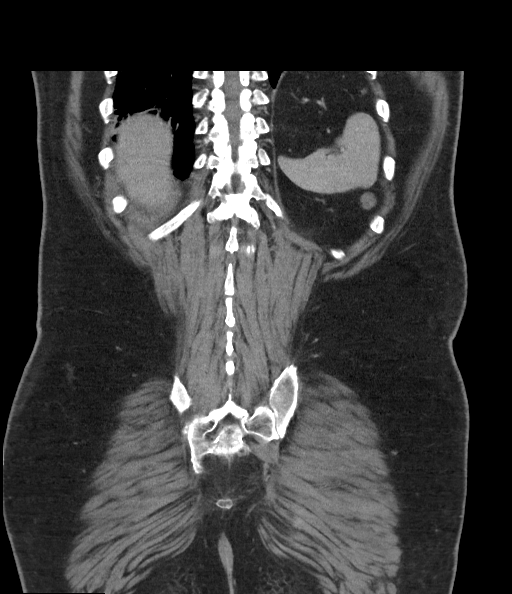

[12 of 46 positions shown; findings below may reference images not displayed]

FINDINGS: Lower chest: Volume loss and scarring identified within the left
lower lobe. 3 mm left lower lobe lung nodule is identified which is
unchanged from chest CT dated 10/16/2015.

Hepatobiliary: Calcified granuloma noted within the posterior dome
of liver. No suspicious liver abnormalities. The gallbladder is
unremarkable. No biliary ductal dilatation.

Pancreas: Unremarkable. No pancreatic ductal dilatation or
surrounding inflammatory changes.

Spleen: Normal in size without focal abnormality.

Adrenals/Urinary Tract: Normal adrenal glands.

Horseshoe configured kidney is identified. Scarring is noted within
the upper pole of the left kidney. No hydronephrosis identified.
Urinary bladder normal.

Stomach/Bowel: Stomach is within normal limits. Appendix appears
normal. No evidence of bowel wall thickening, distention, or
inflammatory changes.

Vascular/Lymphatic: Mild aortic atherosclerosis. No enlarged upper
abdominal lymph nodes. 7 mm right common iliac lymph node
identified. No pelvic or inguinal adenopathy.

Reproductive: Prostate gland measures 3.6 x 4.2 by 5.5 cm (volume =
44 cm^3)

Other: No abdominal wall hernia or abnormality. No abdominopelvic
ascites.

Musculoskeletal: Chronic left posterior tenth rib deformity. There
is focal area of increased sclerosis within the anterior aspect of
the L5 vertebral body, image 74/7.
IMPRESSION: 1. No adenopathy or evidence of solid organ metastasis within the
abdomen or pelvis
2. Subtle indeterminate area of increased sclerosis within the L5
vertebral body. Attention in this area on pending bone scan.
3. Horseshoe kidney.

## 2021-05-31 ENCOUNTER — Other Ambulatory Visit: Payer: Self-pay | Admitting: Family Medicine

## 2021-06-27 ENCOUNTER — Telehealth: Payer: Self-pay | Admitting: Family Medicine

## 2021-06-30 NOTE — Telephone Encounter (Signed)
Called patient. Mailbox not set up 9/27

## 2021-07-02 ENCOUNTER — Telehealth: Payer: Self-pay | Admitting: Family Medicine

## 2021-07-02 DIAGNOSIS — E7849 Other hyperlipidemia: Secondary | ICD-10-CM

## 2021-07-17 ENCOUNTER — Other Ambulatory Visit: Payer: Medicare HMO

## 2021-07-17 ENCOUNTER — Other Ambulatory Visit: Payer: Self-pay

## 2021-07-17 DIAGNOSIS — C61 Malignant neoplasm of prostate: Secondary | ICD-10-CM | POA: Diagnosis not present

## 2021-07-18 LAB — PSA: Prostate Specific Ag, Serum: <0.1 ng/mL (ref 0.0–4.0)

## 2021-07-20 NOTE — Progress Notes (Signed)
Sent via mail 

## 2021-07-27 ENCOUNTER — Encounter: Payer: Self-pay | Admitting: Urology

## 2021-07-27 ENCOUNTER — Ambulatory Visit (INDEPENDENT_AMBULATORY_CARE_PROVIDER_SITE_OTHER): Payer: Medicare HMO | Admitting: Urology

## 2021-07-27 ENCOUNTER — Other Ambulatory Visit: Payer: Self-pay

## 2021-07-27 VITALS — BP 140/80 | HR 84 | Temp 98.4°F | Wt 292.0 lb

## 2021-07-27 DIAGNOSIS — N3941 Urge incontinence: Secondary | ICD-10-CM

## 2021-07-27 DIAGNOSIS — C61 Malignant neoplasm of prostate: Secondary | ICD-10-CM

## 2021-07-27 LAB — URINALYSIS, ROUTINE W REFLEX MICROSCOPIC
Bilirubin, UA: NEGATIVE
Glucose, UA: NEGATIVE
Ketones, UA: NEGATIVE
Nitrite, UA: NEGATIVE
Specific Gravity, UA: 1.02 (ref 1.005–1.030)
Urobilinogen, Ur: 0.2 mg/dL (ref 0.2–1.0)
pH, UA: 6 (ref 5.0–7.5)

## 2021-07-27 LAB — MICROSCOPIC EXAMINATION
Bacteria, UA: NONE SEEN
Renal Epithel, UA: NONE SEEN /hpf

## 2021-07-27 NOTE — Patient Instructions (Signed)

## 2021-07-27 NOTE — Progress Notes (Signed)

## 2021-07-27 NOTE — Addendum Note (Signed)
Addended by: Tyrone Apple on: 07/27/2021 08:53 AM   Modules accepted: Orders

## 2021-07-27 NOTE — Progress Notes (Signed)
07/27/2021 8:30 AM   Adam Kanner Jr. 22-May-1956 [id number]  Referring provider: Kathyrn Drown, MD [place],  [place] [address]  Followup prostate cancer and Urge incontinence   HPI: Mr Haugen is a 65yo here for followup for prostate cancer and urge incontinence. PSA undetectable. He continues to have urinary urgency and occasional urge incontinence. He has tried and failed mirabegron, gemtesa, and toviaz. He has nocturia 2-3x. He is unhappy with his urination. No hx of UTI.    PMH: Past Medical History:  Diagnosis Date   Anginal pain ([place]) 2019   Diabetes mellitus without complication ([place])    Dysrhythmia    stress related. fast HR   History of stress test 2014   intermediate risk   Hyperlipemia    Hypertension    Prostate cancer [facility])     Surgical History: Past Surgical History:  Procedure Laterality Date   COLONOSCOPY N/A 01/03/2020   Procedure: COLONOSCOPY;  Surgeon: Rogene Houston, MD;  [facility];  Service: Endoscopy;  Laterality: N/A;  [place] Left    2000, Dr. Aviva Signs   LYMPHADENECTOMY Bilateral 01/23/2020   Procedure: LYMPHADENECTOMY;  Surgeon: Alexis Frock, MD;  [facility];  Service: Urology;  Laterality: Bilateral;   POLYPECTOMY  01/03/2020   Procedure: POLYPECTOMY;  Surgeon: Rogene Houston, MD;  [facility];  Service: Endoscopy;;   ROBOT ASSISTED LAPAROSCOPIC RADICAL PROSTATECTOMY N/A 01/23/2020   Procedure: XI ROBOTIC ASSISTED LAPAROSCOPIC RADICAL PROSTATECTOMY;  Surgeon: Alexis Frock, MD;  [facility];  Service: Urology;  Laterality: N/A;  3 HRS    Home Medications:  Allergies as of 07/27/2021   No Known Allergies      Medication List        Accurate as of July 27, 2021  8:30 AM. If you have any questions, ask your nurse or doctor.          aspirin 81 MG chewable tablet Chew by mouth daily.   atorvastatin 10 MG tablet Commonly known as: LIPITOR TAKE 1  TABLET BY MOUTH EVERY DAY   blood glucose meter kit and supplies Dispense based on patient and insurance preference. Use to check sugars once a day (FOR ICD-10 E10.9, E11.9).   fesoterodine 8 MG Tb24 tablet Commonly known as: TOVIAZ Take 1 tablet (8 mg total) by mouth daily.   Gemtesa 75 MG Tabs Generic drug: Vibegron Take 1 capsule by mouth daily.   HYDROcodone-acetaminophen 5-325 MG tablet Commonly known as: Norco Take 1-2 tablets by mouth every 6 (six) hours as needed for moderate pain.   lisinopril 10 MG tablet Commonly known as: ZESTRIL Take 1 tablet (10 mg total) by mouth daily.   metFORMIN 500 MG tablet Commonly known as: GLUCOPHAGE TAKE 2 TABLETS BY MOUTH TWICE A DAY   mirabegron ER 25 MG Tb24 tablet Commonly known as: MYRBETRIQ Take 1 tablet (25 mg total) by mouth daily.   nitroGLYCERIN 0.4 MG SL tablet Commonly known as: NITROSTAT Place 1 tablet (0.4 mg total) under the tongue every 5 (five) minutes as needed for chest pain.   OneTouch Delica Plus [id number] Misc USE TO OBTAIN A BLOOD SPECIMEN TO TEST BLOOD SUGAR DAILY   OneTouch Ultra test strip Generic drug: glucose blood USE TO TEST BLOOD SUGAR DAILY   traMADol 50 MG tablet Commonly known as: ULTRAM Take 1 tablet by mouth every 6 (six) hours as needed.        Allergies:  No Known Allergies  Family History: Family History  Problem Relation Age of Onset   Diabetes Father    Heart disease Mother    Cancer Other    Breast cancer Neg Hx    Prostate cancer Neg Hx    Colon cancer Neg Hx    Pancreatic cancer Neg Hx     Social History:  reports that he has never smoked. He has never used smokeless tobacco. He reports that he does not drink alcohol and does not use drugs.  ROS: All other review of systems were reviewed and are negative except what is noted above in HPI  Physical Exam: BP 140/80   Pulse 84   Temp 98.4 F (36.9 C)   Wt 292 lb (132.5 kg)   BMI 37.49 kg/m   Constitutional:   Alert and oriented, No acute distress. HEENT: [place] AT, moist mucus membranes.  Trachea midline, no masses. Cardiovascular: No clubbing, cyanosis, or edema. Respiratory: Normal respiratory effort, no increased work of breathing. GI: Abdomen is soft, nontender, nondistended, no abdominal masses GU: No CVA tenderness.  Lymph: No cervical or inguinal lymphadenopathy. Skin: No rashes, bruises or suspicious lesions. Neurologic: Grossly intact, no focal deficits, moving all 4 extremities. Psychiatric: Normal mood and affect.  Laboratory Data: Lab Results  Component Value Date   WBC 7.1 01/15/2020   HGB 13.9 01/24/2020   HCT 43.1 01/24/2020   MCV 92.1 01/15/2020   PLT 208 01/15/2020    Lab Results  Component Value Date   CREATININE 1.53 (H) 01/14/2021    Lab Results  Component Value Date   PSA <0.1 03/04/2020   PSA 2.3 12/06/2014    Lab Results  Component Value Date   TESTOSTERONE 594 04/17/2021    Lab Results  Component Value Date   HGBA1C 7.0 (H) 01/14/2021    Urinalysis    Component Value Date/Time   APPEARANCEUR Clear 04/24/2021 0959   GLUCOSEU Negative 04/24/2021 0959   BILIRUBINUR Negative 04/24/2021 0959   PROTEINUR Trace (A) 04/24/2021 0959   NITRITE Negative 04/24/2021 0959   LEUKOCYTESUR Negative 04/24/2021 0959    Lab Results  Component Value Date   LABMICR See below: 04/24/2021   WBCUA None seen 04/24/2021   LABEPIT None seen 04/24/2021   BACTERIA None seen 04/24/2021    Pertinent Imaging:  No results found for this or any previous visit.  No results found for this or any previous visit.  No results found for this or any previous visit.  No results found for this or any previous visit.  No results found for this or any previous visit.  No results found for this or any previous visit.  No results found for this or any previous visit.  No results found for this or any previous visit.   Assessment & Plan:    1. Prostate cancer  ([place]) -RTC 6 months with PSA  2. Urge incontinence -We discussed PTNS, intravesical botox, and Interstim.    No follow-ups on file.  Nicolette Bang, MD  [facility] [place]

## 2021-08-05 NOTE — Telephone Encounter (Signed)
Second message mailbox not set up cant get through 08/05/21

## 2021-08-06 ENCOUNTER — Telehealth: Payer: Self-pay

## 2021-08-06 NOTE — Telephone Encounter (Signed)
Patient called and made aware.

## 2021-08-06 NOTE — Telephone Encounter (Signed)
Patient called stating he notice some blood in his urine. Patient states he is having no other symptoms. Patient states it has only happened one time. Patient informed to monitor and if it happens again to call office. Patient voiced understanding.

## 2021-08-09 ENCOUNTER — Encounter: Payer: Self-pay | Admitting: Family Medicine

## 2021-08-18 ENCOUNTER — Other Ambulatory Visit: Payer: Self-pay | Admitting: Family Medicine

## 2021-08-18 DIAGNOSIS — E7849 Other hyperlipidemia: Secondary | ICD-10-CM

## 2021-08-24 ENCOUNTER — Other Ambulatory Visit: Payer: Self-pay

## 2021-08-24 ENCOUNTER — Ambulatory Visit (INDEPENDENT_AMBULATORY_CARE_PROVIDER_SITE_OTHER): Payer: Medicare HMO

## 2021-08-24 DIAGNOSIS — N3941 Urge incontinence: Secondary | ICD-10-CM | POA: Diagnosis not present

## 2021-08-24 NOTE — Progress Notes (Signed)
PTNS  Session # 1 of 45  Health & Social Factors: diabetic Caffeine: none  Alcohol: none Daytime voids #per day: 3-4 Night-time voids #per night: 3-4 Urgency: none Incontinence Episodes #per day: ? Ankle used: left Treatment Setting: 7 Feeling/ Response: positive sensation  Comments: n/a  Performed By: [facility] LPN  Follow Up: Keep next scheduled PTNS

## 2021-08-31 ENCOUNTER — Ambulatory Visit: Payer: Medicare HMO | Admitting: Urology

## 2021-08-31 ENCOUNTER — Other Ambulatory Visit: Payer: Self-pay

## 2021-08-31 ENCOUNTER — Encounter: Payer: Self-pay | Admitting: Urology

## 2021-08-31 ENCOUNTER — Ambulatory Visit: Payer: Medicare HMO

## 2021-08-31 ENCOUNTER — Other Ambulatory Visit: Payer: Self-pay | Admitting: Family Medicine

## 2021-08-31 VITALS — BP 115/71 | HR 98 | Temp 98.5°F

## 2021-08-31 DIAGNOSIS — R310 Gross hematuria: Secondary | ICD-10-CM

## 2021-08-31 DIAGNOSIS — N3941 Urge incontinence: Secondary | ICD-10-CM

## 2021-08-31 DIAGNOSIS — R350 Frequency of micturition: Secondary | ICD-10-CM

## 2021-08-31 LAB — MICROSCOPIC EXAMINATION
RBC, Urine: >30 /hpf — AB (ref 0–2)
Renal Epithel, UA: NONE SEEN /hpf
WBC, UA: >30 /hpf — AB (ref 0–5)

## 2021-08-31 LAB — URINALYSIS, ROUTINE W REFLEX MICROSCOPIC
Bilirubin, UA: NEGATIVE
Glucose, UA: NEGATIVE
Ketones, UA: NEGATIVE
Nitrite, UA: POSITIVE — AB
Specific Gravity, UA: >1.03 — ABNORMAL HIGH (ref 1.005–1.030)
Urobilinogen, Ur: 0.2 mg/dL (ref 0.2–1.0)
pH, UA: 6 (ref 5.0–7.5)

## 2021-08-31 MED ORDER — NITROFURANTOIN MONOHYD MACRO 100 MG PO CAPS: 100.0000 mg | ORAL_CAPSULE | Freq: Two times a day (BID) | ORAL | 0 refills | Status: DC

## 2021-08-31 NOTE — Progress Notes (Signed)
Urological Symptom Review  Patient is experiencing the following symptoms: Frequent urination Get up at night to urinate Leakage of urine Blood in urine   Review of Systems  Gastrointestinal (upper)  : Negative for upper GI symptoms  Gastrointestinal (lower) : Negative for lower GI symptoms  Constitutional : Negative for symptoms  Skin: Negative for skin symptoms  Eyes: Blurred vision  Ear/Nose/Throat : Negative for Ear/Nose/Throat symptoms  Hematologic/Lymphatic: Negative for Hematologic/Lymphatic symptoms  Cardiovascular : Negative for cardiovascular symptoms  Respiratory : Negative for respiratory symptoms  Endocrine: Negative for endocrine symptoms  Musculoskeletal: Negative for musculoskeletal symptoms  Neurological: Negative for neurological symptoms  Psychologic: Negative for psychiatric symptoms

## 2021-08-31 NOTE — H&P (View-Only) (Signed)
08/31/2021 2:21 PM   [place]. 1955/11/18 [id number]  Referring provider: Kathyrn Drown, MD [address] [place],  [place] [address]  Gross hematuria   HPI: Mr Lazenby is a 65yo here with new onset gross hematuria and pelvic pain. Starting 5 days ago he developed worsening LUTS. Her has dysuria, urinary frequency, nocturia 5-6x. UA is concerning for infection.    PMH: Past Medical History:  Diagnosis Date   Anginal pain ([place]) 2019   Diabetes mellitus without complication ([place])    Dysrhythmia    stress related. fast HR   History of stress test 2014   intermediate risk   Hyperlipemia    Hypertension    Prostate cancer [facility])     Surgical History: Past Surgical History:  Procedure Laterality Date   COLONOSCOPY N/A 01/03/2020   Procedure: COLONOSCOPY;  Surgeon: Rogene Houston, MD;  [facility];  Service: Endoscopy;  Laterality: N/A;  [place] Left    2000, Dr. Aviva Signs   LYMPHADENECTOMY Bilateral 01/23/2020   Procedure: LYMPHADENECTOMY;  Surgeon: Alexis Frock, MD;  [facility];  Service: Urology;  Laterality: Bilateral;   POLYPECTOMY  01/03/2020   Procedure: POLYPECTOMY;  Surgeon: Rogene Houston, MD;  [facility];  Service: Endoscopy;;   ROBOT ASSISTED LAPAROSCOPIC RADICAL PROSTATECTOMY N/A 01/23/2020   Procedure: XI ROBOTIC ASSISTED LAPAROSCOPIC RADICAL PROSTATECTOMY;  Surgeon: Alexis Frock, MD;  [facility];  Service: Urology;  Laterality: N/A;  3 HRS    Home Medications:  Allergies as of 08/31/2021   No Known Allergies      Medication List        Accurate as of August 31, 2021  2:21 PM. If you have any questions, ask your nurse or doctor.          aspirin 81 MG chewable tablet Chew by mouth daily.   atorvastatin 10 MG tablet Commonly known as: LIPITOR TAKE 1 TABLET BY MOUTH EVERY DAY   blood glucose meter kit and supplies Dispense based on patient and insurance preference. Use  to check sugars once a day (FOR ICD-10 E10.9, E11.9).   fesoterodine 8 MG Tb24 tablet Commonly known as: TOVIAZ Take 1 tablet (8 mg total) by mouth daily.   Gemtesa 75 MG Tabs Generic drug: Vibegron Take 1 capsule by mouth daily.   HYDROcodone-acetaminophen 5-325 MG tablet Commonly known as: Norco Take 1-2 tablets by mouth every 6 (six) hours as needed for moderate pain.   lisinopril 10 MG tablet Commonly known as: ZESTRIL Take 1 tablet (10 mg total) by mouth daily.   metFORMIN 500 MG tablet Commonly known as: GLUCOPHAGE TAKE 2 TABLETS BY MOUTH TWICE A DAY   mirabegron ER 25 MG Tb24 tablet Commonly known as: MYRBETRIQ Take 1 tablet (25 mg total) by mouth daily.   nitroGLYCERIN 0.4 MG SL tablet Commonly known as: NITROSTAT Place 1 tablet (0.4 mg total) under the tongue every 5 (five) minutes as needed for chest pain.   OneTouch Delica Plus [id number] Misc USE TO OBTAIN A BLOOD SPECIMEN TO TEST BLOOD SUGAR DAILY   OneTouch Ultra test strip Generic drug: glucose blood USE TO TEST BLOOD SUGAR DAILY   traMADol 50 MG tablet Commonly known as: ULTRAM Take 1 tablet by mouth every 6 (six) hours as needed.        Allergies: No Known Allergies  Family History: Family History  Problem Relation Age of Onset   Diabetes Father  Heart disease Mother    Cancer Other    Breast cancer Neg Hx    Prostate cancer Neg Hx    Colon cancer Neg Hx    Pancreatic cancer Neg Hx     Social History:  reports that he has never smoked. He has never used smokeless tobacco. He reports that he does not drink alcohol and does not use drugs.  ROS: All other review of systems were reviewed and are negative except what is noted above in HPI  Physical Exam: BP 115/71    Pulse 98    Temp 98.5 F (36.9 C)   Constitutional:  Alert and oriented, No acute distress. HEENT: [place] AT, moist mucus membranes.  Trachea midline, no masses. Cardiovascular: No clubbing, cyanosis, or edema. Respiratory:  Normal respiratory effort, no increased work of breathing. GI: Abdomen is soft, nontender, nondistended, no abdominal masses GU: No CVA tenderness.  Lymph: No cervical or inguinal lymphadenopathy. Skin: No rashes, bruises or suspicious lesions. Neurologic: Grossly intact, no focal deficits, moving all 4 extremities. Psychiatric: Normal mood and affect.  Laboratory Data: Lab Results  Component Value Date   WBC 7.1 01/15/2020   HGB 13.9 01/24/2020   HCT 43.1 01/24/2020   MCV 92.1 01/15/2020   PLT 208 01/15/2020    Lab Results  Component Value Date   CREATININE 1.53 (H) 01/14/2021    Lab Results  Component Value Date   PSA <0.1 03/04/2020   PSA 2.3 12/06/2014    Lab Results  Component Value Date   TESTOSTERONE 594 04/17/2021    Lab Results  Component Value Date   HGBA1C 7.0 (H) 01/14/2021    Urinalysis    Component Value Date/Time   APPEARANCEUR Clear 07/27/2021 0857   GLUCOSEU Negative 07/27/2021 0857   BILIRUBINUR Negative 07/27/2021 0857   PROTEINUR Trace (A) 07/27/2021 0857   NITRITE Negative 07/27/2021 0857   LEUKOCYTESUR 1+ (A) 07/27/2021 0857    Lab Results  Component Value Date   LABMICR See below: 07/27/2021   WBCUA 11-30 (A) 07/27/2021   LABEPIT 0-10 07/27/2021   BACTERIA None seen 07/27/2021    Pertinent Imaging:  No results found for this or any previous visit.  No results found for this or any previous visit.  No results found for this or any previous visit.  No results found for this or any previous visit.  No results found for this or any previous visit.  No results found for this or any previous visit.  No results found for this or any previous visit.  No results found for this or any previous visit.   Assessment & Plan:    1. Urge incontinence -Continue PTNS - Urinalysis, Routine w reflex microscopic  2. Urine frequency -likely related to UTI - Urinalysis, Routine w reflex microscopic  3. Gross hematuria -Likely  related to UTI. Urine for culture. Macrobid 179m BID for 7 days   No follow-ups on file.  PNicolette Bang MD  [facility] Hospital[facility] [place]

## 2021-08-31 NOTE — Progress Notes (Signed)
08/31/2021 2:21 PM   [place]. 24-Mar-1956 [id number]  Referring provider: Kathyrn Drown, MD [address] [place],  [place] [address]  Gross hematuria   HPI: Adam Lee is a 65yo here with new onset gross hematuria and pelvic pain. Starting 5 days ago he developed worsening LUTS. Her has dysuria, urinary frequency, nocturia 5-6x. UA is concerning for infection.    PMH: Past Medical History:  Diagnosis Date   Anginal pain ([place]) 2019   Diabetes mellitus without complication ([place])    Dysrhythmia    stress related. fast HR   History of stress test 2014   intermediate risk   Hyperlipemia    Hypertension    Prostate cancer [facility])     Surgical History: Past Surgical History:  Procedure Laterality Date   COLONOSCOPY N/A 01/03/2020   Procedure: COLONOSCOPY;  Surgeon: Rogene Houston, MD;  [facility];  Service: Endoscopy;  Laterality: N/A;  [place] Left    2000, Dr. Aviva Signs   LYMPHADENECTOMY Bilateral 01/23/2020   Procedure: LYMPHADENECTOMY;  Surgeon: Alexis Frock, MD;  Location: [facility];  Service: Urology;  Laterality: Bilateral;   POLYPECTOMY  01/03/2020   Procedure: POLYPECTOMY;  Surgeon: Rogene Houston, MD;  [facility];  Service: Endoscopy;;   ROBOT ASSISTED LAPAROSCOPIC RADICAL PROSTATECTOMY N/A 01/23/2020   Procedure: XI ROBOTIC ASSISTED LAPAROSCOPIC RADICAL PROSTATECTOMY;  Surgeon: Alexis Frock, MD;  [facility];  Service: Urology;  Laterality: N/A;  3 HRS    Home Medications:  Allergies as of 08/31/2021   No Known Allergies      Medication List        Accurate as of August 31, 2021  2:21 PM. If you have any questions, ask your nurse or doctor.          aspirin 81 MG chewable tablet Chew by mouth daily.   atorvastatin 10 MG tablet Commonly known as: LIPITOR TAKE 1 TABLET BY MOUTH EVERY DAY   blood glucose meter kit and supplies Dispense based on patient and insurance preference. Use  to check sugars once a day (FOR ICD-10 E10.9, E11.9).   fesoterodine 8 MG Tb24 tablet Commonly known as: TOVIAZ Take 1 tablet (8 mg total) by mouth daily.   Gemtesa 75 MG Tabs Generic drug: Vibegron Take 1 capsule by mouth daily.   HYDROcodone-acetaminophen 5-325 MG tablet Commonly known as: Norco Take 1-2 tablets by mouth every 6 (six) hours as needed for moderate pain.   lisinopril 10 MG tablet Commonly known as: ZESTRIL Take 1 tablet (10 mg total) by mouth daily.   metFORMIN 500 MG tablet Commonly known as: GLUCOPHAGE TAKE 2 TABLETS BY MOUTH TWICE A DAY   mirabegron ER 25 MG Tb24 tablet Commonly known as: MYRBETRIQ Take 1 tablet (25 mg total) by mouth daily.   nitroGLYCERIN 0.4 MG SL tablet Commonly known as: NITROSTAT Place 1 tablet (0.4 mg total) under the tongue every 5 (five) minutes as needed for chest pain.   OneTouch Delica Plus [id number] Misc USE TO OBTAIN A BLOOD SPECIMEN TO TEST BLOOD SUGAR DAILY   OneTouch Ultra test strip Generic drug: glucose blood USE TO TEST BLOOD SUGAR DAILY   traMADol 50 MG tablet Commonly known as: ULTRAM Take 1 tablet by mouth every 6 (six) hours as needed.        Allergies: No Known Allergies  Family History: Family History  Problem Relation Age of Onset   Diabetes Father  Heart disease Mother    Cancer Other    Breast cancer Neg Hx    Prostate cancer Neg Hx    Colon cancer Neg Hx    Pancreatic cancer Neg Hx     Social History:  reports that he has never smoked. He has never used smokeless tobacco. He reports that he does not drink alcohol and does not use drugs.  ROS: All other review of systems were reviewed and are negative except what is noted above in HPI  Physical Exam: BP 115/71   Pulse 98   Temp 98.5 F (36.9 C)   Constitutional:  Alert and oriented, No acute distress. HEENT: [place] AT, moist mucus membranes.  Trachea midline, no masses. Cardiovascular: No clubbing, cyanosis, or edema. Respiratory:  Normal respiratory effort, no increased work of breathing. GI: Abdomen is soft, nontender, nondistended, no abdominal masses GU: No CVA tenderness.  Lymph: No cervical or inguinal lymphadenopathy. Skin: No rashes, bruises or suspicious lesions. Neurologic: Grossly intact, no focal deficits, moving all 4 extremities. Psychiatric: Normal mood and affect.  Laboratory Data: Lab Results  Component Value Date   WBC 7.1 01/15/2020   HGB 13.9 01/24/2020   HCT 43.1 01/24/2020   MCV 92.1 01/15/2020   PLT 208 01/15/2020    Lab Results  Component Value Date   CREATININE 1.53 (H) 01/14/2021    Lab Results  Component Value Date   PSA <0.1 03/04/2020   PSA 2.3 12/06/2014    Lab Results  Component Value Date   TESTOSTERONE 594 04/17/2021    Lab Results  Component Value Date   HGBA1C 7.0 (H) 01/14/2021    Urinalysis    Component Value Date/Time   APPEARANCEUR Clear 07/27/2021 0857   GLUCOSEU Negative 07/27/2021 0857   BILIRUBINUR Negative 07/27/2021 0857   PROTEINUR Trace (A) 07/27/2021 0857   NITRITE Negative 07/27/2021 0857   LEUKOCYTESUR 1+ (A) 07/27/2021 0857    Lab Results  Component Value Date   LABMICR See below: 07/27/2021   WBCUA 11-30 (A) 07/27/2021   LABEPIT 0-10 07/27/2021   BACTERIA None seen 07/27/2021    Pertinent Imaging:  No results found for this or any previous visit.  No results found for this or any previous visit.  No results found for this or any previous visit.  No results found for this or any previous visit.  No results found for this or any previous visit.  No results found for this or any previous visit.  No results found for this or any previous visit.  No results found for this or any previous visit.   Assessment & Plan:    1. Urge incontinence -Continue PTNS - Urinalysis, Routine w reflex microscopic  2. Urine frequency -likely related to UTI - Urinalysis, Routine w reflex microscopic  3. Gross hematuria -Likely  related to UTI. Urine for culture. Macrobid 12m BID for 7 days   No follow-ups on file.  PNicolette Bang MD  [facility] [place]

## 2021-09-04 ENCOUNTER — Other Ambulatory Visit: Payer: Self-pay

## 2021-09-04 ENCOUNTER — Ambulatory Visit (INDEPENDENT_AMBULATORY_CARE_PROVIDER_SITE_OTHER): Payer: Medicare HMO

## 2021-09-04 DIAGNOSIS — R350 Frequency of micturition: Secondary | ICD-10-CM

## 2021-09-04 LAB — URINE CULTURE

## 2021-09-04 MED ORDER — SULFAMETHOXAZOLE-TRIMETHOPRIM 800-160 MG PO TABS: 1.0000 | ORAL_TABLET | Freq: Two times a day (BID) | ORAL | 0 refills | Status: DC

## 2021-09-04 NOTE — Progress Notes (Signed)
PTNS  Session # 2 of 12  Health & Social Factors: Diabetes Caffeine: none Alcohol: none  Daytime voids #per day: 4-5 Night-time voids #per night: 3-4 Urgency: none Incontinence Episodes #per day: none Ankle used: left Treatment Setting: 3 Feeling/ Response: tingling Comments: patient recent urine culture shows resistant to the nitrofurantion previously prescribed. Reviewed with Dr. Alyson Ingles. New order for bactrim sent to pharmacy.    Performed By:   Follow Up: 1 week nurse PTNS

## 2021-09-07 ENCOUNTER — Ambulatory Visit: Payer: Medicare HMO

## 2021-09-08 ENCOUNTER — Encounter: Payer: Self-pay | Admitting: Urology

## 2021-09-08 NOTE — Patient Instructions (Signed)
Urinary Tract Infection, Adult °A urinary tract infection (UTI) is an infection of any part of the urinary tract. The urinary tract includes: °The kidneys. °The ureters. °The bladder. °The urethra. °These organs make, store, and get rid of pee (urine) in the body. °What are the causes? °This infection is caused by germs (bacteria) in your genital area. These germs grow and cause swelling (inflammation) of your urinary tract. °What increases the risk? °The following factors may make you more likely to develop this condition: °Using a small, thin tube (catheter) to drain pee. °Not being able to control when you pee or poop (incontinence). °Being male. If you are male, these things can increase the risk: °Using these methods to prevent pregnancy: °A medicine that kills sperm (spermicide). °A device that blocks sperm (diaphragm). °Having low levels of a male hormone (estrogen). °Being pregnant. °You are more likely to develop this condition if: °You have genes that add to your risk. °You are sexually active. °You take antibiotic medicines. °You have trouble peeing because of: °A prostate that is bigger than normal, if you are male. °A blockage in the part of your body that drains pee from the bladder. °A kidney stone. °A nerve condition that affects your bladder. °Not getting enough to drink. °Not peeing often enough. °You have other conditions, such as: °Diabetes. °A weak disease-fighting system (immune system). °Sickle cell disease. °Gout. °Injury of the spine. °What are the signs or symptoms? °Symptoms of this condition include: °Needing to pee right away. °Peeing small amounts often. °Pain or burning when peeing. °Blood in the pee. °Pee that smells bad or not like normal. °Trouble peeing. °Pee that is cloudy. °Fluid coming from the vagina, if you are male. °Pain in the belly or lower back. °Other symptoms include: °Vomiting. °Not feeling hungry. °Feeling mixed up (confused). This may be the first symptom in  older adults. °Being tired and grouchy (irritable). °A fever. °Watery poop (diarrhea). °How is this treated? °Taking antibiotic medicine. °Taking other medicines. °Drinking enough water. °In some cases, you may need to see a specialist. °Follow these instructions at home: °Medicines °Take over-the-counter and prescription medicines only as told by your doctor. °If you were prescribed an antibiotic medicine, take it as told by your doctor. Do not stop taking it even if you start to feel better. °General instructions °Make sure you: °Pee until your bladder is empty. °Do not hold pee for a long time. °Empty your bladder after sex. °Wipe from front to back after peeing or pooping if you are a male. Use each tissue one time when you wipe. °Drink enough fluid to keep your pee pale yellow. °Keep all follow-up visits. °Contact a doctor if: °You do not get better after 1-2 days. °Your symptoms go away and then come back. °Get help right away if: °You have very bad back pain. °You have very bad pain in your lower belly. °You have a fever. °You have chills. °You feeling like you will vomit or you vomit. °Summary °A urinary tract infection (UTI) is an infection of any part of the urinary tract. °This condition is caused by germs in your genital area. °There are many risk factors for a UTI. °Treatment includes antibiotic medicines. °Drink enough fluid to keep your pee pale yellow. °This information is not intended to replace advice given to you by your health care provider. Make sure you discuss any questions you have with your health care provider. °Document Revised: 05/02/2020 Document Reviewed: 05/02/2020 °Elsevier Patient Education © 2022   Elsevier Inc. ° °

## 2021-09-11 ENCOUNTER — Ambulatory Visit (INDEPENDENT_AMBULATORY_CARE_PROVIDER_SITE_OTHER): Payer: Medicare HMO

## 2021-09-11 ENCOUNTER — Other Ambulatory Visit: Payer: Self-pay

## 2021-09-11 ENCOUNTER — Telehealth: Payer: Self-pay

## 2021-09-11 DIAGNOSIS — R350 Frequency of micturition: Secondary | ICD-10-CM | POA: Diagnosis not present

## 2021-09-11 NOTE — Progress Notes (Signed)
PTNS  Session # 3  Health & Social Factors: diabetes Caffeine: yes Alcohol: none Daytime voids #per day: 6 Night-time voids #per night: 3-4 Urgency: yes Incontinence Episodes #per day: none Ankle used: left Treatment Setting: 2 Feeling/ Response: toe curl  Comments: none  Performed By: Estill Bamberg RN  Follow Up: 1 week PTNS

## 2021-09-11 NOTE — Telephone Encounter (Signed)
Patient left a voice message:  Wanting to check if Dr. Alyson Ingles will call in an antibiotic from today's visit.  Please advise.  Thanks, Helene Kelp

## 2021-09-11 NOTE — Telephone Encounter (Signed)
I called and spoke with patient. Patient has had intermittent painless hematuria. Reviewed with Dr. Alyson Ingles, patient will do cysto next week at PTNS visit to determine source of bleeding. Patient verbalized understanding.

## 2021-09-14 ENCOUNTER — Ambulatory Visit: Payer: Medicare HMO

## 2021-09-15 NOTE — Telephone Encounter (Signed)
Ive tried three time no answer.  Cant leave message

## 2021-09-18 ENCOUNTER — Ambulatory Visit (INDEPENDENT_AMBULATORY_CARE_PROVIDER_SITE_OTHER): Payer: Medicare HMO | Admitting: Urology

## 2021-09-18 ENCOUNTER — Other Ambulatory Visit: Payer: Self-pay

## 2021-09-18 ENCOUNTER — Encounter: Payer: Self-pay | Admitting: Urology

## 2021-09-18 VITALS — BP 129/81 | HR 98

## 2021-09-18 DIAGNOSIS — R350 Frequency of micturition: Secondary | ICD-10-CM

## 2021-09-18 DIAGNOSIS — N3941 Urge incontinence: Secondary | ICD-10-CM | POA: Diagnosis not present

## 2021-09-18 DIAGNOSIS — R310 Gross hematuria: Secondary | ICD-10-CM | POA: Diagnosis not present

## 2021-09-18 LAB — URINALYSIS, ROUTINE W REFLEX MICROSCOPIC
Bilirubin, UA: POSITIVE — AB
Nitrite, UA: POSITIVE — AB
Specific Gravity, UA: 1.01 (ref 1.005–1.030)
Urobilinogen, Ur: 4 mg/dL — ABNORMAL HIGH (ref 0.2–1.0)
pH, UA: 8.5 — ABNORMAL HIGH (ref 5.0–7.5)

## 2021-09-18 MED ORDER — CEFUROXIME AXETIL 500 MG PO TABS: 500.0000 mg | ORAL_TABLET | Freq: Two times a day (BID) | ORAL | 0 refills | Status: DC

## 2021-09-18 NOTE — Patient Instructions (Signed)

## 2021-09-18 NOTE — Progress Notes (Signed)
PTNS  Session # 4 of 45  Health & Social Factors: diabetic Caffeine: 2 cups Alcohol: none Daytime voids #per day: 4 Night-time voids #per night: 2 Urgency: no Incontinence Episodes #per day: only at night Ankle used: left Treatment Setting: 3 Feeling/ Response: positive sensation  Comments: n/a  Performed By: [facility] LPN  Follow Up: keep next scheduled [place]

## 2021-09-18 NOTE — Progress Notes (Signed)
Urological Symptom Review  Patient is experiencing the following symptoms: Frequent urination Blood in urine   Review of Systems  Gastrointestinal (upper)  : Negative for upper GI symptoms  Gastrointestinal (lower) : Negative for lower GI symptoms  Constitutional : Negative for symptoms  Skin: Negative for skin symptoms  Eyes: Negative for eye symptoms  Ear/Nose/Throat : Negative for Ear/Nose/Throat symptoms  Hematologic/Lymphatic: Negative for Hematologic/Lymphatic symptoms  Cardiovascular : Negative for cardiovascular symptoms  Respiratory : Negative for respiratory symptoms  Endocrine: Negative for endocrine symptoms  Musculoskeletal: Negative for musculoskeletal symptoms  Neurological: Negative for neurological symptoms  Psychologic: Negative for psychiatric symptoms

## 2021-09-18 NOTE — Progress Notes (Signed)
09/18/2021 11:25 AM   Adam Lee. [birth year] [id number]  Referring provider: Kathyrn Drown, MD [address] [place],  [place] [address]  Gross hematuria   HPI: [name] is a 65yo here for cystoscopy for gross hematuria. UA today is concerning for infection. He developed gross hematuria 3 days ago with associated dysuria, urinary frequency, urinary urgency and urinary hesitancy. No fevers. He has a hx of pelvic radiation for prostate cancer   PMH: Past Medical History:  Diagnosis Date   Anginal pain (Adam Lee) 2019   Diabetes mellitus without complication (Adam Lee)    Dysrhythmia    stress related. fast HR   History of stress test 2014   intermediate risk   Hyperlipemia    Hypertension    Prostate cancer [facility])     Surgical History: Past Surgical History:  Procedure Laterality Date   COLONOSCOPY N/A 01/03/2020   Procedure: COLONOSCOPY;  Surgeon: Adam Houston, MD;  [facility];  Service: Endoscopy;  Laterality: N/A;  [place] Left    2000, Adam Lee   LYMPHADENECTOMY Bilateral 01/23/2020   Procedure: LYMPHADENECTOMY;  Surgeon: Adam Frock, MD;  [facility];  Service: Urology;  Laterality: Bilateral;   POLYPECTOMY  01/03/2020   Procedure: POLYPECTOMY;  Surgeon: Adam Houston, MD;  [facility];  Service: Endoscopy;;   ROBOT ASSISTED LAPAROSCOPIC RADICAL PROSTATECTOMY N/A 01/23/2020   Procedure: XI ROBOTIC ASSISTED LAPAROSCOPIC RADICAL PROSTATECTOMY;  Surgeon: Adam Frock, MD;  [facility];  Service: Urology;  Laterality: N/A;  3 HRS    Home Medications:  Allergies as of 09/18/2021   No Known Allergies      Medication List        Accurate as of September 18, 2021 11:25 AM. If you have any questions, ask your nurse or doctor.          aspirin 81 MG chewable tablet Chew by mouth daily.   atorvastatin 10 MG tablet Commonly known as: LIPITOR TAKE 1 TABLET BY MOUTH EVERY DAY   blood glucose  meter kit and supplies Dispense based on patient and insurance preference. Use to check sugars once a day (FOR ICD-10 E10.9, E11.9).   cefUROXime 500 MG tablet Commonly known as: CEFTIN Take 1 tablet (500 mg total) by mouth 2 (two) times daily with a meal. Started by: Adam Bang, MD   fesoterodine 8 MG Tb24 tablet Commonly known as: TOVIAZ Take 1 tablet (8 mg total) by mouth daily.   Gemtesa 75 MG Tabs Generic drug: Vibegron Take 1 capsule by mouth daily.   HYDROcodone-acetaminophen 5-325 MG tablet Commonly known as: Norco Take 1-2 tablets by mouth every 6 (six) hours as needed for moderate pain.   lisinopril 10 MG tablet Commonly known as: ZESTRIL Take 1 tablet (10 mg total) by mouth daily.   metFORMIN 500 MG tablet Commonly known as: GLUCOPHAGE TAKE 2 TABLETS BY MOUTH TWICE A DAY   mirabegron ER 25 MG Tb24 tablet Commonly known as: MYRBETRIQ Take 1 tablet (25 mg total) by mouth daily.   nitrofurantoin (macrocrystal-monohydrate) 100 MG capsule Commonly known as: MACROBID Take 1 capsule (100 mg total) by mouth every 12 (twelve) hours.   nitroGLYCERIN 0.4 MG SL tablet Commonly known as: NITROSTAT Place 1 tablet (0.4 mg total) under the tongue every 5 (five) minutes as needed for chest pain.   OneTouch Delica Plus [id number] Misc USE TO OBTAIN A BLOOD SPECIMEN TO TEST BLOOD SUGAR DAILY  OneTouch Ultra test strip Generic drug: glucose blood USE TO TEST BLOOD SUGAR DAILY   sulfamethoxazole-trimethoprim 800-160 MG tablet Commonly known as: BACTRIM DS Take 1 tablet by mouth every 12 (twelve) hours.   traMADol 50 MG tablet Commonly known as: ULTRAM Take 1 tablet by mouth every 6 (six) hours as needed.        Allergies: No Known Allergies  Family History: Family History  Problem Relation Age of Onset   Diabetes Father    Heart disease Mother    Cancer Other    Breast cancer Neg Hx    Prostate cancer Neg Hx    Colon cancer Neg Hx    Pancreatic cancer  Neg Hx     Social History:  reports that he has never smoked. He has never used smokeless tobacco. He reports that he does not drink alcohol and does not use drugs.  ROS: All other review of systems were reviewed and are negative except what is noted above in HPI  Physical Exam: BP 129/81    Pulse 98   Constitutional:  Alert and oriented, No acute distress. HEENT: [place] AT, moist mucus membranes.  Trachea midline, no masses. Cardiovascular: No clubbing, cyanosis, or edema. Respiratory: Normal respiratory effort, no increased work of breathing. GI: Abdomen is soft, nontender, nondistended, no abdominal masses GU: No CVA tenderness.  Lymph: No cervical or inguinal lymphadenopathy. Skin: No rashes, bruises or suspicious lesions. Neurologic: Grossly intact, no focal deficits, moving all 4 extremities. Psychiatric: Normal mood and affect.  Laboratory Data: Lab Results  Component Value Date   WBC 7.1 01/15/2020   HGB 13.9 01/24/2020   HCT 43.1 01/24/2020   MCV 92.1 01/15/2020   PLT 208 01/15/2020    Lab Results  Component Value Date   CREATININE 1.53 (H) 01/14/2021    Lab Results  Component Value Date   PSA <0.1 03/04/2020   PSA 2.3 12/06/2014    Lab Results  Component Value Date   TESTOSTERONE 594 04/17/2021    Lab Results  Component Value Date   HGBA1C 7.0 (H) 01/14/2021    Urinalysis    Component Value Date/Time   APPEARANCEUR [place] (A) 08/31/2021 1358   GLUCOSEU Negative 08/31/2021 1358   BILIRUBINUR Negative 08/31/2021 1358   PROTEINUR 3+ (A) 08/31/2021 1358   NITRITE Positive (A) 08/31/2021 1358   LEUKOCYTESUR 1+ (A) 08/31/2021 1358    Lab Results  Component Value Date   LABMICR See below: 08/31/2021   WBCUA >30 (A) 08/31/2021   LABEPIT 0-10 08/31/2021   BACTERIA Many (A) 08/31/2021    Pertinent Imaging:  No results found for this or any previous visit.  No results found for this or any previous visit.  No results found for this or any previous  visit.  No results found for this or any previous visit.  No results found for this or any previous visit.  No results found for this or any previous visit.  No results found for this or any previous visit.  No results found for this or any previous visit.   Assessment & Plan:    1. Urine frequency -likely related to UTI - Urinalysis, Routine w reflex microscopic  2.  Gross hematuria -ceftin 570m BID for 7 days - Urinalysis, Routine w reflex microscopic - Urine Culture   Return in about 5 days (around 09/23/2021) for cystoscopy.  PNicolette Bang MD  [facility] [place]

## 2021-09-20 LAB — URINE CULTURE: Organism ID, Bacteria: NO GROWTH

## 2021-09-21 ENCOUNTER — Telehealth: Payer: Self-pay

## 2021-09-21 ENCOUNTER — Ambulatory Visit: Payer: Medicare HMO

## 2021-09-21 NOTE — Telephone Encounter (Signed)
Pt was called and informed of the negative urine culture. He was advised to complete the last 3 days of antibiotics that he is already on. Pt was understanding.

## 2021-09-21 NOTE — Telephone Encounter (Signed)
Patient notified. Please see note from J. Linton Rump CMA

## 2021-09-21 NOTE — Telephone Encounter (Signed)
-----   Message from Cleon Gustin, MD sent at 09/21/2021  8:36 AM EST ----- negative ----- Message ----- From: Iris Pert, LPN Sent: 97/74/1423   4:05 PM EST To: Cleon Gustin, MD  Please review

## 2021-09-21 NOTE — Telephone Encounter (Signed)
-----   Message from Reynaldo Minium, [place] sent at 09/21/2021  8:47 AM EST ----- Urine culture negative. No indication for any antibx at this time. ----- Message ----- From: Iris Pert, LPN Sent: 63/84/5364   4:05 PM EST To: Cleon Gustin, MD  Please review

## 2021-09-23 ENCOUNTER — Other Ambulatory Visit: Payer: Self-pay

## 2021-09-23 ENCOUNTER — Other Ambulatory Visit: Payer: Self-pay | Admitting: Urology

## 2021-09-23 ENCOUNTER — Encounter: Payer: Self-pay | Admitting: Urology

## 2021-09-23 ENCOUNTER — Ambulatory Visit (INDEPENDENT_AMBULATORY_CARE_PROVIDER_SITE_OTHER): Payer: Medicare HMO | Admitting: Urology

## 2021-09-23 VITALS — BP 125/80 | HR 75

## 2021-09-23 DIAGNOSIS — N3941 Urge incontinence: Secondary | ICD-10-CM | POA: Diagnosis not present

## 2021-09-23 DIAGNOSIS — R310 Gross hematuria: Secondary | ICD-10-CM | POA: Diagnosis not present

## 2021-09-23 DIAGNOSIS — N329 Bladder disorder, unspecified: Secondary | ICD-10-CM

## 2021-09-23 LAB — URINALYSIS, ROUTINE W REFLEX MICROSCOPIC
Bilirubin, UA: NEGATIVE
Glucose, UA: NEGATIVE
Ketones, UA: NEGATIVE
Leukocytes,UA: NEGATIVE
Nitrite, UA: NEGATIVE
Specific Gravity, UA: 1.02 (ref 1.005–1.030)
Urobilinogen, Ur: 0.2 mg/dL (ref 0.2–1.0)
pH, UA: 6 (ref 5.0–7.5)

## 2021-09-23 LAB — MICROSCOPIC EXAMINATION
Bacteria, UA: NONE SEEN
Epithelial Cells (non renal): NONE SEEN /hpf (ref 0–10)
RBC, Urine: >30 /hpf — AB (ref 0–2)
Renal Epithel, UA: NONE SEEN /hpf
WBC, UA: NONE SEEN /hpf (ref 0–5)

## 2021-09-23 NOTE — Progress Notes (Signed)
Pt called to come in at 0800 due to a cancellation on the surgical schedule. Pt voiced understanding and agreed to come early.

## 2021-09-23 NOTE — Progress Notes (Signed)
[facility] Posting Form   Surgery Date/Time: Date: 09/24/2021  Surgeon: Dr. Nicolette Bang, MD  Surgery Location: Day Surgery  Inpt ( No  )   Outpt (Yes)   Obs ( No  )   Diagnosis: Bladder Lesion N32.9  -CPT: 40459, 13685,99234  Surgery: Cystoscopy with bladder biopsy and fulgeration with bilateral retrograde pyelograms  Stop Anticoagulations: Yes, may continue ASA  Cardiac/Medical/Pulmonary Clearance needed: no   *Orders entered into EPIC  Date: 09/23/21   *Case booked in [place]  Date: 09/23/21  *Notified pt of Surgery: Date: 09/23/21  *Placed into Prior Authorization Work Fabio Bering Date: 09/23/21   Assistant/laser/rep:No

## 2021-09-23 NOTE — Progress Notes (Signed)
PTNS  Session # 5 of 45  Health & Social Factors: diabetic Caffeine: 2 cups Alcohol: none Daytime voids #per day: 4 Night-time voids #per night: 2 Urgency: no Incontinence Episodes #per day: none Ankle used: right Treatment Setting: 7 Feeling/ Response: positive sensation Comments: n/a  Performed By: [place] LPN  Follow Up: keep next scheduled [place]

## 2021-09-23 NOTE — Progress Notes (Signed)
Surgical Physician Order Form [facility] [place]  * Scheduling expectation :  09/24/2021  *Length of Case: 30 minutes  *MD Preforming Case: Nicolette Bang, MD  *Assistant Needed: no  *Facility Preference: [facility]  *Clearance needed: no  *Anticoagulation Instructions: Hold all anticoagulants  *Aspirin Instructions: Ok to continue Aspirin  -Admit type: OUTpatient  -Anesthesia: General  -Use Standing Orders:  NA  *Diagnosis: Bladder Lesion  *Procedure:  Cystoscopy, bilateral retrograde pyelograms  Cysto Bladder Biopsy (03979) and fulgeration  Additional orders: N/A  -Equipment:  C arm -VTE Prophylaxis Standing Order SCDs       Other:   -Standing Lab Orders Per Anesthesia    Lab other: None  -Standing Test orders EKG/Chest x-ray per Anesthesia       Test other:   - Medications:  Ancef 2gm IV  -Other orders:  Ok to proceed with Ancef PCN allergy reviewed  *Post-op visit Date/Instructions:  1 week voiding trial

## 2021-09-23 NOTE — Progress Notes (Signed)
° °  09/23/21  CC: gross hematuria   HPI: [name] is a 65yo here for cystoscopy for recurrent gross hematuria Blood pressure 125/80, pulse 75. NED. A&Ox3.   No respiratory distress   Abd soft, NT, ND Normal phallus with bilateral descended testicles  Cystoscopy Procedure Note  Patient identification was confirmed, informed consent was obtained, and patient was prepped using Betadine solution.  Lidocaine jelly was administered per urethral meatus.     Pre-Procedure: - Inspection reveals a normal caliber ureteral meatus.  Procedure: The flexible cystoscope was introduced without difficulty - No urethral strictures/lesions are present. 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 Risks/benefits/alterantives discussed  No follow-ups on file.  Nicolette Bang, MD

## 2021-09-23 NOTE — Patient Instructions (Signed)

## 2021-09-24 ENCOUNTER — Encounter: Payer: Self-pay | Admitting: Urology

## 2021-09-24 ENCOUNTER — Encounter (HOSPITAL_COMMUNITY): Payer: Self-pay | Admitting: Urology

## 2021-09-24 ENCOUNTER — Other Ambulatory Visit: Payer: Self-pay | Admitting: *Deleted

## 2021-09-24 ENCOUNTER — Ambulatory Visit (HOSPITAL_COMMUNITY): Payer: Medicare HMO | Admitting: Anesthesiology

## 2021-09-24 ENCOUNTER — Other Ambulatory Visit: Payer: Self-pay | Admitting: Family Medicine

## 2021-09-24 ENCOUNTER — Ambulatory Visit (HOSPITAL_COMMUNITY): Payer: Medicare HMO

## 2021-09-24 ENCOUNTER — Ambulatory Visit (HOSPITAL_COMMUNITY)
Admission: RE | Admit: 2021-09-24 | Discharge: 2021-09-24 | Disposition: A | Discharge status: Home / Self Care | Payer: Medicare HMO | Attending: Urology | Admitting: Urology

## 2021-09-24 ENCOUNTER — Ambulatory Visit (INDEPENDENT_AMBULATORY_CARE_PROVIDER_SITE_OTHER): Payer: Medicare HMO

## 2021-09-24 ENCOUNTER — Encounter (HOSPITAL_COMMUNITY)
Admission: RE | Disposition: A | Discharge status: Home / Self Care | Payer: Self-pay | Source: Home / Self Care | Attending: Urology

## 2021-09-24 DIAGNOSIS — Z7984 Long term (current) use of oral hypoglycemic drugs: Secondary | ICD-10-CM | POA: Diagnosis not present

## 2021-09-24 DIAGNOSIS — R310 Gross hematuria: Secondary | ICD-10-CM | POA: Diagnosis not present

## 2021-09-24 DIAGNOSIS — E1169 Type 2 diabetes mellitus with other specified complication: Secondary | ICD-10-CM

## 2021-09-24 DIAGNOSIS — N3090 Cystitis, unspecified without hematuria: Secondary | ICD-10-CM | POA: Diagnosis not present

## 2021-09-24 DIAGNOSIS — E785 Hyperlipidemia, unspecified: Secondary | ICD-10-CM | POA: Diagnosis not present

## 2021-09-24 DIAGNOSIS — Z8546 Personal history of malignant neoplasm of prostate: Secondary | ICD-10-CM | POA: Insufficient documentation

## 2021-09-24 DIAGNOSIS — Z9079 Acquired absence of other genital organ(s): Secondary | ICD-10-CM | POA: Diagnosis not present

## 2021-09-24 DIAGNOSIS — I2510 Atherosclerotic heart disease of native coronary artery without angina pectoris: Secondary | ICD-10-CM

## 2021-09-24 DIAGNOSIS — L929 Granulomatous disorder of the skin and subcutaneous tissue, unspecified: Secondary | ICD-10-CM | POA: Diagnosis not present

## 2021-09-24 DIAGNOSIS — I10 Essential (primary) hypertension: Secondary | ICD-10-CM | POA: Insufficient documentation

## 2021-09-24 DIAGNOSIS — Z79899 Other long term (current) drug therapy: Secondary | ICD-10-CM | POA: Diagnosis not present

## 2021-09-24 DIAGNOSIS — N329 Bladder disorder, unspecified: Secondary | ICD-10-CM | POA: Insufficient documentation

## 2021-09-24 DIAGNOSIS — I493 Ventricular premature depolarization: Secondary | ICD-10-CM

## 2021-09-24 DIAGNOSIS — E119 Type 2 diabetes mellitus without complications: Secondary | ICD-10-CM | POA: Diagnosis not present

## 2021-09-24 DIAGNOSIS — I4729 Other ventricular tachycardia: Secondary | ICD-10-CM | POA: Diagnosis not present

## 2021-09-24 DIAGNOSIS — N3289 Other specified disorders of bladder: Secondary | ICD-10-CM | POA: Diagnosis not present

## 2021-09-24 HISTORY — PX: CYSTOSCOPY WITH BIOPSY: SHX5122

## 2021-09-24 HISTORY — PX: CYSTOSCOPY W/ RETROGRADES: SHX1426

## 2021-09-24 HISTORY — PX: CYSTOSCOPY WITH FULGERATION: SHX6638

## 2021-09-24 LAB — BASIC METABOLIC PANEL
Anion gap: 10 (ref 5–15)
BUN: 29 mg/dL — ABNORMAL HIGH (ref 8–23)
CO2: 25 mmol/L (ref 22–32)
Calcium: 9.3 mg/dL (ref 8.9–10.3)
Chloride: 100 mmol/L (ref 98–111)
Creatinine, Ser: 1.55 mg/dL — ABNORMAL HIGH (ref 0.61–1.24)
GFR, Estimated: 49 mL/min — ABNORMAL LOW (ref >60)
Glucose, Bld: 150 mg/dL — ABNORMAL HIGH (ref 70–99)
Potassium: 4.3 mmol/L (ref 3.5–5.1)
Sodium: 135 mmol/L (ref 135–145)

## 2021-09-24 LAB — HEMOGLOBIN A1C
Hgb A1c MFr Bld: 8.1 % — ABNORMAL HIGH (ref 4.8–5.6)
Mean Plasma Glucose: 185.77 mg/dL

## 2021-09-24 LAB — GLUCOSE, CAPILLARY: Glucose-Capillary: 164 mg/dL — ABNORMAL HIGH (ref 70–99)

## 2021-09-24 SURGERY — CYSTOSCOPY, WITH BIOPSY
Anesthesia: General | Site: Ureter

## 2021-09-24 MED ORDER — ONDANSETRON HCL 4 MG/2ML IJ SOLN
INTRAMUSCULAR | Status: AC
  Filled 2021-09-24: qty 2

## 2021-09-24 MED ORDER — WATER FOR IRRIGATION, STERILE IR SOLN
Status: DC | PRN
  Administered 2021-09-24: 500 mL

## 2021-09-24 MED ORDER — PROPOFOL 10 MG/ML IV BOLUS
INTRAVENOUS | Status: DC | PRN
  Administered 2021-09-24: 170 mg via INTRAVENOUS

## 2021-09-24 MED ORDER — LIDOCAINE 2% (20 MG/ML) 5 ML SYRINGE
INTRAMUSCULAR | Status: DC | PRN
  Administered 2021-09-24: 100 mg via INTRAVENOUS
  Administered 2021-09-24: 80 mg via INTRAVENOUS

## 2021-09-24 MED ORDER — CEFAZOLIN SODIUM-DEXTROSE 2-4 GM/100ML-% IV SOLN
2.0000 g | INTRAVENOUS | Status: AC
  Administered 2021-09-24: 09:00:00 2 g via INTRAVENOUS

## 2021-09-24 MED ORDER — METOPROLOL TARTRATE 25 MG PO TABS: 25.0000 mg | ORAL_TABLET | Freq: Two times a day (BID) | ORAL | 1 refills | Status: DC

## 2021-09-24 MED ORDER — OXYCODONE-ACETAMINOPHEN 5-325 MG PO TABS: 1.0000 | ORAL_TABLET | ORAL | 0 refills | Status: AC | PRN

## 2021-09-24 MED ORDER — ROCURONIUM BROMIDE 10 MG/ML (PF) SYRINGE
PREFILLED_SYRINGE | INTRAVENOUS | Status: DC | PRN
  Administered 2021-09-24: 70 mg via INTRAVENOUS

## 2021-09-24 MED ORDER — PHENYLEPHRINE 40 MCG/ML (10ML) SYRINGE FOR IV PUSH (FOR BLOOD PRESSURE SUPPORT)
PREFILLED_SYRINGE | INTRAVENOUS | Status: AC
  Filled 2021-09-24: qty 20

## 2021-09-24 MED ORDER — ONDANSETRON HCL 4 MG/2ML IJ SOLN
INTRAMUSCULAR | Status: DC | PRN
  Administered 2021-09-24: 4 mg via INTRAVENOUS

## 2021-09-24 MED ORDER — ONDANSETRON HCL 4 MG/2ML IJ SOLN: 4.0000 mg | Freq: Once | INTRAMUSCULAR | Status: DC | PRN

## 2021-09-24 MED ORDER — LIDOCAINE HCL (PF) 2 % IJ SOLN
INTRAMUSCULAR | Status: AC
  Filled 2021-09-24: qty 5

## 2021-09-24 MED ORDER — DEXAMETHASONE SODIUM PHOSPHATE 10 MG/ML IJ SOLN
INTRAMUSCULAR | Status: DC | PRN
  Administered 2021-09-24: 5 mg via INTRAVENOUS

## 2021-09-24 MED ORDER — PROPOFOL 10 MG/ML IV BOLUS
INTRAVENOUS | Status: AC
  Filled 2021-09-24: qty 20

## 2021-09-24 MED ORDER — LACTATED RINGERS IV SOLN: INTRAVENOUS | Status: DC | PRN

## 2021-09-24 MED ORDER — METOPROLOL TARTRATE 25 MG PO TABS: 25.0000 mg | ORAL_TABLET | Freq: Two times a day (BID) | ORAL | Status: DC

## 2021-09-24 MED ORDER — FENTANYL CITRATE (PF) 100 MCG/2ML IJ SOLN
INTRAMUSCULAR | Status: DC | PRN
  Administered 2021-09-24: 50 ug via INTRAVENOUS

## 2021-09-24 MED ORDER — FENTANYL CITRATE (PF) 100 MCG/2ML IJ SOLN
INTRAMUSCULAR | Status: AC
  Filled 2021-09-24: qty 2

## 2021-09-24 MED ORDER — HYDROMORPHONE HCL 1 MG/ML IJ SOLN: 0.2500 mg | INTRAMUSCULAR | Status: DC | PRN

## 2021-09-24 MED ORDER — DIATRIZOATE MEGLUMINE 30 % UR SOLN
URETHRAL | Status: AC
  Filled 2021-09-24: qty 100

## 2021-09-24 MED ORDER — STERILE WATER FOR IRRIGATION IR SOLN
Status: DC | PRN
  Administered 2021-09-24 (×2): 3000 mL

## 2021-09-24 MED ORDER — ALBUTEROL SULFATE HFA 108 (90 BASE) MCG/ACT IN AERS
INHALATION_SPRAY | RESPIRATORY_TRACT | Status: DC | PRN
  Administered 2021-09-24: 4 via RESPIRATORY_TRACT

## 2021-09-24 MED ORDER — PHENYLEPHRINE 40 MCG/ML (10ML) SYRINGE FOR IV PUSH (FOR BLOOD PRESSURE SUPPORT)
PREFILLED_SYRINGE | INTRAVENOUS | Status: DC | PRN
  Administered 2021-09-24 (×2): 80 ug via INTRAVENOUS

## 2021-09-24 MED ORDER — CEFAZOLIN SODIUM-DEXTROSE 2-4 GM/100ML-% IV SOLN
INTRAVENOUS | Status: AC
  Filled 2021-09-24: qty 100

## 2021-09-24 SURGICAL SUPPLY — 27 items
BAG DRAIN URO TABLE W/ADPT NS (BAG) ×4 IMPLANT
BAG DRN 8 ADPR NS SKTRN CSTL (BAG) ×2
BAG HAMPER (MISCELLANEOUS) ×4 IMPLANT
CATH FOLEY 2WAY SLVR  5CC 18FR (CATHETERS) ×4
CATH FOLEY 2WAY SLVR 5CC 18FR (CATHETERS) IMPLANT
CATH INTERMIT  6FR 70CM (CATHETERS) ×4 IMPLANT
CLOTH BEACON ORANGE TIMEOUT ST (SAFETY) ×4 IMPLANT
DECANTER SPIKE VIAL GLASS SM (MISCELLANEOUS) ×4 IMPLANT
ELECT REM PT RETURN 9FT ADLT (ELECTROSURGICAL) ×4
ELECTRODE REM PT RTRN 9FT ADLT (ELECTROSURGICAL) ×2 IMPLANT
GLOVE SURG POLYISO LF SZ8 (GLOVE) ×4 IMPLANT
GLOVE SURG UNDER POLY LF SZ7 (GLOVE) ×8 IMPLANT
GOWN STRL REUS W/TWL LRG LVL3 (GOWN DISPOSABLE) ×4 IMPLANT
GOWN STRL REUS W/TWL XL LVL3 (GOWN DISPOSABLE) ×4 IMPLANT
GUIDEWIRE STR DUAL SENSOR (WIRE) IMPLANT
IV NS IRRIG 3000ML ARTHROMATIC (IV SOLUTION) ×6 IMPLANT
KIT TURNOVER CYSTO (KITS) ×4 IMPLANT
MANIFOLD NEPTUNE II (INSTRUMENTS) ×4 IMPLANT
NDL HYPO 18GX1.5 BLUNT FILL (NEEDLE) ×2 IMPLANT
NEEDLE HYPO 18GX1.5 BLUNT FILL (NEEDLE) ×4 IMPLANT
PACK CYSTO (CUSTOM PROCEDURE TRAY) ×4 IMPLANT
PAD ARMBOARD 7.5X6 YLW CONV (MISCELLANEOUS) ×4 IMPLANT
PAD TELFA 3X4 1S STER (GAUZE/BANDAGES/DRESSINGS) ×4 IMPLANT
SYR 10ML LL (SYRINGE) ×2 IMPLANT
TOWEL OR 17X26 4PK STRL BLUE (TOWEL DISPOSABLE) ×4 IMPLANT
WATER STERILE IRR 3000ML UROMA (IV SOLUTION) ×6 IMPLANT
WATER STERILE IRR 500ML POUR (IV SOLUTION) ×4 IMPLANT

## 2021-09-24 NOTE — Interval H&P Note (Signed)
History and Physical Interval Note:  09/24/2021 8:44 AM  [name].  has presented today for surgery, with the diagnosis of bladder lesion.  The various methods of treatment have been discussed with the patient and family. After consideration of risks, benefits and other options for treatment, the patient has consented to  Procedure(s) with comments: CYSTOSCOPY WITH BLADDER BIOPSY (N/A) - pt to arrive at 9:15, discussed with [place] (Bilateral) [place] (N/A) as a surgical intervention.  The patient's history has been reviewed, patient examined, no change in status, stable for surgery.  I have reviewed the patient's chart and labs.  Questions were answered to the patient's satisfaction.     Nicolette Bang

## 2021-09-24 NOTE — Anesthesia Preprocedure Evaluation (Addendum)
Anesthesia Evaluation  Patient identified by MRN, date of birth, ID band Patient awake    Reviewed: Allergy & Precautions, NPO status , Patient's Chart, lab work & pertinent test results  Airway Mallampati: II  TM Distance: >3 FB Neck ROM: Full    Dental  (+) Dental Advisory Given, Missing   Pulmonary shortness of breath and with exertion,    Pulmonary exam normal breath sounds clear to auscultation       Cardiovascular hypertension, Pt. on medications + angina Normal cardiovascular exam+ dysrhythmias (frequent PVCs)  Rhythm:Regular Rate:Normal  24-Sep-2021 08:07:47 [facility] ROUTINE RECORD 02-24-1956 (64 yr) Male Caucasian Vent. rate 79 BPM PR interval 170 ms QRS duration 94 ms QT/QTcB 380/435 ms P-R-T axes 44 55 95 Normal sinus rhythm Normal ECG   Neuro/Psych negative neurological ROS  negative psych ROS   GI/Hepatic negative GI ROS, Neg liver ROS,   Endo/Other  diabetes, Well Controlled, Type 2, Oral Hypoglycemic Agents  Renal/GU negative Renal ROS   Prostate cancer     Musculoskeletal negative musculoskeletal ROS (+)   Abdominal   Peds negative pediatric ROS (+)  Hematology negative hematology ROS (+)   Anesthesia Other Findings   Reproductive/Obstetrics negative OB ROS                           Anesthesia Physical Anesthesia Plan  ASA: 3  Anesthesia Plan: General   Post-op Pain Management: Dilaudid IV   Induction: Intravenous  PONV Risk Score and Plan: 4 or greater and Ondansetron and Dexamethasone  Airway Management Planned: Oral ETT  Additional Equipment:   Intra-op Plan:   Post-operative Plan: Extubation in OR  Informed Consent: I have reviewed the patients History and Physical, chart, labs and discussed the procedure including the risks, benefits and alternatives for the proposed anesthesia with the patient or authorized representative  who has indicated his/her understanding and acceptance.     Dental advisory given  Plan Discussed with: CRNA and Surgeon  Anesthesia Plan Comments:      Anesthesia Quick Evaluation

## 2021-09-24 NOTE — Anesthesia Postprocedure Evaluation (Signed)
Anesthesia Post Note  Patient: Adam Lee.  Procedure(s) Performed: CYSTOSCOPY WITH BLADDER BIOPSY (Bladder) CYSTOSCOPY WITH PYELOGRAM (Bilateral: Ureter) CYSTOSCOPY WITH FULGERATION (Bladder)  Patient location during evaluation: Phase II Anesthesia Type: General Level of consciousness: awake and alert and oriented Pain management: pain level controlled Vital Signs Assessment: post-procedure vital signs reviewed and stable Respiratory status: spontaneous breathing, nonlabored ventilation and respiratory function stable Cardiovascular status: blood pressure returned to baseline and stable Postop Assessment: no apparent nausea or vomiting Anesthetic complications: no Comments: patient had transient vtach multiple PVCs, resolved after giving lidocaine 100 mg iv, consulted cardiology.  Reviewed cardiology recommendations.    No notable events documented.   Last Vitals:  Vitals:   09/24/21 1050 09/24/21 1154  BP: 129/86 113/83  Pulse: 76 84  Resp: 20 18  Temp:  36.7 C  SpO2: 97% 95%    Last Pain:  Vitals:   09/24/21 1154  TempSrc: Oral  PainSc: 0-No pain                 Coren Crownover C Cesia Orf

## 2021-09-24 NOTE — Transfer of Care (Signed)
Immediate Anesthesia Transfer of Care Note  Patient: Adam Lee.  Procedure(s) Performed: CYSTOSCOPY WITH BLADDER BIOPSY (Bladder) CYSTOSCOPY WITH PYELOGRAM (Bilateral: Ureter) CYSTOSCOPY WITH FULGERATION (Bladder)  Patient Location: PACU  Anesthesia Type:General  Level of Consciousness: awake, alert , oriented and patient cooperative  Airway & Oxygen Therapy: Patient Spontanous Breathing and Patient connected to face mask oxygen  Post-op Assessment: Report given to RN, Post -op Vital signs reviewed and stable and Patient moving all extremities  Post vital signs: Reviewed and stable  Last Vitals:  Vitals Value Taken Time  BP 131/88 09/24/21 1031  Temp 36.7 C 09/24/21 1017  Pulse 86 09/24/21 1032  Resp 21 09/24/21 1032  SpO2 99 % 09/24/21 1032  Vitals shown include unvalidated device data.  Last Pain:  Vitals:   09/24/21 0816  TempSrc: Oral  PainSc: 0-No pain      Patients Stated Pain Goal: 5 (89/34/06 8403)  Complications: No notable events documented.

## 2021-09-24 NOTE — Op Note (Signed)
Preoperative diagnosis: gross hematuria  Postoperative diagnosis: Same  Procedure: 1 cystoscopy 2.  Bladder biopsy with fulgeration  Attending: Nicolette Bang  Anesthesia: General  Estimated blood loss: Minimal  Drains: 18 French foley  Specimens:  Bladder biopsies x 2  Antibiotics: ancef  Findings: unable to identify ureteral orifices. Numerous erythematous lesions on posterior wall and left lateral wall.  Indications: Patient is a 65 year old male with a history of gross hematuria and numerous bladder lesion on office cystoscopy.  After discussing treatment options, they decided proceed with bladder biopsy and fulgeration.  Procedure in detail: The patient was brought to the operating room and a brief timeout was done to ensure correct patient, correct procedure, correct site.  General anesthesia was administered patient was placed in dorsal lithotomy position.  Their genitalia was then prepped and draped in usual sterile fashion.  A rigid 24 French cystoscope was passed in the urethra and the bladder.  Bladder was inspected and we noted numerous erythematous lesions on the posterior wall and left lateral wall.  We then took 2 biopsies from the posterior wall and left lateral wall.  Hemostasis was then obtained with a bugbee. We then used the bugbee to fulgerate the other erythematous lesions. the bladder was then drained, a 18 French foley was placed and this concluded the procedure which was well tolerated by patient.  Complications: None  Condition: Stable, extubated, transferred to PACU  Plan: Patient will be discharged home and will followup in 5 days for voiding trial

## 2021-09-24 NOTE — Anesthesia Procedure Notes (Signed)
Procedure Name: Intubation Date/Time: 09/24/2021 9:14 AM Performed by: Myna Bright, CRNA Pre-anesthesia Checklist: Patient identified, Emergency Drugs available, Suction available and Patient being monitored Patient Re-evaluated:Patient Re-evaluated prior to induction Oxygen Delivery Method: Circle system utilized Preoxygenation: Pre-oxygenation with 100% oxygen Induction Type: IV induction Ventilation: Mask ventilation without difficulty and Oral airway inserted - appropriate to patient size Laryngoscope Size: Mac and 4 Grade View: Grade II Tube type: Oral Tube size: 7.5 mm Number of attempts: 1 Airway Equipment and Method: Stylet Placement Confirmation: ETT inserted through vocal cords under direct vision, positive ETCO2 and breath sounds checked- equal and bilateral Secured at: 22 cm Tube secured with: Tape Dental Injury: Teeth and Oropharynx as per pre-operative assessment

## 2021-09-24 NOTE — Progress Notes (Signed)
Cardiology PA and LPN at bedside in post op. Placed cardiac monitor on pt and educated about med change (metoprolol).

## 2021-09-24 NOTE — Progress Notes (Signed)
Verbal order for Echo, 2 wk Zio patch and fu appt after monitor.

## 2021-09-24 NOTE — Consult Note (Addendum)
Cardiology Consultation:   Patient ID: Adam Lee. MRN: [id number]; DOB: 20-Apr-1956  Admit date: 09/24/2021 Date of Consult: 09/24/2021  PCP:  Kathyrn Drown, MD   [facility] Prov[facility] Cardiologist:   last seen in 2017   Patient Profile:   Adam Lee. is a 65 y.o. male with a hx of ]HTN who is being seen 09/24/2021 for the evaluation of wide complex rhythm at the request of anesthesia .  History of Present Illness:   Adam Lee is a 65 yo with hx of HTN, HL, T2DM, prostate CA (s/p prostatectomy) and SOB.  Seen by [facility] for SOB and HTN   Last visit was in 2017.   Had a cardiolite scan in Nov 29014 that showed scar in base/mid anteroseptal wall, with minimal ischemia, scar in inferolateral wall and mild apical ischemia   CT in past showed mild coronary arteru calcifications      The pt has been followed recently by urology for hematuria and pelvic pain.  Had a cystoscopy today under general anesthesia  Biopsies of bladder taken.  Procedure was without complications  Rare PVC Immed post procedure he received a nebulizer  treatment (b agonist) Immediately after in the  post op area he was noted to have frequent 4 beat NSVT   Given IV lidocaine and it resolved  Currently in SR   The pt denies CP  No SOB  No palpitations . No presyncope or syncope  The patient says, in the remote past he may have noticed a palpitation after using inhaler.   He does not use inhaler now .    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 fast HR   History of stress test 2014   intermediate risk   Hyperlipemia    Hypertension    Prostate cancer [facility])     Past Surgical History:  Procedure Laterality Date   COLONOSCOPY N/A 01/03/2020   Procedure: COLONOSCOPY;  Surgeon:  Rogene Houston, MD;  Location[facility];  Service: Endoscopy;  Laterality: N/A;  [place] Left    2000, Dr. Aviva Signs   LYMPHADENECTOMY Bilateral 01/23/2020   Procedure: LYMPHADENECTOMY;  Surgeon: Alexis Frock, MD;  Location: [facility] ORS;  Service: Urology;  Laterality: Bilateral;   POLYPECTOMY  01/03/2020   Procedure: POLYPECTOMY;  Surgeon: Rogene Houston, MD;  Location: [facility];  Service: Endoscopy;;   ROBOT ASSISTED LAPAROSCOPIC RADICAL PROSTATECTOMY N/A 01/23/2020   Procedure: XI ROBOTIC ASSISTED LAPAROSCOPIC RADICAL PROSTATECTOMY;  Surgeon: Alexis Frock, MD;  Location: [facility];  Service: Urology;  Laterality: N/A;  3 HRS     Home Medications:  Prior to Admission medications   Medication Sig Start Date End Date Taking? Authorizing Provider  aspirin 81 MG chewable tablet Chew 81 mg by mouth daily.   Yes [provider]  atorvastatin (LIPITOR) 10 MG tablet TAKE 1 TABLET BY MOUTH EVERY DAY 07/02/21  Yes Luking, Scott A, MD  lisinopril (ZESTRIL) 10 MG tablet Take 1 tablet (10 mg total) by mouth daily. 05/07/21  Yes Kathyrn Drown, MD  metFORMIN (GLUCOPHAGE) 500 MG tablet TAKE 2 TABLETS BY MOUTH TWICE A DAY Patient taking  differently: Take 1,000 mg by mouth in the morning. 08/31/21  Yes Kathyrn Drown, MD  Omega-3 Fatty Acids (FISH OIL PO) Take 1 capsule by mouth in the morning.   Yes [provider]  oxyCODONE-acetaminophen (PERCOCET) 5-325 MG tablet Take 1 tablet by mouth every 4 (four) hours as needed for severe pain. 09/24/21 09/24/22 Yes McKenzie, Candee Furbish, MD  blood glucose meter kit and supplies Dispense based on patient and insurance preference. Use to check sugars once a day (FOR ICD-10 E10.9, E11.9). 08/14/19   Kathyrn Drown, MD  cefUROXime (CEFTIN) 500 MG tablet Take 1 tablet (500 mg total) by mouth 2 (two) times daily with a meal. Patient not taking: Reported on 09/23/2021 09/18/21   Cleon Gustin, MD  fesoterodine (TOVIAZ) 8 MG  TB24 tablet Take 1 tablet (8 mg total) by mouth daily. Patient not taking: Reported on 09/23/2021 04/24/21   Cleon Gustin, MD  Lancets (ONETOUCH DELICA PLUS [id number]) MISC USE TO OBTAIN A BLOOD SPECIMEN TO TEST BLOOD SUGAR DAILY 02/21/20   Kathyrn Drown, MD  nitrofurantoin, macrocrystal-monohydrate, (MACROBID) 100 MG capsule Take 1 capsule (100 mg total) by mouth every 12 (twelve) hours. Patient not taking: Reported on 09/23/2021 08/31/21   Cleon Gustin, MD  [facility] ULTRA test strip USE TO TEST BLOOD SUGAR DAILY 06/01/21   Kathyrn Drown, MD  sulfamethoxazole-trimethoprim (BACTRIM DS) 800-160 MG tablet Take 1 tablet by mouth every 12 (twelve) hours. Patient not taking: Reported on 09/23/2021 09/04/21   Cleon Gustin, MD    Inpatient Medications: Scheduled Meds:  Continuous Infusions:  PRN Meds: HYDROmorphone (DILAUDID) injection, ondansetron (ZOFRAN) IV, sterile water for irrigation, water for irrigation, sterile  Allergies:   No Known Allergies  Social History:   Social History   Socioeconomic History   Marital status: Married    Spouse name: Not on file   Number of children: 2   Years of education: Not on file   Highest education level: Not on file  Occupational History   Not on file  Tobacco Use   Smoking status: Never   Smokeless tobacco: Never  Vaping Use   Vaping Use: Never used  Substance and Sexual Activity   Alcohol use: No    Alcohol/week: 0.0 standard drinks   Drug use: No   Sexual activity: Yes  Other Topics Concern   Not on file  Social History Narrative   Not on file   Social Determinants of Health   Financial Resource Strain: Low Risk    Difficulty of Paying Living Expenses: Not hard at all  Food Insecurity: No Food Insecurity   Worried About Charity fundraiser in the Last Year: Never true   Ran Out of Food in the Last Year: Never true  Transportation Needs: No Transportation Needs   Lack of Transportation (Medical): No    Lack of Transportation (Non-Medical): No  Physical Activity: Sufficiently Active   Days of Exercise per Week: 5 days   Minutes of Exercise per Session: 30 min  Stress: No Stress Concern Present   Feeling of Stress : Not at all  Social Connections: Moderately Integrated   Frequency of Communication with Friends and Family: More than three times a week   Frequency of Social Gatherings with Friends and Family: More than three times a week   Attends Religious Services: 1 to 4 times per year   Active Member of [employer] or Organizations: No   Attends Archivist Meetings: Never  Marital Status: Married  Human resources officer Violence: Not At Risk   Fear of Current or Ex-Partner: No   Emotionally Abused: No   Physically Abused: No   Sexually Abused: No    Family History:    Family History  Problem Relation Age of Onset   Diabetes Father    Heart disease Mother    Cancer Other    Breast cancer Neg Hx    Prostate cancer Neg Hx    Colon cancer Neg Hx    Pancreatic cancer Neg Hx      ROS:  Please see the history of present illness.   All other ROS reviewed and negative.     Physical Exam/Data:   Vitals:   09/24/21 1017 09/24/21 1030 09/24/21 1040 09/24/21 1050  BP: (!) 137/93 131/88 134/84 129/86  Pulse: 96 86 91 76  Resp: (!) 22 (!) 31 (!) 24 20  Temp: 98 F (36.7 C)     TempSrc:      SpO2: 95% 99% 90% 97%  Weight:      Height:        Intake/Output Summary (Last 24 hours) at 09/24/2021 1054 Last data filed at 09/24/2021 1001 Gross per 24 hour  Intake 700 ml  Output 10 ml  Net 690 ml   Last 3 Weights 09/24/2021 07/27/2021 04/07/2021  Weight (lbs) 295 lb 292 lb (No Data)  Weight (kg) 133.811 kg 132.45 kg (No Data)     Body mass index is 37.88 kg/m.  General:  Well nourished, well developed, in no acute distress HEENT: normal Neck: no JVD Vascular: No carotid bruits; Distal pulses 2+ bilaterally Cardiac:  normal S1, S2; RRR; no murmur  Lungs:  clear to  auscultation bilaterally, no wheezing, rhonchi or rales  Abd: soft, defrreed Ext: no LE  edema Musculoskeletal:  No deformities, BUE and BLE strength normal and equal Skin: warm and dry  Neuro:  CNs 2-12 intact, no focal abnormalities noted Psych:  Normal affect   EKG:  The EKG was personally reviewed and demonstrates:  NSR  (8:07 AM) At 10:38   NSR    Telemetry:  Telemetry was personally reviewed and demonstrates:  SR with frequent Wide complexes (quadruplets) of probable NSVT  Relevant CV Studies: Novembe 2014   Myoview IMPRESSION:  Abnormal, intermediate risk exercise Cardiolite. No diagnostic ST  segment changes or chest pain at maximum work load of 9 METS.  Hypertensive response noted. Perfusion imaging suggests scar in the  mid to basal anteroseptal wall with mild ischemia at the base of the  defect. This is somewhat atypical for the course of the major  epicardial vessel. There is also suggestion of scar in the  inferolateral wall with mild ischemia near the apex. Findings are  concerning for the presence of underlying ischemic heart disease.  LVEF is calculated at 40% with wall motion abnormalities described  above.   Echo   Nov 2014  - Study data: This is a technically difficult study. The    patient has very poor acoustic windows, severely limiting    diagnostic interpretation. Consider repeat study with    echocontrast if clinically indicated.  - Left ventricle: The cavity size was normal. Wall thickness    was increased in a pattern of mild LVH. Cannot accurately    assess left ventricular systolic function. Based on    limited Doppler images, there is likely grade I diastolic    dysfunction.   Laboratory Data:  High Sensitivity Troponin:  No  results for input(s): TROPONINIHS in the last 720 hours.   Chemistry Recent Labs  Lab 09/24/21 0805  NA 135  K 4.3  CL 100  CO2 25  GLUCOSE 150*  BUN 29*  CREATININE 1.55*  CALCIUM 9.3  GFRNONAA 49*  ANIONGAP 10     No results for input(s): PROT, ALBUMIN, AST, ALT, ALKPHOS, BILITOT in the last 168 hours. Lipids No results for input(s): CHOL, TRIG, HDL, LABVLDL, LDLCALC, CHOLHDL in the last 168 hours.  HematologyNo results for input(s): WBC, RBC, HGB, HCT, MCV, MCH, MCHC, RDW, PLT in the last 168 hours. Thyroid No results for input(s): TSH, FREET4 in the last 168 hours.  BNPNo results for input(s): BNP, PROBNP in the last 168 hours.  DDimer No results for input(s): DDIMER in the last 168 hours.   Radiology/Studies:  DG C-Arm 1-60 Min-No Report  Result Date: 09/24/2021 Fluoroscopy was utilized by the requesting physician.  No radiographic interpretation.     Assessment and Plan:   NSVT   Pt with frequent salvos of quadruplets   This occurred mmed after he received a bronchodilator as a  nebulized treatment   The episode responded quickly to lidocaine   NO in SR     May be related to catecholamines from neb He is asymptomatic otherwise   REcomm: 1  Metoprolol 25 bid     2 Ambulate with assist  on tele    If OK then can go home     3   WIll arrange for f/u in cardiology with 1. echo   2.   Monitor and 3.  appt   Will reviw with S Luking as well     2  HTN   WIll need to be followed  as outpt  3  CAD  Pt with plaquing noted on CT scan in past    No symptoms ot suggest angina or CHF   Echo and monitor results pending   3  HL  Keep on statin     4  DM  With neuropathy   Pt says A1C was 6.2.  Dorris Carnes MD     For questions or updates, please contact [facility] Please consult [email or web address] for contact info under    Signed, Dorris Carnes, MD  09/24/2021 10:54 AM

## 2021-09-25 ENCOUNTER — Encounter (HOSPITAL_COMMUNITY): Payer: Self-pay | Admitting: Urology

## 2021-09-29 ENCOUNTER — Ambulatory Visit (INDEPENDENT_AMBULATORY_CARE_PROVIDER_SITE_OTHER): Payer: Medicare HMO

## 2021-09-29 ENCOUNTER — Other Ambulatory Visit: Payer: Self-pay

## 2021-09-29 DIAGNOSIS — R350 Frequency of micturition: Secondary | ICD-10-CM

## 2021-09-29 LAB — SURGICAL PATHOLOGY

## 2021-09-29 NOTE — Progress Notes (Signed)
PTNS  Session # 6 of 12  Health & Social Factors: Diabeties, Neuropathy Caffeine: 2-3 cups Alcohol: none Daytime voids #per day: 3-4 Night-time voids #per night: 2-3 Urgency: none Incontinence Episodes #per day: none Ankle used: left Treatment Setting: 11 Feeling/ Response: foot tingling Comments: none  Performed By: Estill Bamberg RN  Follow Up: weekly PTNS    Fill and Pull Catheter Removal  Patient is present today for a catheter removal.  Patient was cleaned and prepped in a sterile fashion 153ml of sterile water/ saline was instilled into the bladder when the patient felt the urge to urinate. 66ml of water was then drained from the balloon.  A 18FR foley cath was removed from the bladder no complications were noted .  Patient as then given some time to void on their own.  Patient can void  166ml on their own after some time.  Patient tolerated well.  Performed by: Estill Bamberg RN  Follow up/ Additional notes: none

## 2021-10-06 ENCOUNTER — Ambulatory Visit: Payer: Medicare HMO

## 2021-10-07 ENCOUNTER — Telehealth: Payer: Self-pay

## 2021-10-07 NOTE — Telephone Encounter (Signed)
The patient has questions about his zio monitor. I told him a nurse will give him a call back.

## 2021-10-07 NOTE — Telephone Encounter (Signed)
Spoke with pt who states that it has been 2 weeks since applying monitor. He ask if he may take it off and send back now. Instructed pt that he may remove monitor and return.

## 2021-10-07 NOTE — Telephone Encounter (Signed)
Mail box is still full cant get in touch with patient.

## 2021-10-09 ENCOUNTER — Ambulatory Visit (INDEPENDENT_AMBULATORY_CARE_PROVIDER_SITE_OTHER): Payer: Medicare HMO

## 2021-10-09 ENCOUNTER — Other Ambulatory Visit: Payer: Self-pay

## 2021-10-09 DIAGNOSIS — R350 Frequency of micturition: Secondary | ICD-10-CM

## 2021-10-09 NOTE — Progress Notes (Signed)
PTNS  Session # 7 of 45  Health & Social Factors: diabetes Caffeine: 2-3 cups Alcohol: none Daytime voids #per day: 3-4  Night-time voids #per night: 2-3 Urgency: none Incontinence Episodes #per day: none Ankle used: left Treatment Setting: 3 Feeling/ Response: tingling  Comments: none  Performed By: Estill Bamberg RN  Follow Up: 1 week PTNS

## 2021-10-12 ENCOUNTER — Ambulatory Visit: Payer: Medicare HMO

## 2021-10-12 DIAGNOSIS — I493 Ventricular premature depolarization: Secondary | ICD-10-CM | POA: Diagnosis not present

## 2021-10-13 ENCOUNTER — Telehealth: Payer: Self-pay | Admitting: Internal Medicine

## 2021-10-13 NOTE — Telephone Encounter (Signed)
Received call transferred from operator and spoke with zio rep.  Patient wore monitor from 12/22-1/4. Rep reports monitor showed 173 episodes of V tach. Episode on 12/22 at 3:18 PM. Average heart rate 140 beats per minute. Lasted 30 seconds. On page 13 and 14 of report. Also had 3 episodes of SVT.  All events were auto captured. No diary entries.  Report available on website.  Ordered by Dr Harrington Challenger when she consulted on patient.  Patient to see Dr Lovena Le in [place].

## 2021-10-13 NOTE — Telephone Encounter (Signed)
Monitor downloaded to Dr.Ross's inbox.

## 2021-10-13 NOTE — Telephone Encounter (Signed)
Zio calling with critical results for the patient's monitor

## 2021-10-15 ENCOUNTER — Telehealth: Payer: Self-pay | Admitting: Family Medicine

## 2021-10-15 ENCOUNTER — Ambulatory Visit (HOSPITAL_COMMUNITY)
Admission: RE | Admit: 2021-10-15 | Discharge: 2021-10-15 | Disposition: A | Discharge status: Home / Self Care | Payer: Medicare HMO | Source: Ambulatory Visit | Attending: Internal Medicine | Admitting: Internal Medicine

## 2021-10-15 ENCOUNTER — Other Ambulatory Visit: Payer: Self-pay

## 2021-10-15 ENCOUNTER — Ambulatory Visit (INDEPENDENT_AMBULATORY_CARE_PROVIDER_SITE_OTHER): Payer: Medicare HMO

## 2021-10-15 DIAGNOSIS — I10 Essential (primary) hypertension: Secondary | ICD-10-CM

## 2021-10-15 DIAGNOSIS — I493 Ventricular premature depolarization: Secondary | ICD-10-CM | POA: Insufficient documentation

## 2021-10-15 DIAGNOSIS — R350 Frequency of micturition: Secondary | ICD-10-CM

## 2021-10-15 LAB — ECHOCARDIOGRAM COMPLETE
Area-P 1/2: 2.83 cm2
MV VTI: 3.66 cm2
S' Lateral: 3.5 cm

## 2021-10-15 MED ORDER — PERFLUTREN LIPID MICROSPHERE
1.0000 mL | INTRAVENOUS | Status: AC | PRN
  Administered 2021-10-15: 5 mL via INTRAVENOUS
  Filled 2021-10-15: qty 10

## 2021-10-15 NOTE — Progress Notes (Signed)
*  PRELIMINARY RESULTS* Echocardiogram 2D Echocardiogram has been performed.  Adam Lee 10/15/2021, 3:13 PM

## 2021-10-15 NOTE — Telephone Encounter (Signed)
Multiple attempts to try to get in touch with patient mail box is full. Please advise

## 2021-10-15 NOTE — Telephone Encounter (Signed)
-----   Message from Vicente Males, LPN sent at [phone]  9:24 AM EST ----- Please contact pt and have him set up appt for follow-up in January regarding diabetes.  And follow-up from hospitalization and general health issues. Thank you.

## 2021-10-15 NOTE — Progress Notes (Signed)
PTNS  Session # 8 of [place] Caffeine: 2-3 cups Alcohol: none Daytime voids #per day: 3-4 Night-time voids #per night: 2-3 Urgency: no Incontinence Episodes #per day: none Ankle used: left Treatment Setting: 9 Feeling/ Response: positive sensation Comments: n/a  Performed By: Malin LPN  Follow Up: keep next scheduled NV

## 2021-10-15 NOTE — Addendum Note (Signed)
Addended by: Iris Pert on: 10/15/2021 02:22 PM   Modules accepted: Level of Service

## 2021-10-16 ENCOUNTER — Ambulatory Visit: Payer: Medicare HMO

## 2021-10-16 NOTE — Telephone Encounter (Signed)
Follow up as scheduled.  

## 2021-10-17 ENCOUNTER — Other Ambulatory Visit: Payer: Self-pay | Admitting: Family Medicine

## 2021-10-17 ENCOUNTER — Encounter: Payer: Self-pay | Admitting: Family Medicine

## 2021-10-17 NOTE — Telephone Encounter (Signed)
Letter sent to the patient reminding him to do a follow-up visit

## 2021-10-19 ENCOUNTER — Ambulatory Visit: Payer: Medicare HMO

## 2021-10-20 ENCOUNTER — Ambulatory Visit: Payer: Medicare HMO

## 2021-10-20 ENCOUNTER — Encounter: Payer: Self-pay | Admitting: Internal Medicine

## 2021-10-20 ENCOUNTER — Ambulatory Visit (INDEPENDENT_AMBULATORY_CARE_PROVIDER_SITE_OTHER): Payer: Medicare HMO | Admitting: Internal Medicine

## 2021-10-20 ENCOUNTER — Other Ambulatory Visit: Payer: Self-pay

## 2021-10-20 VITALS — BP 124/82 | HR 69 | Ht 74.0 in | Wt 285.6 lb

## 2021-10-20 DIAGNOSIS — I493 Ventricular premature depolarization: Secondary | ICD-10-CM | POA: Diagnosis not present

## 2021-10-20 NOTE — Patient Instructions (Signed)
Medication Instructions:  °Your physician recommends that you continue on your current medications as directed. Please refer to the Current Medication list given to you today. ° °*If you need a refill on your cardiac medications before your next appointment, please call your pharmacy* ° ° °Lab Work: °NONE  ° °If you have labs (blood work) drawn today and your tests are completely normal, you will receive your results only by: °MyChart Message (if you have MyChart) OR °A paper copy in the mail °If you have any lab test that is abnormal or we need to change your treatment, we will call you to review the results. ° ° °Testing/Procedures: °NONE  ° ° °Follow-Up: °At [facility], you and your health needs are our priority.  As part of our continuing mission to provide you with exceptional heart care, we have created designated Provider Care Teams.  These Care Teams include your primary Cardiologist (physician) and Advanced Practice Providers (APPs -  Physician Assistants and Nurse Practitioners) who all work together to provide you with the care you need, when you need it. ° °We recommend signing up for the patient portal called "MyChart".  Sign up information is provided on this After Visit Summary.  MyChart is used to connect with patients for Virtual Visits (Telemedicine).  Patients are able to view lab/test results, encounter notes, upcoming appointments, etc.  Non-urgent messages can be sent to your provider as well.   °To learn more about what you can do with MyChart, go to [email or web address].   ° °Your next appointment:   ° As Needed  ° °The format for your next appointment:   °In Person ° °Provider:   °Gregg Taylor, MD  ° ° °Other Instructions °Thank you for choosing [facility]! °  ° ° °

## 2021-10-20 NOTE — Progress Notes (Signed)
HPI Adam Lee is referred by Dr. Harrington Challenger for evaluation of VT. He is a pleasant 66 yo man with PVC's found during a urological procedure. He wore a cardiac monitor and had a 2D echo which demonstrated normal LV function and his heart monitor demonstrated PVC's and non-sustained as well as sustained VT which was asymptomatic. He denies episodes of chest pain, sob, palpitations or syncope or near syncope.  No Known Allergies   Current Outpatient Medications  Medication Sig Dispense Refill   aspirin 81 MG chewable tablet Chew 81 mg by mouth daily.     atorvastatin (LIPITOR) 10 MG tablet TAKE 1 TABLET BY MOUTH EVERY DAY 45 tablet 0   blood glucose meter kit and supplies Dispense based on patient and insurance preference. Use to check sugars once a day (FOR ICD-10 E10.9, E11.9). 1 each 0   Lancets (ONETOUCH DELICA PLUS [id number]) MISC USE TO OBTAIN A BLOOD SPECIMEN TO TEST BLOOD SUGAR DAILY 100 each 0   lisinopril (ZESTRIL) 10 MG tablet Take 1 tablet (10 mg total) by mouth daily. 90 tablet 0   metFORMIN (GLUCOPHAGE) 500 MG tablet TAKE 2 TABLETS BY MOUTH TWICE A DAY 120 tablet 1   metoprolol tartrate (LOPRESSOR) 25 MG tablet Take 1 tablet (25 mg total) by mouth 2 (two) times daily. 180 tablet 1   Omega-3 Fatty Acids (FISH OIL PO) Take 1 capsule by mouth in the morning.     oxyCODONE-acetaminophen (PERCOCET) 5-325 MG tablet Take 1 tablet by mouth every 4 (four) hours as needed for severe pain. 30 tablet 0   No current facility-administered medications for this visit.     Past Medical History:  Diagnosis Date   Anginal pain ([place]) 2019   Diabetes mellitus without complication ([place])    Dysrhythmia    stress related. fast HR   History of stress test 2014   intermediate risk   Hyperlipemia    Hypertension    Prostate cancer ([place])     ROS:   All systems reviewed and negative except as noted in the HPI.   Past Surgical History:  Procedure Laterality Date   COLONOSCOPY N/A 01/03/2020    Procedure: COLONOSCOPY;  Surgeon: Rogene Houston, MD;  [facility];  Service: Endoscopy;  Laterality: N/A;  [place] Bilateral 09/24/2021   Procedure: CYSTOSCOPY WITH PYELOGRAM;  Surgeon: Cleon Gustin, MD;  Location: [facility];  Service: Urology;  Laterality: Bilateral;   CYSTOSCOPY WITH BIOPSY N/A 09/24/2021   Procedure: CYSTOSCOPY WITH BLADDER BIOPSY;  Surgeon: Cleon Gustin, MD;  Location: [facility];  Service: Urology;  Laterality: N/A;   CYSTOSCOPY WITH FULGERATION N/A 09/24/2021   Procedure: CYSTOSCOPY WITH FULGERATION;  Surgeon: Cleon Gustin, MD;  [facility]: [facility];  Service: Urology;  Laterality: N/A;   HERNIA REPAIR Left    2000, Dr. Aviva Signs   LYMPHADENECTOMY Bilateral 01/23/2020   Procedure: LYMPHADENECTOMY;  Surgeon: Alexis Frock, MD;  [facility];  Service: Urology;  Laterality: Bilateral;   POLYPECTOMY  01/03/2020   Procedure: POLYPECTOMY;  Surgeon: Rogene Houston, MD;  [facility];  Service: Endoscopy;;   ROBOT ASSISTED LAPAROSCOPIC RADICAL PROSTATECTOMY N/A 01/23/2020   Procedure: XI ROBOTIC ASSISTED LAPAROSCOPIC RADICAL PROSTATECTOMY;  Surgeon: Alexis Frock, MD;  [facility];  Service: Urology;  Laterality: N/A;  3 HRS     Family History  Problem Relation Age of Onset   Diabetes Father    Heart disease  Mother    Cancer Other    Breast cancer Neg Hx    Prostate cancer Neg Hx    Colon cancer Neg Hx    Pancreatic cancer Neg Hx      Social History   Socioeconomic History   Marital status: Married    Spouse name: Not on file   Number of children: 2   Years of education: Not on file   Highest education level: Not on file  Occupational History   Not on file  Tobacco Use   Smoking status: Never   Smokeless tobacco: Never  Vaping Use   Vaping Use: Never used  Substance and Sexual Activity   Alcohol use: No    Alcohol/week: 0.0 standard drinks   Drug use: No   Sexual activity: Yes   Other Topics Concern   Not on file  Social History Narrative   Not on file   Social Determinants of Health   Financial Resource Strain: Low Risk    Difficulty of Paying Living Expenses: Not hard at all  Food Insecurity: No Food Insecurity   Worried About Charity fundraiser in the Last Year: Never true   [place] in the Last Year: Never true  Transportation Needs: No Transportation Needs   Lack of Transportation (Medical): No   Lack of Transportation (Non-Medical): No  Physical Activity: Sufficiently Active   Days of Exercise per Week: 5 days   Minutes of Exercise per Session: 30 min  Stress: No Stress Concern Present   Feeling of Stress : Not at all  Social Connections: Moderately Integrated   Frequency of Communication with Friends and Family: More than three times a week   Frequency of Social Gatherings with Friends and Family: More than three times a week   Attends Religious Services: 1 to 4 times per year   Active Member of [employer] or Organizations: No   Attends Music therapist: Never   Marital Status: Married  Human resources officer Violence: Not At Risk   Fear of Current or Ex-Partner: No   Emotionally Abused: No   Physically Abused: No   Sexually Abused: No     BP 124/82    Pulse 69    Ht 6' 2"  (3.73 m)    Wt 285 lb 9.6 oz (129.5 kg)    SpO2 95%    BMI 36.67 kg/m   Physical Exam:  Well appearing NAD HEENT: Unremarkable Neck:  No JVD, no thyromegally Lymphatics:  No adenopathy Back:  No CVA tenderness Lungs:  Clear with no wheezes HEART:  Regular rate rhythm, no murmurs, no rubs, no clicks Abd:  soft, positive bowel sounds, no organomegally, no rebound, no guarding Ext:  2 plus pulses, no edema, no cyanosis, no clubbing Skin:  No rashes no nodules Neuro:  CN II through XII intact, motor grossly intact   Assess/Plan:  VT - he is asymptomatic and has normal LV function and will undergo watchful waiting and continue his beta blocker. HTN - his  bp is well controlled. No change in meds. Obesity - I encouraged her to lose weight

## 2021-10-20 NOTE — Telephone Encounter (Signed)
Noted  

## 2021-10-22 ENCOUNTER — Other Ambulatory Visit: Payer: Self-pay | Admitting: Family Medicine

## 2021-10-22 NOTE — Telephone Encounter (Signed)
May have 30 days needs to schedule appointment before further refills please put this on the prescription thank you

## 2021-10-23 ENCOUNTER — Ambulatory Visit (INDEPENDENT_AMBULATORY_CARE_PROVIDER_SITE_OTHER): Payer: Medicare HMO

## 2021-10-23 ENCOUNTER — Other Ambulatory Visit: Payer: Self-pay

## 2021-10-23 DIAGNOSIS — R350 Frequency of micturition: Secondary | ICD-10-CM | POA: Diagnosis not present

## 2021-10-23 NOTE — Progress Notes (Signed)
PTNS  Session # 9 of 12  Health & Social Factors: diabetes Caffeine: 2-3 cups Alcohol: none Daytime voids #per day: 2-3 Night-time voids #per night: 2 Urgency: no Incontinence Episodes #per day: no Ankle used: left Treatment Setting: 4 Feeling/ Response: foot tingling Comments: none  Performed By: Estill Bamberg RN  Follow Up: 1 week PTNS

## 2021-10-26 ENCOUNTER — Ambulatory Visit: Payer: Medicare HMO

## 2021-10-30 ENCOUNTER — Other Ambulatory Visit: Payer: Self-pay

## 2021-10-30 ENCOUNTER — Other Ambulatory Visit: Payer: Self-pay | Admitting: Family Medicine

## 2021-10-30 ENCOUNTER — Ambulatory Visit (INDEPENDENT_AMBULATORY_CARE_PROVIDER_SITE_OTHER): Payer: Medicare HMO

## 2021-10-30 DIAGNOSIS — R350 Frequency of micturition: Secondary | ICD-10-CM | POA: Diagnosis not present

## 2021-10-30 DIAGNOSIS — E7849 Other hyperlipidemia: Secondary | ICD-10-CM

## 2021-10-30 NOTE — Progress Notes (Signed)
PTNS Session # 10 of 12  Health & Social Factors: diabetes Caffeine: 2-3 cups Alcohol: none Daytime voids #per day: 3-4 Night-time voids #per night: 1-2 Urgency: none Incontinence Episodes #per day: never Ankle used: left Treatment Setting: 3 Feeling/ Response: strong tingling sensation down to big toe Comments: none  Performed By: Estill Bamberg RN  Follow Up: weekly PTNS

## 2021-11-02 ENCOUNTER — Other Ambulatory Visit (HOSPITAL_COMMUNITY): Payer: Medicare HMO

## 2021-11-02 ENCOUNTER — Ambulatory Visit: Payer: Medicare HMO

## 2021-11-06 ENCOUNTER — Ambulatory Visit (INDEPENDENT_AMBULATORY_CARE_PROVIDER_SITE_OTHER): Payer: Medicare HMO

## 2021-11-06 ENCOUNTER — Other Ambulatory Visit: Payer: Self-pay

## 2021-11-06 DIAGNOSIS — R350 Frequency of micturition: Secondary | ICD-10-CM | POA: Diagnosis not present

## 2021-11-06 NOTE — Progress Notes (Signed)
PTNS  Session # 11 of 12  Health & Social Factors: diabetes Caffeine: 3-4 Alcohol: none Daytime voids #per day: 3-4 Night-time voids #per night: 2 times Urgency: none Incontinence Episodes #per day: none Ankle used: left Treatment Setting: 3 Feeling/ Response: positive sensation to heel Comments: none  Performed By: Barnetta Chapel LPN  Follow Up: 1 week PTNS

## 2021-11-09 ENCOUNTER — Ambulatory Visit: Payer: Medicare HMO

## 2021-11-13 ENCOUNTER — Other Ambulatory Visit: Payer: Self-pay

## 2021-11-13 ENCOUNTER — Ambulatory Visit (INDEPENDENT_AMBULATORY_CARE_PROVIDER_SITE_OTHER): Payer: Medicare HMO

## 2021-11-13 DIAGNOSIS — R350 Frequency of micturition: Secondary | ICD-10-CM | POA: Diagnosis not present

## 2021-11-13 NOTE — Addendum Note (Signed)
Addended by: Dorisann Frames on: 11/13/2021 01:18 PM   Modules accepted: Level of Service

## 2021-11-13 NOTE — Progress Notes (Signed)
PTNS  Session # 12 of 12  Health & Social Factors: none Caffeine: 3-4 cups Alcohol: None Daytime voids #per day: 3-4 Night-time voids #per night: 2 Urgency: None Incontinence Episodes #per day: Never Ankle used: Left Treatment Setting: Level 5 Feeling/ Response: + sensation to foot and heel Comments: Follow up in 1 month  Performed By: Janett Billow CMA  Follow Up: 1 Month

## 2021-11-16 ENCOUNTER — Other Ambulatory Visit: Payer: Self-pay | Admitting: Family Medicine

## 2021-12-04 ENCOUNTER — Other Ambulatory Visit: Payer: Self-pay

## 2021-12-04 ENCOUNTER — Ambulatory Visit (INDEPENDENT_AMBULATORY_CARE_PROVIDER_SITE_OTHER): Payer: Medicare HMO | Admitting: Family Medicine

## 2021-12-04 VITALS — BP 124/78 | HR 63 | Temp 98.3°F | Ht 74.0 in | Wt 289.0 lb

## 2021-12-04 DIAGNOSIS — I10 Essential (primary) hypertension: Secondary | ICD-10-CM

## 2021-12-04 DIAGNOSIS — E119 Type 2 diabetes mellitus without complications: Secondary | ICD-10-CM | POA: Diagnosis not present

## 2021-12-04 DIAGNOSIS — C61 Malignant neoplasm of prostate: Secondary | ICD-10-CM

## 2021-12-04 DIAGNOSIS — E1169 Type 2 diabetes mellitus with other specified complication: Secondary | ICD-10-CM

## 2021-12-04 DIAGNOSIS — E7849 Other hyperlipidemia: Secondary | ICD-10-CM | POA: Diagnosis not present

## 2021-12-04 MED ORDER — ATORVASTATIN CALCIUM 10 MG PO TABS: 10.0000 mg | ORAL_TABLET | Freq: Every day | ORAL | 1 refills | Status: DC

## 2021-12-04 MED ORDER — LISINOPRIL 10 MG PO TABS: 10.0000 mg | ORAL_TABLET | Freq: Every day | ORAL | 1 refills | Status: DC

## 2021-12-04 MED ORDER — METFORMIN HCL 500 MG PO TABS: 1000.0000 mg | ORAL_TABLET | Freq: Two times a day (BID) | ORAL | 1 refills | Status: DC

## 2021-12-04 NOTE — Patient Instructions (Signed)

## 2021-12-04 NOTE — Telephone Encounter (Signed)
Patient had appointment on 12/04/21 ?

## 2021-12-04 NOTE — Progress Notes (Signed)
? ?  Subjective:  ? ? Patient ID: Adam Lee., male    DOB: [birth year], 66 y.o.   MRN: [id number] ? ?Diabetes ?He presents for his follow-up diabetic visit. He has type 2 diabetes mellitus. Current diabetic treatments: metformin.  ?Primary hypertension - Plan: Hemoglobin A1c, Hepatic function panel, Lipid panel, Basic metabolic panel, Urine Microalbumin w/creat. ratio ? ?Other hyperlipidemia - Plan: atorvastatin (LIPITOR) 10 MG tablet, Hemoglobin A1c, Hepatic function panel, Lipid panel, Basic metabolic panel, Urine Microalbumin w/creat. ratio ? ?Type II diabetes mellitus, well controlled ([place]) - Plan: Hemoglobin A1c, Hepatic function panel, Lipid panel, Basic metabolic panel, Urine Microalbumin w/creat. ratio ? ?Morbid obesity ([place]) ?Patient has blood pressure issues takes his medication regular basis tries to be somewhat healthy with his diet ? ?Diabetes he does state he was checking his sugar until he got a new meter and he does not quite know how this 1 works but he states he will run this weekend and check it periodically ? ?A1c has not been under good control we need to do a follow-up ? ?Hyperlipidemia associated with diabetes watching diet continue medication ? ? ?Review of Systems ? ?   ?Objective:  ? Physical Exam ?General-in no acute distress ?Eyes-no discharge ?Lungs-respiratory rate normal, CTA ?CV-no murmurs,RRR ?Extremities skin warm dry no edema ?Neuro grossly normal ?Behavior normal, alert ? ? ? ? ?   ?Assessment & Plan:  ?1. Other hyperlipidemia ?Hyperlipidemia continue cholesterol medicine watch diet stay physically active ?- atorvastatin (LIPITOR) 10 MG tablet; Take 1 tablet (10 mg total) by mouth daily.  Dispense: 100 tablet; Refill: 1 ?- Hemoglobin A1c ?- Hepatic function panel ?- Lipid panel ?- Basic metabolic panel ?- Urine Microalbumin w/creat. ratio ? ?2. Primary hypertension ?Blood pressure under decent control today continue current medication watch diet watch portions ?- Hemoglobin  A1c ?- Hepatic function panel ?- Lipid panel ?- Basic metabolic panel ?- Urine Microalbumin w/creat. ratio ? ?3. Type II diabetes mellitus, well controlled ([place]) ?In the past subpar control check A1c by the end of the month if not dramatically better will need other interventions ? ?Morbid obesity patient to watch portion control stay physically active ?- Hemoglobin A1c ?- Hepatic function panel ?- Lipid panel ?- Basic metabolic panel ?- Urine Microalbumin w/creat. ratio ? ?Patient does have incontinence issues related to prostate cancer surgery ?

## 2021-12-05 ENCOUNTER — Other Ambulatory Visit: Payer: Self-pay | Admitting: Family Medicine

## 2021-12-11 ENCOUNTER — Other Ambulatory Visit: Payer: Self-pay | Admitting: Family Medicine

## 2021-12-18 ENCOUNTER — Encounter: Payer: Self-pay | Admitting: Urology

## 2021-12-18 ENCOUNTER — Other Ambulatory Visit: Payer: Self-pay

## 2021-12-18 ENCOUNTER — Ambulatory Visit (INDEPENDENT_AMBULATORY_CARE_PROVIDER_SITE_OTHER): Payer: Medicare HMO | Admitting: Urology

## 2021-12-18 VITALS — BP 135/75 | HR 70

## 2021-12-18 DIAGNOSIS — N3941 Urge incontinence: Secondary | ICD-10-CM | POA: Diagnosis not present

## 2021-12-18 DIAGNOSIS — C61 Malignant neoplasm of prostate: Secondary | ICD-10-CM

## 2021-12-18 DIAGNOSIS — R350 Frequency of micturition: Secondary | ICD-10-CM

## 2021-12-18 LAB — URINALYSIS, ROUTINE W REFLEX MICROSCOPIC
Bilirubin, UA: NEGATIVE
Ketones, UA: NEGATIVE
Nitrite, UA: NEGATIVE
Specific Gravity, UA: 1.025 (ref 1.005–1.030)
Urobilinogen, Ur: 0.2 mg/dL (ref 0.2–1.0)
pH, UA: 6 (ref 5.0–7.5)

## 2021-12-18 LAB — MICROSCOPIC EXAMINATION
Epithelial Cells (non renal): NONE SEEN /hpf (ref 0–10)
Renal Epithel, UA: NONE SEEN /hpf
WBC, UA: >30 /hpf — AB (ref 0–5)

## 2021-12-18 NOTE — Progress Notes (Signed)
? ?12/18/2021 ?11:58 AM  ? ?Adam D Nicolaou Jr. ?01/31/1956 ?[id number] ? ?Referring provider: Luking, Adam A, MD ?[address] ?[address] ?,  [place] [address] ? ?Followup urge incontinence ? ? ?HPI: ?Adam Lee is Lee 66yo here for followup for urge incontinence. He notes improvement with PTNS. He uses 2 pads per day. He is happier with his urination. No straining to urinate. He has occasional feeling of incomplete emptying. No other complaints today  ? ? ?PMH: ?Past Medical History:  ?Diagnosis Date  ? Anginal pain ([facility]) 2019  ? Diabetes mellitus without complication ([facility])   ? Dysrhythmia   ? stress related. fast HR  ? History of stress test 2014  ? intermediate risk  ? Hyperlipemia   ? Hypertension   ? Prostate cancer ([facility])   ? ? ?Surgical History: ?Past Surgical History:  ?Procedure Laterality Date  ? COLONOSCOPY N/Lee 01/03/2020  ? Procedure: COLONOSCOPY;  Surgeon: Adam Lee, Adam U, MD;  [facility];  Service: Endoscopy;  Laterality: N/Lee;  1055  ? CYSTOSCOPY W/ RETROGRADES Bilateral 09/24/2021  ? Procedure: CYSTOSCOPY WITH PYELOGRAM;  Surgeon: McKenzie, Adam L, MD;  [facility];  Service: Urology;  Laterality: Bilateral;  ? CYSTOSCOPY WITH BIOPSY N/Lee 09/24/2021  ? Procedure: CYSTOSCOPY WITH BLADDER BIOPSY;  Surgeon: McKenzie, Adam L, MD;  [facility];  Service: Urology;  Laterality: N/Lee;  ? CYSTOSCOPY WITH FULGERATION N/Lee 09/24/2021  ? Procedure: CYSTOSCOPY WITH FULGERATION;  Surgeon: McKenzie, Adam L, MD;  [facility];  Service: Urology;  Laterality: N/Lee;  ? HERNIA REPAIR Left   ? 2000, Dr. Mark Lee  ? LYMPHADENECTOMY Bilateral 01/23/2020  ? Procedure: LYMPHADENECTOMY;  Surgeon: Manny, Theodore, MD;  [facility];  Service: Urology;  Laterality: Bilateral;  ? POLYPECTOMY  01/03/2020  ? Procedure: POLYPECTOMY;  Surgeon: Adam Lee, Adam U, MD;  [facility];  Service: Endoscopy;;  ? ROBOT ASSISTED LAPAROSCOPIC RADICAL PROSTATECTOMY N/Lee 01/23/2020  ? Procedure: XI  ROBOTIC ASSISTED LAPAROSCOPIC RADICAL PROSTATECTOMY;  Surgeon: Manny, Theodore, MD;  [facility];  Service: Urology;  Laterality: N/Lee;  3 HRS  ? ? ?Home Medications:  ?Allergies as of 12/18/2021   ?No Known Allergies ?  ? ?  ?Medication List  ?  ? ?  ? Accurate as of December 18, 2021 11:58 AM. If you have any questions, ask your nurse or doctor.  ?  ?  ? ?  ? ?aspirin 81 MG chewable tablet ?Chew 81 mg by mouth daily. ?  ?atorvastatin 10 MG tablet ?Commonly known as: LIPITOR ?Take 1 tablet (10 mg total) by mouth daily. ?  ?blood glucose meter kit and supplies ?Dispense based on patient and insurance preference. Use to check sugars once Lee day (FOR ICD-10 E10.9, E11.9). ?  ?FISH OIL PO ?Take 1 capsule by mouth in the morning. ?  ?lisinopril 10 MG tablet ?Commonly known as: ZESTRIL ?Take 1 tablet (10 mg total) by mouth daily. NEEDS TO SCHEDULE AN APPOINTMENT BEFORE FURTHER REFILLS ?  ?metFORMIN 500 MG tablet ?Commonly known as: GLUCOPHAGE ?Take 2 tablets (1,000 mg total) by mouth 2 (two) times daily. ?  ?metoprolol tartrate 25 MG tablet ?Commonly known as: LOPRESSOR ?Take 1 tablet (25 mg total) by mouth 2 (two) times daily. ?  ?OneTouch Delica Plus Lancet33G Misc ?USE TO OBTAIN Lee BLOOD SPECIMEN TO TEST BLOOD SUGAR DAILY ?  ?oxyCODONE-acetaminophen 5-325 MG tablet ?Commonly known as: Percocet ?Take 1 tablet by mouth every 4 (four) hours as needed for severe pain. ?  ? ?  ? ? ?  Allergies: No Known Allergies ? ?Family History: ?Family History  ?Problem Relation Age of Onset  ? Diabetes Father   ? Heart disease Mother   ? Cancer Other   ? Breast cancer Neg Hx   ? Prostate cancer Neg Hx   ? Colon cancer Neg Hx   ? Pancreatic cancer Neg Hx   ? ? ?Social History:  reports that he has never smoked. He has never used smokeless tobacco. He reports that he does not drink alcohol and does not use drugs. ? ?ROS: ?All other review of systems were reviewed and are negative except what is noted above in HPI ? ?Physical Exam: ?BP  135/75   Pulse 70   ?Constitutional:  Alert and oriented, No acute distress. ?HEENT: [place] AT, moist mucus membranes.  Trachea midline, no masses. ?Cardiovascular: No clubbing, cyanosis, or edema. ?Respiratory: Normal respiratory effort, no increased work of breathing. ?GI: Abdomen is soft, nontender, nondistended, no abdominal masses ?GU: No CVA tenderness.  ?Lymph: No cervical or inguinal lymphadenopathy. ?Skin: No rashes, bruises or suspicious lesions. ?Neurologic: Grossly intact, no focal deficits, moving all 4 extremities. ?Psychiatric: Normal mood and affect. ? ?Laboratory Data: ?Lab Results  ?Component Value Date  ? WBC 7.1 01/15/2020  ? HGB 13.9 01/24/2020  ? HCT 43.1 01/24/2020  ? MCV 92.1 01/15/2020  ? PLT 208 01/15/2020  ? ? ?Lab Results  ?Component Value Date  ? CREATININE 1.55 (H) 09/24/2021  ? ? ?Lab Results  ?Component Value Date  ? PSA <0.1 03/04/2020  ? PSA 2.3 12/06/2014  ? ? ?Lab Results  ?Component Value Date  ? TESTOSTERONE 594 04/17/2021  ? ? ?Lab Results  ?Component Value Date  ? HGBA1C 8.1 (H) 09/24/2021  ? ? ?Urinalysis ?   ?Component Value Date/Time  ? APPEARANCEUR Hazy ([place]) 09/23/2021 1339  ? GLUCOSEU Negative 09/23/2021 1339  ? BILIRUBINUR Negative 09/23/2021 1339  ? PROTEINUR 1+ ([place]) 09/23/2021 1339  ? NITRITE Negative 09/23/2021 1339  ? LEUKOCYTESUR Negative 09/23/2021 1339  ? ? ?Lab Results  ?Component Value Date  ? LABMICR See below: 09/23/2021  ? [place] None seen 09/23/2021  ? LABEPIT None seen 09/23/2021  ? BACTERIA None seen 09/23/2021  ? ? ?Pertinent Imaging: ? ?No results found for this or any previous visit. ? ?No results found for this or any previous visit. ? ?No results found for this or any previous visit. ? ?No results found for this or any previous visit. ? ?No results found for this or any previous visit. ? ?No results found for this or any previous visit. ? ?No results found for this or any previous visit. ? ?No results found for this or any previous visit. ? ? ?Assessment &  Plan:   ? ?1. Urine frequency ?-We will proceed with monthly PTNS ?- Urinalysis, Routine w reflex microscopic ? ?2. Urge incontinence ?We will proceed with monthly PTNS ? ? ? ? ?No follow-ups on file. ? ?Nicolette Bang, MD ? ?[facility] ?  ?

## 2021-12-18 NOTE — Patient Instructions (Signed)

## 2021-12-27 ENCOUNTER — Other Ambulatory Visit: Payer: Self-pay | Admitting: Family Medicine

## 2022-01-01 ENCOUNTER — Ambulatory Visit (INDEPENDENT_AMBULATORY_CARE_PROVIDER_SITE_OTHER): Payer: Medicare HMO

## 2022-01-01 DIAGNOSIS — R350 Frequency of micturition: Secondary | ICD-10-CM

## 2022-01-01 DIAGNOSIS — E7849 Other hyperlipidemia: Secondary | ICD-10-CM | POA: Diagnosis not present

## 2022-01-01 DIAGNOSIS — E119 Type 2 diabetes mellitus without complications: Secondary | ICD-10-CM | POA: Diagnosis not present

## 2022-01-01 DIAGNOSIS — I10 Essential (primary) hypertension: Secondary | ICD-10-CM | POA: Diagnosis not present

## 2022-01-01 NOTE — Progress Notes (Signed)
PTNS ? ?Session # 13 of 37 ? ?Health & Social Factors: Diabetes ?Caffeine: 2-3 cups ?Alcohol: none ?Daytime voids #per day: 3-4 ?Night-time voids #per night: 2-3 ?Urgency: no ?Incontinence Episodes #per day: none ?Ankle used: right ?Treatment Setting: 7 ?Feeling/ Response: feeling in the ankle ?Comments: n/a ? ?Performed By: Levi Aland, CMA ? ?Follow Up: Keep next scheduled apt.  ?

## 2022-01-02 LAB — LIPID PANEL
Chol/HDL Ratio: 2.6 ratio (ref 0.0–5.0)
Cholesterol, Total: 123 mg/dL (ref 100–199)
HDL: 48 mg/dL (ref >39)
LDL Chol Calc (NIH): 52 mg/dL (ref 0–99)
Triglycerides: 129 mg/dL (ref 0–149)
VLDL Cholesterol Cal: 23 mg/dL (ref 5–40)

## 2022-01-02 LAB — HEPATIC FUNCTION PANEL
ALT: 13 IU/L (ref 0–44)
AST: 18 IU/L (ref 0–40)
Albumin: 4.4 g/dL (ref 3.8–4.8)
Alkaline Phosphatase: 82 IU/L (ref 44–121)
Bilirubin Total: 0.5 mg/dL (ref 0.0–1.2)
Bilirubin, Direct: 0.15 mg/dL (ref 0.00–0.40)
Total Protein: 6.6 g/dL (ref 6.0–8.5)

## 2022-01-02 LAB — BASIC METABOLIC PANEL
BUN/Creatinine Ratio: 20 (ref 10–24)
BUN: 32 mg/dL — ABNORMAL HIGH (ref 8–27)
CO2: 29 mmol/L (ref 20–29)
Calcium: 9.4 mg/dL (ref 8.6–10.2)
Chloride: 97 mmol/L (ref 96–106)
Creatinine, Ser: 1.57 mg/dL — ABNORMAL HIGH (ref 0.76–1.27)
Glucose: 166 mg/dL — ABNORMAL HIGH (ref 70–99)
Potassium: 4.8 mmol/L (ref 3.5–5.2)
Sodium: 139 mmol/L (ref 134–144)
eGFR: 48 mL/min/{1.73_m2} — ABNORMAL LOW (ref >59)

## 2022-01-02 LAB — MICROALBUMIN / CREATININE URINE RATIO
Creatinine, Urine: 101.2 mg/dL
Microalb/Creat Ratio: 34 mg/g creat — ABNORMAL HIGH (ref 0–29)
Microalbumin, Urine: 34.6 ug/mL

## 2022-01-02 LAB — HEMOGLOBIN A1C
Est. average glucose Bld gHb Est-mCnc: 192 mg/dL
Hgb A1c MFr Bld: 8.3 % — ABNORMAL HIGH (ref 4.8–5.6)

## 2022-01-22 ENCOUNTER — Other Ambulatory Visit: Payer: Medicare HMO

## 2022-01-22 ENCOUNTER — Other Ambulatory Visit: Payer: Self-pay | Admitting: Family Medicine

## 2022-01-22 DIAGNOSIS — C61 Malignant neoplasm of prostate: Secondary | ICD-10-CM

## 2022-01-23 LAB — PSA: Prostate Specific Ag, Serum: <0.1 ng/mL (ref 0.0–4.0)

## 2022-01-25 ENCOUNTER — Telehealth: Payer: Self-pay | Admitting: Family Medicine

## 2022-01-25 ENCOUNTER — Ambulatory Visit (INDEPENDENT_AMBULATORY_CARE_PROVIDER_SITE_OTHER): Payer: Medicare HMO | Admitting: Family Medicine

## 2022-01-25 DIAGNOSIS — Z79899 Other long term (current) drug therapy: Secondary | ICD-10-CM | POA: Diagnosis not present

## 2022-01-25 DIAGNOSIS — E7849 Other hyperlipidemia: Secondary | ICD-10-CM | POA: Diagnosis not present

## 2022-01-25 DIAGNOSIS — E1169 Type 2 diabetes mellitus with other specified complication: Secondary | ICD-10-CM

## 2022-01-25 DIAGNOSIS — I10 Essential (primary) hypertension: Secondary | ICD-10-CM

## 2022-01-25 MED ORDER — TRULICITY 0.75 MG/0.5ML ~~LOC~~ SOAJ: 0.7500 mg | SUBCUTANEOUS | 3 refills | Status: DC

## 2022-01-25 NOTE — Telephone Encounter (Signed)
Adam Lee, pless are scheduled for a virtual visit with your provider today.   ? ?Just as we do with appointments in the office, we must obtain your consent to participate.  Your consent will be active for this visit and any virtual visit you may have with one of our providers in the next 365 days.   ? ?If you have a MyChart account, I can also send a copy of this consent to you electronically.  All virtual visits are billed to your insurance company just like a traditional visit in the office.  As this is a virtual visit, video technology does not allow for your provider to perform a traditional examination.  This may limit your provider's ability to fully assess your condition.  If your provider identifies any concerns that need to be evaluated in person or the need to arrange testing such as labs, EKG, etc, we will make arrangements to do so.   ? ?Although advances in technology are sophisticated, we cannot ensure that it will always work on either your end or our end.  If the connection with a video visit is poor, we may have to switch to a telephone visit.  With either a video or telephone visit, we are not always able to ensure that we have a secure connection.   I need to obtain your verbal consent now.   Are you willing to proceed with your visit today?  ? ?Chaitanya Amedee. has provided verbal consent on 01/25/2022 for a virtual visit (video or telephone). ? ? ?Vicente Males, LPN ?9/41/7408  1:44 AM ?  ?

## 2022-01-25 NOTE — Progress Notes (Signed)
Patient informed labs ordered. Verbalized understanding. ?

## 2022-01-25 NOTE — Progress Notes (Signed)
Labs ordered in epic

## 2022-01-25 NOTE — Progress Notes (Signed)
? ?  Subjective:  ? ? Patient ID: Adam Lee., male    DOB: Aug 28, 1956, 65 y.o.   MRN: [id number] ? ?HPI ?Patient A1C elevated (8.3) and provider recommend Ozempic or Truliciy per result note. Pt has been checking blood sugar daily sometimes more due to sugars being up and down.  ?Patient suffers with obesity ?Suffers with diabetes under poor control ?Does try to eat relatively healthy ?Is on metformin but cannot go up on the dose ?Ideally we would go down on the dose moving forward ? ?Virtual Visit via Telephone Note ? ?I connected with Adam Lee. on 01/25/22 at  9:20 AM EDT by telephone and verified that I am speaking with the correct person using two identifiers. ? ?Location: ?Patient: home ?Provider: office ?  ?I discussed the limitations, risks, security and privacy concerns of performing an evaluation and management service by telephone and the availability of in person appointments. I also discussed with the patient that there may be a patient responsible charge related to this service. The patient expressed understanding and agreed to proceed. ? ? ?History of Present Illness: ? ?  ?Observations/Objective: ? ? ?Assessment and Plan: ? ? ?Follow Up Instructions: ? ?  ?I discussed the assessment and treatment plan with the patient. The patient was provided an opportunity to ask questions and all were answered. The patient agreed with the plan and demonstrated an understanding of the instructions. ?  ?The patient was advised to call back or seek an in-person evaluation if the symptoms worsen or if the condition fails to improve as anticipated. ? ?I provided  15 minutes of non-face-to-face time during this encounter. ? ? ?  ? ? ?Review of Systems ? ?   ?Objective:  ? Physical Exam ?Today's visit was via telephone ?Physical exam was not possible for this visit ? ?GLP-1 stands the benefit of helping lessen the risk of heart disease for this patient plus also bringing his weight down and getting A1c under  good control he does not have any personal or family history of thyroid cancer. ? ? ?   ?Assessment & Plan:  ?Diabetes subpar control ?Continue metformin ?Start Trulicity ?Side effects were discussed ?Follow-up if progressive troubles ?Recheck in 3 months ?Lab work at that time ?Healthy diet recommended ?If any side effects with medicine notify us ?Start 0.75 once weekly ?Down the road if he does well reduce metformin ? ?

## 2022-01-27 ENCOUNTER — Telehealth: Payer: Self-pay | Admitting: Family Medicine

## 2022-01-27 ENCOUNTER — Other Ambulatory Visit: Payer: Self-pay | Admitting: *Deleted

## 2022-01-27 DIAGNOSIS — E1169 Type 2 diabetes mellitus with other specified complication: Secondary | ICD-10-CM

## 2022-01-27 DIAGNOSIS — I10 Essential (primary) hypertension: Secondary | ICD-10-CM

## 2022-01-27 DIAGNOSIS — E7849 Other hyperlipidemia: Secondary | ICD-10-CM

## 2022-01-27 DIAGNOSIS — Z79899 Other long term (current) drug therapy: Secondary | ICD-10-CM

## 2022-01-27 NOTE — Telephone Encounter (Signed)
Please see message below

## 2022-01-27 NOTE — Telephone Encounter (Signed)
1.  Discontinue Trulicity ?2.  Is patient interested in trying glipizide to see if that would help as well? ?3. irregardless patient needs to do A1c, lipid, met 7 in 3 months with follow-up office visit ?

## 2022-01-27 NOTE — Telephone Encounter (Signed)
Patient informed of md message.  He states he does not wish to start the glipizide. He will try diet and exercise. Patient informed to have labs done before office visit. ?

## 2022-01-27 NOTE — Telephone Encounter (Signed)
Pt called in this morning and states that he did not want to begin on Trulicity due to he is worried about the side effects. Pt states he is going to try to control diabetes with diet and exercise.  ?

## 2022-01-29 ENCOUNTER — Ambulatory Visit (INDEPENDENT_AMBULATORY_CARE_PROVIDER_SITE_OTHER): Payer: Medicare HMO | Admitting: Urology

## 2022-01-29 ENCOUNTER — Encounter: Payer: Self-pay | Admitting: Urology

## 2022-01-29 VITALS — BP 140/81 | HR 83 | Ht 74.0 in | Wt 294.0 lb

## 2022-01-29 DIAGNOSIS — C61 Malignant neoplasm of prostate: Secondary | ICD-10-CM | POA: Diagnosis not present

## 2022-01-29 DIAGNOSIS — N3941 Urge incontinence: Secondary | ICD-10-CM

## 2022-01-29 DIAGNOSIS — R350 Frequency of micturition: Secondary | ICD-10-CM | POA: Diagnosis not present

## 2022-01-29 LAB — URINALYSIS, ROUTINE W REFLEX MICROSCOPIC
Bilirubin, UA: NEGATIVE
Glucose, UA: NEGATIVE
Ketones, UA: NEGATIVE
Nitrite, UA: NEGATIVE
Specific Gravity, UA: >1.03 — ABNORMAL HIGH (ref 1.005–1.030)
Urobilinogen, Ur: 0.2 mg/dL (ref 0.2–1.0)
pH, UA: 5.5 (ref 5.0–7.5)

## 2022-01-29 LAB — MICROSCOPIC EXAMINATION: Renal Epithel, UA: NONE SEEN /hpf

## 2022-01-29 NOTE — Progress Notes (Signed)
? ?01/29/2022 ?9:39 AM  ? ?Adam D Rzasa Jr. ?03/04/1956 ?[id number] ? ?Referring provider: Luking, Scott A, MD ?[address] ?[address] ?[place],  [place] [address] ? ?Followup prostate cancer and urge incontinence ? ? ?HPI: ?Adam Lee is a 66yo here for followup for prostate cancer, urge incontinence and urinary frequency. He is doing monthly PTNS which has significantly improved his frequency and urge incontinence. He has SUI which does not bother him. He uses 2 pads per day. PSA undetectable.  ? ? ?PMH: ?Past Medical History:  ?Diagnosis Date  ? Anginal pain (HCC) 2019  ? Diabetes mellitus without complication (HCC)   ? Dysrhythmia   ? stress related. fast HR  ? History of stress test 2014  ? intermediate risk  ? Hyperlipemia   ? Hypertension   ? Prostate cancer (HCC)   ? ? ?Surgical History: ?Past Surgical History:  ?Procedure Laterality Date  ? COLONOSCOPY N/A 01/03/2020  ? Procedure: COLONOSCOPY;  Surgeon: Rehman, Najeeb U, MD;  [facility];  Service: Endoscopy;  Laterality: N/A;  1055  ? CYSTOSCOPY W/ RETROGRADES Bilateral 09/24/2021  ? Procedure: CYSTOSCOPY WITH PYELOGRAM;  Surgeon: McKenzie, Patrick L, MD;  Location: [facility];  Service: Urology;  Laterality: Bilateral;  ? CYSTOSCOPY WITH BIOPSY N/A 09/24/2021  ? Procedure: CYSTOSCOPY WITH BLADDER BIOPSY;  Surgeon: McKenzie, Patrick L, MD;  Location: [facility];  Service: Urology;  Laterality: N/A;  ? CYSTOSCOPY WITH FULGERATION N/A 09/24/2021  ? Procedure: CYSTOSCOPY WITH FULGERATION;  Surgeon: McKenzie, Patrick L, MD;  [facility]: [facility];  Service: Urology;  Laterality: N/A;  ? HERNIA REPAIR Left   ? 2000, Dr. Mark jenkins  ? LYMPHADENECTOMY Bilateral 01/23/2020  ? Procedure: LYMPHADENECTOMY;  Surgeon: Manny, Theodore, MD;  [facility];  Service: Urology;  Laterality: Bilateral;  ? POLYPECTOMY  01/03/2020  ? Procedure: POLYPECTOMY;  Surgeon: Rehman, Najeeb U, MD;  [facility];  Service: Endoscopy;;  ? ROBOT ASSISTED LAPAROSCOPIC RADICAL  PROSTATECTOMY N/A 01/23/2020  ? Procedure: XI ROBOTIC ASSISTED LAPAROSCOPIC RADICAL PROSTATECTOMY;  Surgeon: Manny, Theodore, MD;  [facility];  Service: Urology;  Laterality: N/A;  3 HRS  ? ? ?Home Medications:  ?Allergies as of 01/29/2022   ?No Known Allergies ?  ? ?  ?Medication List  ?  ? ?  ? Accurate as of January 29, 2022  9:39 AM. If you have any questions, ask your nurse or doctor.  ?  ?  ? ?  ? ?aspirin 81 MG chewable tablet ?Chew 81 mg by mouth daily. ?  ?atorvastatin 10 MG tablet ?Commonly known as: LIPITOR ?Take 1 tablet (10 mg total) by mouth daily. ?  ?blood glucose meter kit and supplies ?Dispense based on patient and insurance preference. Use to check sugars once a day (FOR ICD-10 E10.9, E11.9). ?  ?FISH OIL PO ?Take 1 capsule by mouth in the morning. ?  ?lisinopril 10 MG tablet ?Commonly known as: ZESTRIL ?Take 1 tablet (10 mg total) by mouth daily. NEEDS TO SCHEDULE AN APPOINTMENT BEFORE FURTHER REFILLS ?  ?metFORMIN 500 MG tablet ?Commonly known as: GLUCOPHAGE ?Take 2 tablets (1,000 mg total) by mouth 2 (two) times daily. ?  ?metoprolol tartrate 25 MG tablet ?Commonly known as: LOPRESSOR ?Take 1 tablet (25 mg total) by mouth 2 (two) times daily. ?  ?OneTouch Delica Plus Lancet33G Misc ?USE TO OBTAIN A BLOOD SPECIMEN TO TEST BLOOD SUGAR DAILY ?  ?oxyCODONE-acetaminophen 5-325 MG tablet ?Commonly known as: Percocet ?Take 1 tablet by mouth every 4 (  four) hours as needed for severe pain. ?  ?Trulicity 0.75 MG/0.5ML Sopn ?Generic drug: Dulaglutide ?Inject 0.75 mg into the skin once a week. ?  ? ?  ? ? ?Allergies: No Known Allergies ? ?Family History: ?Family History  ?Problem Relation Age of Onset  ? Diabetes Father   ? Heart disease Mother   ? Cancer Other   ? Breast cancer Neg Hx   ? Prostate cancer Neg Hx   ? Colon cancer Neg Hx   ? Pancreatic cancer Neg Hx   ? ? ?Social History:  reports that he has never smoked. He has never used smokeless tobacco. He reports that he does not drink alcohol and  does not use drugs. ? ?ROS: ?All other review of systems were reviewed and are negative except what is noted above in HPI ? ?Physical Exam: ?BP 140/81   Pulse 83   Ht 6' 2" (1.88 m)   Wt 294 lb (133.4 kg)   BMI 37.75 kg/m?   ?Constitutional:  Alert and oriented, No acute distress. ?HEENT: [place] AT, moist mucus membranes.  Trachea midline, no masses. ?Cardiovascular: No clubbing, cyanosis, or edema. ?Respiratory: Normal respiratory effort, no increased work of breathing. ?GI: Abdomen is soft, nontender, nondistended, no abdominal masses ?GU: No CVA tenderness.  ?Lymph: No cervical or inguinal lymphadenopathy. ?Skin: No rashes, bruises or suspicious lesions. ?Neurologic: Grossly intact, no focal deficits, moving all 4 extremities. ?Psychiatric: Normal mood and affect. ? ?Laboratory Data: ?Lab Results  ?Component Value Date  ? WBC 7.1 01/15/2020  ? HGB 13.9 01/24/2020  ? HCT 43.1 01/24/2020  ? MCV 92.1 01/15/2020  ? PLT 208 01/15/2020  ? ? ?Lab Results  ?Component Value Date  ? CREATININE 1.57 (H) 01/01/2022  ? ? ?Lab Results  ?Component Value Date  ? PSA <0.1 03/04/2020  ? PSA 2.3 12/06/2014  ? ? ?Lab Results  ?Component Value Date  ? TESTOSTERONE 594 04/17/2021  ? ? ?Lab Results  ?Component Value Date  ? HGBA1C 8.3 (H) 01/01/2022  ? ? ?Urinalysis ?   ?Component Value Date/Time  ? APPEARANCEUR Clear 12/18/2021 1206  ? GLUCOSEU Trace (A) 12/18/2021 1206  ? BILIRUBINUR Negative 12/18/2021 1206  ? PROTEINUR Trace (A) 12/18/2021 1206  ? NITRITE Negative 12/18/2021 1206  ? LEUKOCYTESUR 2+ (A) 12/18/2021 1206  ? ? ?Lab Results  ?Component Value Date  ? LABMICR 34.6 01/01/2022  ? WBCUA >30 (A) 12/18/2021  ? LABEPIT None seen 12/18/2021  ? MUCUS Present 12/18/2021  ? BACTERIA Many (A) 12/18/2021  ? ? ?Pertinent Imaging: ? ?No results found for this or any previous visit. ? ?No results found for this or any previous visit. ? ?No results found for this or any previous visit. ? ?No results found for this or any previous  visit. ? ?No results found for this or any previous visit. ? ?No results found for this or any previous visit. ? ?No results found for this or any previous visit. ? ?No results found for this or any previous visit. ? ? ?Assessment & Plan:   ? ?1. Prostate cancer (HCC) ?-RTC 6 months with PSA ?- Urinalysis, Routine w reflex microscopic ? ?2. Urge incontinence ?-continue monthly PTNS ? ?3. Urine frequency ?-Continue monthly PTNS ? ? ?No follow-ups on file. ? ?Patrick McKenzie, MD ? ?[facility] ?  ?

## 2022-01-29 NOTE — Patient Instructions (Signed)

## 2022-02-05 ENCOUNTER — Ambulatory Visit (INDEPENDENT_AMBULATORY_CARE_PROVIDER_SITE_OTHER): Payer: Medicare HMO | Admitting: Physician Assistant

## 2022-02-05 DIAGNOSIS — R350 Frequency of micturition: Secondary | ICD-10-CM

## 2022-02-05 NOTE — Progress Notes (Signed)
PTNS ? ?Session # 14 of 74 ? ?Health & Social Factors: Diabetes ?Caffeine: 2-3 cups ?Alcohol: none ?Daytime voids #per day: 2-3 ?Night-time voids #per night: 2-3 ?Urgency: no ?Incontinence Episodes #per day: none ?Ankle used: left ?Treatment Setting: 5 ?Feeling/ Response: feel in foot ?Comments: nia ? ?Performed By: Durenda Guthrie, LPN ? ?Follow Up: follow up as scheduled.   ?

## 2022-03-05 ENCOUNTER — Ambulatory Visit (INDEPENDENT_AMBULATORY_CARE_PROVIDER_SITE_OTHER): Payer: Medicare HMO | Admitting: Physician Assistant

## 2022-03-05 DIAGNOSIS — R350 Frequency of micturition: Secondary | ICD-10-CM | POA: Diagnosis not present

## 2022-03-05 NOTE — Progress Notes (Signed)
PTNS  Session # 15 of 45  Health & Social Factors: Diabetes Caffeine: 2-3 cups Alcohol: none Daytime voids #per day: 2-3 Night-time voids #per night: 2-3 Urgency: no Incontinence Episodes #per day: none Ankle used: lrft Treatment Setting: 3 Feeling/ Response: positive feeling in foot Comments: n/a  Performed By: Dixon Boos, CNA  Follow Up: Follow up in 1 month

## 2022-04-08 ENCOUNTER — Ambulatory Visit: Payer: Medicare HMO | Admitting: Physician Assistant

## 2022-04-09 ENCOUNTER — Ambulatory Visit: Payer: Medicare HMO | Admitting: Physician Assistant

## 2022-04-13 ENCOUNTER — Ambulatory Visit (INDEPENDENT_AMBULATORY_CARE_PROVIDER_SITE_OTHER): Payer: Medicare HMO

## 2022-04-13 VITALS — Ht 74.0 in | Wt 284.0 lb

## 2022-04-13 DIAGNOSIS — Z0000 Encounter for general adult medical examination without abnormal findings: Secondary | ICD-10-CM

## 2022-04-13 NOTE — Progress Notes (Signed)
Subjective:   Adam Lee. is a 66 y.o. male who presents for an Initial Medicare Annual Wellness Visit. Virtual Visit via Telephone Note  I connected with  Jazir Newey. on 04/13/22 at  9:30 AM EDT by telephone and verified that I am speaking with the correct person using two identifiers.  Location: Patient: HOME Provider: [facility] Persons participating in the virtual visit: patient/Nurse Health Advisor   I discussed the limitations, risks, security and privacy concerns of performing an evaluation and management service by telephone and the availability of in person appointments. The patient expressed understanding and agreed to proceed.  Interactive audio and video telecommunications were attempted between this nurse and patient, however failed, due to patient having technical difficulties OR patient did not have access to video capability.  We continued and completed visit with audio only.  Some vital signs may be absent or patient reported.   Chriss Driver, LPN  Review of Systems     Cardiac Risk Factors include: advanced age (>65mn, >>48women);diabetes mellitus;hypertension;dyslipidemia;male gender;sedentary lifestyle;obesity (BMI >30kg/m2)     Objective:    Today's Vitals   04/13/22 0924  Weight: 284 lb (128.8 kg)  Height: _0  (1.88 m)   Body mass index is 36.46 kg/m.     04/13/2022    9:29 AM 04/07/2021    9:46 AM 10/27/2020    1:33 PM 01/23/2020    7:32 AM 01/15/2020    9:03 AM 01/03/2020    9:37 AM 11/09/2019    1:34 PM  Advanced Directives  Does Patient Have a Medical Advance Directive? _1  No No  Would patient like information on creating a medical advance directive? No - Patient declined No - Patient declined  No - Patient declined  No - Patient declined No - Patient declined    Current Medications (verified) Outpatient Encounter Medications as of 04/13/2022  Medication Sig   aspirin 81 MG chewable tablet Chew 81 mg by mouth daily.    atorvastatin (LIPITOR) 10 MG tablet Take 1 tablet (10 mg total) by mouth daily.   blood glucose meter kit and supplies Dispense based on patient and insurance preference. Use to check sugars once a day (FOR ICD-10 E10.9, E11.9).   Dulaglutide (TRULICITY) 05.52MCE/0.2MVSOPN Inject 0.75 mg into the skin once a week.   Lancets (ONETOUCH DELICA PLUS [id number] MISC USE TO OBTAIN A BLOOD SPECIMEN TO TEST BLOOD SUGAR DAILY   lisinopril (ZESTRIL) 10 MG tablet Take 1 tablet (10 mg total) by mouth daily. NEEDS TO SCHEDULE AN APPOINTMENT BEFORE FURTHER REFILLS   Omega-3 Fatty Acids (FISH OIL PO) Take 1 capsule by mouth in the morning.   oxyCODONE-acetaminophen (PERCOCET) 5-325 MG tablet Take 1 tablet by mouth every 4 (four) hours as needed for severe pain.   metFORMIN (GLUCOPHAGE) 500 MG tablet Take 2 tablets (1,000 mg total) by mouth 2 (two) times daily.   metoprolol tartrate (LOPRESSOR) 25 MG tablet Take 1 tablet (25 mg total) by mouth 2 (two) times daily.   No facility-administered encounter medications on file as of 04/13/2022.    Allergies (verified) Patient has no known allergies.   History: Past Medical History:  Diagnosis Date   [place] [place]   [place]    Dysrhythmia    stress related. fast HR   History of stress test 2014   intermediate risk   Hyperlipemia    Hypertension    Prostate cancer (  [facility])    Past Surgical History:  Procedure Laterality Date   COLONOSCOPY N/A 01/03/2020   Procedure: COLONOSCOPY;  Surgeon: Rogene Houston, MD;  [facility];  Service: Endoscopy;  Laterality: N/A;  [place] Bilateral 09/24/2021   Procedure: CYSTOSCOPY WITH PYELOGRAM;  Surgeon: Cleon Gustin, MD;  Location[facility];  Service: Urology;  Laterality: Bilateral;   CYSTOSCOPY WITH BIOPSY N/A 09/24/2021   Procedure: CYSTOSCOPY WITH BLADDER BIOPSY;  Surgeon: Cleon Gustin, MD;  [facility];  Service:  Urology;  Laterality: N/A;   CYSTOSCOPY WITH FULGERATION N/A 09/24/2021   Procedure: CYSTOSCOPY WITH FULGERATION;  Surgeon: Cleon Gustin, MD;  [facility];  Service: Urology;  Laterality: N/A;   HERNIA REPAIR Left    2000, Dr. Aviva Signs   LYMPHADENECTOMY Bilateral 01/23/2020   Procedure: LYMPHADENECTOMY;  Surgeon: Alexis Frock, MD;  [facility];  Service: Urology;  Laterality: Bilateral;   POLYPECTOMY  01/03/2020   Procedure: POLYPECTOMY;  Surgeon: Rogene Houston, MD;  [facility];  Service: Endoscopy;;   ROBOT ASSISTED LAPAROSCOPIC RADICAL PROSTATECTOMY N/A 01/23/2020   Procedure: XI ROBOTIC ASSISTED LAPAROSCOPIC RADICAL PROSTATECTOMY;  Surgeon: Alexis Frock, MD;  [facility];  Service: Urology;  Laterality: N/A;  3 HRS   Family History  Problem Relation Age of Onset   Diabetes Father    Heart disease Mother    Cancer Other    Breast cancer Neg Hx    Prostate cancer Neg Hx    Colon cancer Neg Hx    Pancreatic cancer Neg Hx    Social History   Socioeconomic History   Marital status: Married    Spouse name: Not on file   Number of children: 2   Years of education: Not on file   Highest education level: Not on file  Occupational History   Not on file  Tobacco Use   Smoking status: Never   Smokeless tobacco: Never  Vaping Use   Vaping Use: Never used  Substance and Sexual Activity   Alcohol use: No    Alcohol/week: 0.0 standard drinks of alcohol   Drug use: No   Sexual activity: Yes  Other Topics Concern   Not on file  Social History Narrative   Not on file   Social Determinants of Health   Financial Resource Strain: Low Risk  (04/13/2022)   Overall Financial Resource Strain (CARDIA)    Difficulty of Paying Living Expenses: Not hard at all  Food Insecurity: No Food Insecurity (04/13/2022)   Hunger Vital Sign    Worried About Running Out of Food in the Last Year: Never true    Ran Out of Food in the Last Year: Never true   Transportation Needs: No Transportation Needs (04/13/2022)   PRAPARE - Hydrologist (Medical): No    Lack of Transportation (Non-Medical): No  Physical Activity: Insufficiently Active (04/13/2022)   Exercise Vital Sign    Days of Exercise per Week: 4 days    Minutes of Exercise per Session: 30 min  Stress: No Stress Concern Present (04/13/2022)   [place]    Feeling of Stress : Not at all  Social Connections: [place] (04/13/2022)   Social Connection and Isolation Panel [NHANES]    Frequency of Communication with Friends and Family: More than three times a week    Frequency of Social Gatherings with Friends and Family:  More than three times a week    Attends Religious Services: 1 to 4 times per year    Active Member of Clubs or Organizations: Yes    Attends Archivist Meetings: 1 to 4 times per year    Marital Status: Married    Tobacco Counseling Counseling given: Not Answered   Clinical Intake:  Pre-visit preparation completed: Yes  Pain : No/denies pain     BMI - recorded: 36.46 Nutritional Status: BMI > 30  Obese Nutritional Risks: None Diabetes: Yes  How often do you need to have someone help you when you read instructions, pamphlets, or other written materials from your doctor or pharmacy?: 1 - Never  Diabetic?Nutrition Risk Assessment:  Has the patient had any N/V/D within the last 2 months?  No  Does the patient have any non-healing wounds?  No  Has the patient had any unintentional weight loss or weight gain?  No   Diabetes:  Is the patient diabetic?  Yes  If diabetic, was a CBG obtained today?  No  Did the patient bring in their glucometer from home?  No  How often do you monitor your CBG's? 3x/day.   Financial Strains and Diabetes Management:  Are you having any financial strains with the device, your supplies or your medication? No .   Does the patient want to be seen by Chronic Care Management for management of their diabetes?  No  Would the patient like to be referred to a Nutritionist or for Diabetic Management?  No   Diabetic Exams:  Diabetic Eye Exam: Completed 2021. Overdue for diabetic eye exam. Pt has been advised about the importance in completing this exam. A referral has been placed today. Message sent to referral coordinator for scheduling purposes. Advised pt to expect a call from office referred to regarding appt.  Diabetic Foot Exam: Completed 03/11/2020. Pt has been advised about the importance in completing this exam.   Interpreter Needed?: No  Information entered by :: mj Yoshi Vicencio, lpn   Activities of Daily Living    04/13/2022    9:31 AM  In your present state of health, do you have any difficulty performing the following activities:  Hearing? 0  Vision? 0  Difficulty concentrating or making decisions? 0  Walking or climbing stairs? 0  Dressing or bathing? 0  Doing errands, shopping? 0  Preparing Food and eating ? N  Using the Toilet? N  In the past six months, have you accidently leaked urine? Y  Comment Do to prostate cancer.  Do you have problems with loss of bowel control? N  Managing your Medications? N  Managing your Finances? N  Housekeeping or managing your Housekeeping? N    Patient Care Team: Kathyrn Drown, MD as PCP - General (Family Medicine) Josue Hector, MD as Consulting Physician (Cardiology)  Indicate any recent Medical Services you may have received from other than [facility] providers in the past year (date may be approximate).     Assessment:   This is a routine wellness examination for Arvind.  Hearing/Vision screen Hearing Screening - Comments:: No hearing issues.  Vision Screening - Comments:: Glasses/Readers. Dr. Jorja Loa. 2021.  Dietary issues and exercise activities discussed: Current Exercise Habits: Home exercise routine, Type of exercise: walking, Time  (Minutes): 30, Frequency (Times/Week): 4, Weekly Exercise (Minutes/Week): 120, Intensity: Mild, Exercise limited by: cardiac condition(s);neurologic condition(s)   Goals Addressed             This Visit's Progress  Exercise 150 min/wk Moderate Activity   On track    Weight (lb) < 230 lb (104.3 kg)   284 lb (128.8 kg)    04/13/22-Continue to lose weight.        Depression Screen    04/13/2022    9:27 AM 04/07/2021    9:48 AM 09/19/2020    3:27 PM 12/05/2018    8:08 AM 09/06/2018    8:21 AM 05/13/2015   10:53 PM  PHQ 2/9 Scores  PHQ - 2 Score 1 0 0 0 0 0    Fall Risk    04/13/2022    9:30 AM 04/07/2021    9:47 AM 09/19/2020    3:27 PM 03/11/2020    8:51 AM 08/14/2019   12:08 PM  Fall Risk   Falls in the past year? 0 1 0 0 0  Number falls in past yr: 0 0     Injury with Fall? 0 0     Risk for fall due to : No Fall Risks No Fall Risks     Follow up Falls prevention discussed Falls evaluation completed;Falls prevention discussed Falls evaluation completed Falls evaluation completed Falls evaluation completed    FALL RISK PREVENTION PERTAINING TO THE HOME:  Any stairs in or around the home? Yes  If so, are there any without handrails? No  Home free of loose throw rugs in walkways, pet beds, electrical cords, etc? Yes  Adequate lighting in your home to reduce risk of falls? Yes   ASSISTIVE DEVICES UTILIZED TO PREVENT FALLS:  Life alert? No  Use of a cane, walker or w/c? No  Grab bars in the bathroom? Yes  Shower chair or bench in shower? No  Elevated toilet seat or a handicapped toilet? Yes   TIMED UP AND GO:  Was the test performed? No .  Phone visit  Cognitive Function:        04/13/2022    9:31 AM  6CIT Screen  What Year? 0 points  What month? 0 points  What time? 0 points  Count back from 20 0 points  Months in reverse 0 points  Repeat phrase 0 points  Total Score 0 points    Immunizations Immunization History  Administered Date(s) Administered    Pneumococcal Conjugate-13 12/05/2018   Pneumococcal Polysaccharide-23 03/11/2020    TDAP status: Due, Education has been provided regarding the importance of this vaccine. Advised may receive this vaccine at local pharmacy or Health Dept. Aware to provide a copy of the vaccination record if obtained from local pharmacy or Health Dept. Verbalized acceptance and understanding.  Flu Vaccine status: Due, Education has been provided regarding the importance of this vaccine. Advised may receive this vaccine at local pharmacy or Health Dept. Aware to provide a copy of the vaccination record if obtained from local pharmacy or Health Dept. Verbalized acceptance and understanding.  Pneumococcal vaccine status: Up to date  Covid-19 vaccine status: Declined, Education has been provided regarding the importance of this vaccine but patient still declined. Advised may receive this vaccine at local pharmacy or Health Dept.or vaccine clinic. Aware to provide a copy of the vaccination record if obtained from local pharmacy or Health Dept. Verbalized acceptance and understanding.  Qualifies for Shingles Vaccine? Yes   Zostavax completed No   Shingrix Completed?: No.    Education has been provided regarding the importance of this vaccine. Patient has been advised to call insurance company to determine out of pocket expense if they have not yet received  this vaccine. Advised may also receive vaccine at local pharmacy or Health Dept. Verbalized acceptance and understanding.  Screening Tests Health Maintenance  Topic Date Due   FOOT EXAM  03/11/2021   OPHTHALMOLOGY EXAM  10/01/2022 (Originally 08/15/2015)   TETANUS/TDAP  10/01/2022 (Originally 10/23/1974)   Zoster Vaccines- Shingrix (1 of 2) 10/01/2022 (Originally 10/23/1974)   HEMOGLOBIN A1C  07/03/2022   COLONOSCOPY (Pts 45-79yr Insurance coverage will need to be confirmed)  01/03/2023   Pneumonia Vaccine 66 Years old  Completed   Hepatitis C Screening   Completed   HPV VACCINES  Aged Out   INFLUENZA VACCINE  Discontinued   COVID-19 Vaccine  Discontinued    Health Maintenance  Health Maintenance Due  Topic Date Due   FOOT EXAM  03/11/2021    Colorectal cancer screening: Type of screening: Colonoscopy. Completed 01/03/2020. Repeat every 3 years  Lung Cancer Screening: (Low Dose CT Chest recommended if Age 68-80years, 30 pack-year currently smoking OR have quit w/in 15years.) does qualify.   Lung Cancer Screening Referral: Chest CT done 10/16/2015.  Additional Screening:  Hepatitis C Screening: does qualify; Completed 12/05/2018  Vision Screening: Recommended annual ophthalmology exams for early detection of glaucoma and other disorders of the eye. Is the patient up to date with their annual eye exam?  No  Who is the provider or what is the name of the office in which the patient attends annual eye exams? Dr. CJorja LoaIf pt is not established with a provider, would they like to be referred to a provider to establish care? No .   Dental Screening: Recommended annual dental exams for proper oral hygiene  Community Resource Referral / Chronic Care Management: CRR required this visit?  No   CCM required this visit?  No      Plan:     I have personally reviewed and noted the following in the patient's chart:   Medical and social history Use of alcohol, tobacco or illicit drugs  Current medications and supplements including opioid prescriptions. Patient is currently taking opioid prescriptions. Information provided to patient regarding non-opioid alternatives. Patient advised to discuss non-opioid treatment plan with their provider. Functional ability and status Nutritional status Physical activity Advanced directives List of other physicians Hospitalizations, surgeries, and ER visits in previous 12 months Vitals Screenings to include cognitive, depression, and falls Referrals and appointments  In addition, I have reviewed  and discussed with patient certain preventive protocols, quality metrics, and best practice recommendations. A written personalized care plan for preventive services as well as general preventive health recommendations were provided to patient.     MChriss Driver LPN   71/66/0630  Nurse Notes: Discussed scheduling eye exam. Pt states he will call this week. Discussed Shingrix and how to obtain.

## 2022-04-13 NOTE — Patient Instructions (Signed)
Adam Lee , Thank you for taking time to come for your Medicare Wellness Visit. I appreciate your ongoing commitment to your health goals. Please review the following plan we discussed and let me know if I can assist you in the future.   Screening recommendations/referrals: Colonoscopy: Done 01/03/2020. Repeat in 2024.  Recommended yearly ophthalmology/optometry visit for glaucoma screening and checkup Recommended yearly dental visit for hygiene and checkup  Vaccinations: Influenza vaccine: Due Fall 2023. Pneumococcal vaccine: Done 12/05/2018 and 03/11/2020. Tdap vaccine: Due every 10 years. Shingles vaccine: Discussed.    Covid-19: Declined.  Advanced directives: Advance directive discussed with you today. Even though you declined this today, please call our office should you change your mind, and we can give you the proper paperwork for you to fill out.   Conditions/risks identified: Aim for 30 minutes of exercise or brisk walking, 6-8 glasses of water, and 5 servings of fruits and vegetables each day.   Next appointment: Follow up in one year for your annual wellness visit. 2024.  Preventive Care 68 Years and Older, Male  Preventive care refers to lifestyle choices and visits with your health care provider that can promote health and wellness. What does preventive care include? A yearly physical exam. This is also called an annual well check. Dental exams once or twice a year. Routine eye exams. Ask your health care provider how often you should have your eyes checked. Personal lifestyle choices, including: Daily care of your teeth and gums. Regular physical activity. Eating a healthy diet. Avoiding tobacco and drug use. Limiting alcohol use. Practicing safe sex. Taking low doses of aspirin every day. Taking vitamin and mineral supplements as recommended by your health care provider. What happens during an annual well check? The services and screenings done by your health care  provider during your annual well check will depend on your age, overall health, lifestyle risk factors, and family history of disease. Counseling  Your health care provider may ask you questions about your: Alcohol use. Tobacco use. Drug use. Emotional well-being. Home and relationship well-being. Sexual activity. Eating habits. History of falls. Memory and ability to understand (cognition). Work and work Statistician. Screening  You may have the following tests or measurements: Height, weight, and BMI. Blood pressure. Lipid and cholesterol Lee. These may be checked every 5 years, or more frequently if you are over 71 years old. Skin check. Lung cancer screening. You may have this screening every year starting at age 91 if you have a 30-pack-year history of smoking and currently smoke or have quit within the past 15 years. Fecal occult blood test (FOBT) of the stool. You may have this test every year starting at age 60. Flexible sigmoidoscopy or colonoscopy. You may have a sigmoidoscopy every 5 years or a colonoscopy every 10 years starting at age 79. Prostate cancer screening. Recommendations will vary depending on your family history and other risks. Hepatitis C blood test. Hepatitis B blood test. Sexually transmitted disease (STD) testing. Diabetes screening. This is done by checking your blood sugar (glucose) after you have not eaten for a while (fasting). You may have this done every 1-3 years. Abdominal aortic aneurysm (AAA) screening. You may need this if you are a current or former smoker. Osteoporosis. You may be screened starting at age 48 if you are at high risk. Talk with your health care provider about your test results, treatment options, and if necessary, the need for more tests. Vaccines  Your health care provider may recommend certain  vaccines, such as: Influenza vaccine. This is recommended every year. Tetanus, diphtheria, and acellular pertussis (Tdap, Td)  vaccine. You may need a Td booster every 10 years. Zoster vaccine. You may need this after age 50. Pneumococcal 13-valent conjugate (PCV13) vaccine. One dose is recommended after age 110. Pneumococcal polysaccharide (PPSV23) vaccine. One dose is recommended after age 13. Talk to your health care provider about which screenings and vaccines you need and how often you need them. This information is not intended to replace advice given to you by your health care provider. Make sure you discuss any questions you have with your health care provider. Document Released: 10/17/2015 Document Revised: 06/09/2016 Document Reviewed: 07/22/2015 Elsevier Interactive Patient Education  2017 [employer] Prevention in the Home Falls can cause injuries. They can happen to people of all ages. There are many things you can do to make your home safe and to help prevent falls. What can I do on the outside of my home? Regularly fix the edges of walkways and driveways and fix any cracks. Remove anything that might make you trip as you walk through a door, such as a raised step or threshold. Trim any bushes or trees on the path to your home. Use bright outdoor lighting. Clear any walking paths of anything that might make someone trip, such as rocks or tools. Regularly check to see if handrails are loose or broken. Make sure that both sides of any steps have handrails. Any raised decks and porches should have guardrails on the edges. Have any leaves, snow, or ice cleared regularly. Use sand or salt on walking paths during winter. Clean up any spills in your garage right away. This includes oil or grease spills. What can I do in the bathroom? Use night lights. Install grab bars by the toilet and in the tub and shower. Do not use towel bars as grab bars. Use non-skid mats or decals in the tub or shower. If you need to sit down in the shower, use a plastic, non-slip stool. Keep the floor dry. Clean up any  water that spills on the floor as soon as it happens. Remove soap buildup in the tub or shower regularly. Attach bath mats securely with double-sided non-slip rug tape. Do not have throw rugs and other things on the floor that can make you trip. What can I do in the bedroom? Use night lights. Make sure that you have a light by your bed that is easy to reach. Do not use any sheets or blankets that are too big for your bed. They should not hang down onto the floor. Have a firm chair that has side arms. You can use this for support while you get dressed. Do not have throw rugs and other things on the floor that can make you trip. What can I do in the kitchen? Clean up any spills right away. Avoid walking on wet floors. Keep items that you use a lot in easy-to-reach places. If you need to reach something above you, use a strong step stool that has a grab bar. Keep electrical cords out of the way. Do not use floor polish or wax that makes floors slippery. If you must use wax, use non-skid floor wax. Do not have throw rugs and other things on the floor that can make you trip. What can I do with my stairs? Do not leave any items on the stairs. Make sure that there are handrails on both sides of the stairs  and use them. Fix handrails that are broken or loose. Make sure that handrails are as long as the stairways. Check any carpeting to make sure that it is firmly attached to the stairs. Fix any carpet that is loose or worn. Avoid having throw rugs at the top or bottom of the stairs. If you do have throw rugs, attach them to the floor with carpet tape. Make sure that you have a light switch at the top of the stairs and the bottom of the stairs. If you do not have them, ask someone to add them for you. What else can I do to help prevent falls? Wear shoes that: Do not have high heels. Have rubber bottoms. Are comfortable and fit you well. Are closed at the toe. Do not wear sandals. If you use a  stepladder: Make sure that it is fully opened. Do not climb a closed stepladder. Make sure that both sides of the stepladder are locked into place. Ask someone to hold it for you, if possible. Clearly mark and make sure that you can see: Any grab bars or handrails. First and last steps. Where the edge of each step is. Use tools that help you move around (mobility aids) if they are needed. These include: Canes. Walkers. Scooters. Crutches. Turn on the lights when you go into a dark area. Replace any light bulbs as soon as they burn out. Set up your furniture so you have a clear path. Avoid moving your furniture around. If any of your floors are uneven, fix them. If there are any pets around you, be aware of where they are. Review your medicines with your doctor. Some medicines can make you feel dizzy. This can increase your chance of falling. Ask your doctor what other things that you can do to help prevent falls. This information is not intended to replace advice given to you by your health care provider. Make sure you discuss any questions you have with your health care provider. Document Released: 07/17/2009 Document Revised: 02/26/2016 Document Reviewed: 10/25/2014 Elsevier Interactive Patient Education  2017 [employer].

## 2022-06-25 ENCOUNTER — Other Ambulatory Visit: Payer: Self-pay | Admitting: Family Medicine

## 2022-06-25 DIAGNOSIS — E7849 Other hyperlipidemia: Secondary | ICD-10-CM

## 2022-07-01 NOTE — Progress Notes (Signed)
[facility] Quality Team Note  Name: Adam Lee. Date of Birth: 10/09/1955 MRN: [id number] Date: 07/01/2022  [facility] Quality Team has reviewed this patient's chart, please see recommendations below:  Diabetic Retinal Eye Exam; Patient requests [facility] Quality Coordinator to schedule Diabetic Retinal Screening at [employer] [facility] eye event 07/08/2022, 4:00P this is a combined event for [facility] patients to attend).

## 2022-07-08 LAB — HM DIABETES EYE EXAM

## 2022-07-23 ENCOUNTER — Other Ambulatory Visit: Payer: Medicare HMO

## 2022-07-23 DIAGNOSIS — C61 Malignant neoplasm of prostate: Secondary | ICD-10-CM

## 2022-07-24 LAB — PSA: Prostate Specific Ag, Serum: <0.1 ng/mL (ref 0.0–4.0)

## 2022-07-30 ENCOUNTER — Encounter: Payer: Self-pay | Admitting: Urology

## 2022-07-30 ENCOUNTER — Ambulatory Visit (INDEPENDENT_AMBULATORY_CARE_PROVIDER_SITE_OTHER): Payer: Medicare HMO | Admitting: Urology

## 2022-07-30 VITALS — BP 129/80 | HR 90

## 2022-07-30 DIAGNOSIS — C61 Malignant neoplasm of prostate: Secondary | ICD-10-CM

## 2022-07-30 DIAGNOSIS — R350 Frequency of micturition: Secondary | ICD-10-CM

## 2022-07-30 DIAGNOSIS — N3941 Urge incontinence: Secondary | ICD-10-CM | POA: Diagnosis not present

## 2022-07-30 LAB — URINALYSIS, ROUTINE W REFLEX MICROSCOPIC
Bilirubin, UA: NEGATIVE
Ketones, UA: NEGATIVE
Leukocytes,UA: NEGATIVE
Nitrite, UA: NEGATIVE
Protein,UA: NEGATIVE
RBC, UA: NEGATIVE
Specific Gravity, UA: 1.02 (ref 1.005–1.030)
Urobilinogen, Ur: 0.2 mg/dL (ref 0.2–1.0)
pH, UA: 7 (ref 5.0–7.5)

## 2022-07-30 NOTE — Patient Instructions (Signed)

## 2022-07-30 NOTE — Progress Notes (Unsigned)
07/30/2022 10:30 AM   Adam Kanner Jr. 09-08-1956 [id number]  Referring provider: Kathyrn Drown, MD [place],  [place] [id number]  Followup prostate cancer and OAB   HPI: [name] is a 66yo here for followup for OAb and prostate cancer. PSA undetectable. He continues to have urge incontinence and uses 2 pads during day and 1 at night. He did PTNS which failed to improve his urge incontinence.    PMH: Past Medical History:  Diagnosis Date   Anginal pain ([place]) 2019   Diabetes mellitus without complication ([place])    Dysrhythmia    stress related. fast HR   History of stress test 2014   intermediate risk   Hyperlipemia    Hypertension    Prostate cancer [facility])     Surgical History: Past Surgical History:  Procedure Laterality Date   COLONOSCOPY N/A 01/03/2020   Procedure: COLONOSCOPY;  Surgeon: Rogene Houston, MD;  Location[facility];  Service: Endoscopy;  Laterality: N/A;  [place] Bilateral 09/24/2021   Procedure: CYSTOSCOPY WITH PYELOGRAM;  Surgeon: Cleon Gustin, MD;  Location: [facility] ORS;  Service: Urology;  Laterality: Bilateral;   CYSTOSCOPY WITH BIOPSY N/A 09/24/2021   Procedure: CYSTOSCOPY WITH BLADDER BIOPSY;  Surgeon: Cleon Gustin, MD;  Location: [facility] ORS;  Service: Urology;  Laterality: N/A;   CYSTOSCOPY WITH FULGERATION N/A 09/24/2021   Procedure: CYSTOSCOPY WITH FULGERATION;  Surgeon: Cleon Gustin, MD;  Location: [facility] ORS;  Service: Urology;  Laterality: N/A;   HERNIA REPAIR Left    2000, Dr. Aviva Signs   LYMPHADENECTOMY Bilateral 01/23/2020   Procedure: LYMPHADENECTOMY;  Surgeon: Alexis Frock, MD;  Location: [facility] ORS;  Service: Urology;  Laterality: Bilateral;   POLYPECTOMY  01/03/2020   Procedure: POLYPECTOMY;  Surgeon: Rogene Houston, MD;  Location: [facility];  Service: Endoscopy;;   ROBOT ASSISTED LAPAROSCOPIC RADICAL PROSTATECTOMY N/A 01/23/2020   Procedure: XI ROBOTIC ASSISTED  LAPAROSCOPIC RADICAL PROSTATECTOMY;  Surgeon: Alexis Frock, MD;  Location: [facility] ORS;  Service: Urology;  Laterality: N/A;  3 HRS    Home Medications:  Allergies as of 07/30/2022   No Known Allergies      Medication List        Accurate as of July 30, 2022 10:30 AM. If you have any questions, ask your nurse or doctor.          aspirin 81 MG chewable tablet Chew 81 mg by mouth daily.   atorvastatin 10 MG tablet Commonly known as: LIPITOR TAKE 1 TABLET BY MOUTH EVERY DAY   blood glucose meter kit and supplies Dispense based on patient and insurance preference. Use to check sugars once a day (FOR ICD-10 E10.9, E11.9).   FISH OIL PO Take 1 capsule by mouth in the morning.   lisinopril 10 MG tablet Commonly known as: ZESTRIL Take 1 tablet (10 mg total) by mouth daily. NEEDS TO SCHEDULE AN APPOINTMENT BEFORE FURTHER REFILLS   metFORMIN 500 MG tablet Commonly known as: GLUCOPHAGE Take 2 tablets (1,000 mg total) by mouth 2 (two) times daily.   metoprolol tartrate 25 MG tablet Commonly known as: LOPRESSOR Take 1 tablet (25 mg total) by mouth 2 (two) times daily.   OneTouch Delica Plus [id number] Misc USE TO OBTAIN A BLOOD SPECIMEN TO TEST BLOOD SUGAR DAILY   oxyCODONE-acetaminophen 5-325 MG tablet Commonly known as: Percocet Take 1 tablet by mouth every 4 (four) hours as needed for severe pain.  Trulicity 7.44 ZH/4.6IQ Sopn Generic drug: Dulaglutide Inject 0.75 mg into the skin once a week.        Allergies: No Known Allergies  Family History: Family History  Problem Relation Age of Onset   Diabetes Father    Heart disease Mother    Cancer Other    Breast cancer Neg Hx    Prostate cancer Neg Hx    Colon cancer Neg Hx    Pancreatic cancer Neg Hx     Social History:  reports that he has never smoked. He has never used smokeless tobacco. He reports that he does not drink alcohol and does not use drugs.  ROS: All other review of systems were reviewed  and are negative except what is noted above in HPI  Physical Exam: BP 129/80   Pulse 90   Constitutional:  Alert and oriented, No acute distress. HEENT: [place] AT, moist mucus membranes.  Trachea midline, no masses. Cardiovascular: No clubbing, cyanosis, or edema. Respiratory: Normal respiratory effort, no increased work of breathing. GI: Abdomen is soft, nontender, nondistended, no abdominal masses GU: No CVA tenderness.  Lymph: No cervical or inguinal lymphadenopathy. Skin: No rashes, bruises or suspicious lesions. Neurologic: Grossly intact, no focal deficits, moving all 4 extremities. Psychiatric: Normal mood and affect.  Laboratory Data: Lab Results  Component Value Date   WBC 7.1 01/15/2020   HGB 13.9 01/24/2020   HCT 43.1 01/24/2020   MCV 92.1 01/15/2020   PLT 208 01/15/2020    Lab Results  Component Value Date   CREATININE 1.57 (H) 01/01/2022    Lab Results  Component Value Date   PSA <0.1 03/04/2020   PSA 2.3 12/06/2014    Lab Results  Component Value Date   TESTOSTERONE 594 04/17/2021    Lab Results  Component Value Date   HGBA1C 8.3 (H) 01/01/2022    Urinalysis    Component Value Date/Time   APPEARANCEUR Clear 01/29/2022 1119   GLUCOSEU Negative 01/29/2022 1119   BILIRUBINUR Negative 01/29/2022 1119   PROTEINUR 1+ (A) 01/29/2022 1119   NITRITE Negative 01/29/2022 1119   LEUKOCYTESUR 1+ (A) 01/29/2022 1119    Lab Results  Component Value Date   LABMICR See below: 01/29/2022   WBCUA 11-30 (A) 01/29/2022   LABEPIT 0-10 01/29/2022   MUCUS Present 12/18/2021   BACTERIA Few 01/29/2022    Pertinent Imaging:  No results found for this or any previous visit.  No results found for this or any previous visit.  No results found for this or any previous visit.  No results found for this or any previous visit.  No results found for this or any previous visit.  No valid procedures specified. No results found for this or any previous visit.  No  results found for this or any previous visit.   Assessment & Plan:    1. Prostate cancer (Adam Lee) -RTC 6 months with PSA - Urinalysis, Routine w reflex microscopic  2. Urine frequency -patient defers therapy at this time  3. Urge incontinence -patient defers therapy at this time.    No follow-ups on file.  Nicolette Bang, MD  [facility] [place]

## 2022-09-01 ENCOUNTER — Telehealth: Payer: Self-pay

## 2022-09-01 DIAGNOSIS — E7849 Other hyperlipidemia: Secondary | ICD-10-CM

## 2022-09-01 DIAGNOSIS — E1169 Type 2 diabetes mellitus with other specified complication: Secondary | ICD-10-CM

## 2022-09-01 DIAGNOSIS — R7989 Other specified abnormal findings of blood chemistry: Secondary | ICD-10-CM

## 2022-09-01 DIAGNOSIS — I10 Essential (primary) hypertension: Secondary | ICD-10-CM

## 2022-09-01 NOTE — Telephone Encounter (Signed)
Lipid, liver, metabolic 7, urine ACR, J4H Diabetes hyperlipidemia hypertension His appointment is December 8 thank you

## 2022-09-01 NOTE — Telephone Encounter (Signed)
Lab orders placed. Attempted to contact patient but no voicemail set up at this time

## 2022-09-10 ENCOUNTER — Ambulatory Visit (INDEPENDENT_AMBULATORY_CARE_PROVIDER_SITE_OTHER): Payer: Medicare HMO | Admitting: Family Medicine

## 2022-09-10 ENCOUNTER — Encounter: Payer: Self-pay | Admitting: Family Medicine

## 2022-09-10 VITALS — BP 132/86 | HR 77 | Temp 97.7°F | Ht 74.0 in | Wt 291.0 lb

## 2022-09-10 DIAGNOSIS — I10 Essential (primary) hypertension: Secondary | ICD-10-CM | POA: Diagnosis not present

## 2022-09-10 DIAGNOSIS — E7849 Other hyperlipidemia: Secondary | ICD-10-CM

## 2022-09-10 DIAGNOSIS — E1169 Type 2 diabetes mellitus with other specified complication: Secondary | ICD-10-CM | POA: Diagnosis not present

## 2022-09-10 DIAGNOSIS — C61 Malignant neoplasm of prostate: Secondary | ICD-10-CM | POA: Diagnosis not present

## 2022-09-10 MED ORDER — ATORVASTATIN CALCIUM 10 MG PO TABS: 10.0000 mg | ORAL_TABLET | Freq: Every day | ORAL | 1 refills | Status: DC

## 2022-09-10 MED ORDER — LISINOPRIL 10 MG PO TABS: 10.0000 mg | ORAL_TABLET | Freq: Every day | ORAL | 1 refills | Status: DC

## 2022-09-10 NOTE — Progress Notes (Signed)
Subjective:    Patient ID: Adam Lee., male    DOB: 1956/02/08, 66 y.o.   MRN: [id number]  HPI Type 2 diabetes mellitus with other specified complication, without long-term current use of insulin (HCC)  Other hyperlipidemia - Plan: atorvastatin (LIPITOR) 10 MG tablet  Prostate cancer ([place])  Morbid obesity ([place])  The patient was seen today as part of a comprehensive diabetic check up. Patient has diabetes Patient relates good compliance with taking the medication. We discussed their diet and exercise activities  We also discussed the importance of notifying Adam Lee if any excessively high glucoses or low sugars.    Patient here for follow-up regarding cholesterol.    Patient relates taking medication on a regular basis Denies problems with medication Importance of dietary measures discussed Regular lab work regarding lipid and liver was checked and if needing additional labs was appropriately ordered  The patient's BMI is calculated.  The patient does have obesity.  The patient does try to some degree staying active and watching diet.  It is in the vital signs and acknowledged.  It is above the recommended BMI for the patient's height and weight.  The patient has been counseled regarding healthy diet, restricted portions, avoiding excessive carbohydrates/sugary foods, and increase physical activity as health permits.  It is in the patient's best interest to lower the risk of secondary illness including heart disease strokes and cancer by losing weight.  The patient acknowledges this information.  Patient also has prostate cancer being followed by specialist.  Has been treated with surgery.   Review of Systems     Objective:   Physical Exam General-in no acute distress Eyes-no discharge Lungs-respiratory rate normal, CTA CV-no murmurs,RRR Extremities skin warm dry no edema Neuro grossly normal Behavior normal, alert        Assessment & Plan:  1. Other  hyperlipidemia Hyperlipidemia-importance of diet, weight control, activity, compliance with medications discussed.   Recent labs reviewed.   Any additional labs or refills ordered.   Importance of keeping under good control discussed. Regular follow-up visits discussed The patient was seen today as part of a comprehensive visit for diabetes. The importance of keeping her A1c at or below 7 range was discussed.  Discussed diet, activity, and medication compliance Emphasized healthy eating primarily with vegetables fruits and if utilizing meats lean meats such as chicken or fish grilled baked broiled Avoid sugary drinks Minimize and avoid processed foods Fit in regular physical activity preferably 25 to 30 minutes 4 times per week Standard follow-up visit recommended.  Patient aware lack of control and follow-up increases risk of diabetic complications. Regular follow-up visits Yearly ophthalmology Yearly foot exam  - atorvastatin (LIPITOR) 10 MG tablet; Take 1 tablet (10 mg total) by mouth daily.  Dispense: 90 tablet; Refill: 1  2. Type 2 diabetes mellitus with other specified complication, without long-term current use of insulin ([name]) The patient was seen today as part of a comprehensive visit for diabetes. The importance of keeping her A1c at or below 7 range was discussed.  Discussed diet, activity, and medication compliance Emphasized healthy eating primarily with vegetables fruits and if utilizing meats lean meats such as chicken or fish grilled baked broiled Avoid sugary drinks Minimize and avoid processed foods Fit in regular physical activity preferably 25 to 30 minutes 4 times per week Standard follow-up visit recommended.  Patient aware lack of control and follow-up increases risk of diabetic complications. Regular follow-up visits Yearly ophthalmology Yearly foot exam  He  has not had lab work done recently.  Very important to get this done.  May need to adjust  medicine. Currently doing metformin 1 tablet twice daily Does not want to use GLP-1 medicines currently   3. Prostate cancer Adam Lee) Patient being followed by specialist.  Having some urinary issues wears diapers  4. Morbid obesity ([name]) Portion control regular physical activity  Patient defers on vaccines

## 2022-09-12 LAB — BASIC METABOLIC PANEL
BUN/Creatinine Ratio: 11 (ref 10–24)
BUN: 18 mg/dL (ref 8–27)
CO2: 27 mmol/L (ref 20–29)
Calcium: 9.3 mg/dL (ref 8.6–10.2)
Chloride: 97 mmol/L (ref 96–106)
Creatinine, Ser: 1.71 mg/dL — ABNORMAL HIGH (ref 0.76–1.27)
Glucose: 160 mg/dL — ABNORMAL HIGH (ref 70–99)
Potassium: 4.5 mmol/L (ref 3.5–5.2)
Sodium: 138 mmol/L (ref 134–144)
eGFR: 43 mL/min/{1.73_m2} — ABNORMAL LOW (ref >59)

## 2022-09-12 LAB — HEPATIC FUNCTION PANEL
ALT: 18 IU/L (ref 0–44)
AST: 21 IU/L (ref 0–40)
Albumin: 4.5 g/dL (ref 3.9–4.9)
Alkaline Phosphatase: 80 IU/L (ref 44–121)
Bilirubin Total: 0.4 mg/dL (ref 0.0–1.2)
Bilirubin, Direct: 0.13 mg/dL (ref 0.00–0.40)
Total Protein: 7 g/dL (ref 6.0–8.5)

## 2022-09-12 LAB — LIPID PANEL
Chol/HDL Ratio: 2.3 ratio (ref 0.0–5.0)
Cholesterol, Total: 123 mg/dL (ref 100–199)
HDL: 54 mg/dL (ref >39)
LDL Chol Calc (NIH): 51 mg/dL (ref 0–99)
Triglycerides: 93 mg/dL (ref 0–149)
VLDL Cholesterol Cal: 18 mg/dL (ref 5–40)

## 2022-09-12 LAB — HEMOGLOBIN A1C
Est. average glucose Bld gHb Est-mCnc: 192 mg/dL
Hgb A1c MFr Bld: 8.3 % — ABNORMAL HIGH (ref 4.8–5.6)

## 2022-09-12 LAB — MICROALBUMIN / CREATININE URINE RATIO
Creatinine, Urine: 178 mg/dL
Microalb/Creat Ratio: 33 mg/g creat — ABNORMAL HIGH (ref 0–29)
Microalbumin, Urine: 58.3 ug/mL

## 2022-09-14 NOTE — Telephone Encounter (Signed)
Labs completed by patient

## 2022-09-17 ENCOUNTER — Other Ambulatory Visit: Payer: Self-pay | Admitting: Family Medicine

## 2022-09-17 DIAGNOSIS — E7849 Other hyperlipidemia: Secondary | ICD-10-CM

## 2022-09-20 MED ORDER — METFORMIN HCL 500 MG PO TABS: 500.0000 mg | ORAL_TABLET | Freq: Two times a day (BID) | ORAL | 0 refills | Status: DC

## 2022-09-20 NOTE — Addendum Note (Signed)
Addended by: Dairl Ponder on: 09/20/2022 10:06 AM   Modules accepted: Orders

## 2022-09-23 MED ORDER — GLIPIZIDE ER 5 MG PO TB24: 5.0000 mg | ORAL_TABLET | Freq: Every day | ORAL | 4 refills | Status: DC

## 2022-09-23 NOTE — Addendum Note (Signed)
Addended by: Dairl Ponder on: 09/23/2022 09:09 AM   Modules accepted: Orders

## 2022-10-20 ENCOUNTER — Encounter: Payer: Self-pay | Admitting: *Deleted

## 2022-11-04 ENCOUNTER — Other Ambulatory Visit: Payer: Self-pay | Admitting: Family Medicine

## 2022-11-24 ENCOUNTER — Ambulatory Visit (INDEPENDENT_AMBULATORY_CARE_PROVIDER_SITE_OTHER): Payer: Medicare HMO | Admitting: Family Medicine

## 2022-11-24 VITALS — BP 116/77 | Wt 282.0 lb

## 2022-11-24 DIAGNOSIS — K625 Hemorrhage of anus and rectum: Secondary | ICD-10-CM | POA: Diagnosis not present

## 2022-11-24 LAB — POCT HEMOGLOBIN: Hemoglobin: 11.5 g/dL (ref 11–14.6)

## 2022-11-24 NOTE — Progress Notes (Signed)
He has not  Subjective:    Patient ID: Adam Fusi., male    DOB: 1955/12/11, 67 y.o.   MRN: [id number]  HPI Patient arrives today having constipation patient states the stool is mucoid soft and bloody x 7 days.  Patient relates that he felt like he was having constipation and he states he is somewhat strain but that he started having stools and had blood and mucus mixed in with it.  He states is been very soft bowel movements he feels like he has to go but he cannot denies any abdominal pain denies high fever chills.  He had a previous colonoscopy which showed polyps but no growth  Review of Systems     Objective:   Physical Exam  Lungs clear heart regular abdomen soft no masses or tenderness rectal exam is normal with soft stool Hemoccult was negative      Assessment & Plan:  Patient with some constipation issues but then had soft stools with blood mixed in with it then a large amount of congealed blood he has had a previous colonoscopy which showed polyps Rectal Sam soft bowel movement no impaction negative Hemoccult Hemoglobin is 11.5 We will reach out to gastroenterology to get him referred and probably scheduled for colonoscopy We will reach out to gastroenterology after CBC is back

## 2022-11-25 ENCOUNTER — Other Ambulatory Visit: Payer: Self-pay

## 2022-11-25 DIAGNOSIS — K625 Hemorrhage of anus and rectum: Secondary | ICD-10-CM

## 2022-11-25 LAB — CBC WITH DIFFERENTIAL/PLATELET
Basophils Absolute: 0 10*3/uL (ref 0.0–0.2)
Basos: 1 %
EOS (ABSOLUTE): 0.2 10*3/uL (ref 0.0–0.4)
Eos: 3 %
Hematocrit: 41.3 % (ref 37.5–51.0)
Hemoglobin: 13.6 g/dL (ref 13.0–17.7)
Immature Grans (Abs): 0 10*3/uL (ref 0.0–0.1)
Immature Granulocytes: 0 %
Lymphocytes Absolute: 1 10*3/uL (ref 0.7–3.1)
Lymphs: 13 %
MCH: 28.9 pg (ref 26.6–33.0)
MCHC: 32.9 g/dL (ref 31.5–35.7)
MCV: 88 fL (ref 79–97)
Monocytes Absolute: 0.9 10*3/uL (ref 0.1–0.9)
Monocytes: 11 %
Neutrophils Absolute: 5.8 10*3/uL (ref 1.4–7.0)
Neutrophils: 72 %
Platelets: 280 10*3/uL (ref 150–450)
RBC: 4.7 x10E6/uL (ref 4.14–5.80)
RDW: 12.6 % (ref 11.6–15.4)
WBC: 8 10*3/uL (ref 3.4–10.8)

## 2022-12-06 ENCOUNTER — Telehealth (INDEPENDENT_AMBULATORY_CARE_PROVIDER_SITE_OTHER): Payer: Self-pay | Admitting: Gastroenterology

## 2022-12-06 ENCOUNTER — Ambulatory Visit (INDEPENDENT_AMBULATORY_CARE_PROVIDER_SITE_OTHER): Payer: Medicare HMO | Admitting: Gastroenterology

## 2022-12-06 ENCOUNTER — Encounter (INDEPENDENT_AMBULATORY_CARE_PROVIDER_SITE_OTHER): Payer: Self-pay | Admitting: Gastroenterology

## 2022-12-06 VITALS — BP 127/82 | HR 98 | Temp 97.8°F | Ht 74.0 in | Wt 280.9 lb

## 2022-12-06 DIAGNOSIS — K625 Hemorrhage of anus and rectum: Secondary | ICD-10-CM | POA: Insufficient documentation

## 2022-12-06 DIAGNOSIS — K5900 Constipation, unspecified: Secondary | ICD-10-CM | POA: Insufficient documentation

## 2022-12-06 DIAGNOSIS — K5901 Slow transit constipation: Secondary | ICD-10-CM | POA: Diagnosis not present

## 2022-12-06 NOTE — Telephone Encounter (Signed)
Error

## 2022-12-06 NOTE — Telephone Encounter (Signed)
Pt prefers Friday for Colonoscopy (ASA 1). Will call pt when April schedule is out.

## 2022-12-06 NOTE — Patient Instructions (Signed)
Schedule colonoscopy in April 2024 Start taking Miralax 1 capful every day for one week. If bowel movements do not improve, increase to 2 capfuls every day. If after two weeks there is no improvement, increase to 2 capfuls in AM and one at night.

## 2022-12-06 NOTE — Progress Notes (Signed)
Adam Lee, M.D. Gastroenterology & Hepatology [facility] [address] [place], [place] [address]  Primary Care Physician: Kathyrn Drown, MD [place] [id number]  I will communicate my assessment and recommendations to the referring MD via EMR.  Problems: Rectal bleeding, likely related to hemorrhoids  History of Present Illness: Adam Lee. is a 67 y.o. male with past medical history of diabetes, hypertension, hyperlipidemia and prostate cancer, who presents for evaluation of rectal bleeding.  Patient reports having intermittent episodes of rectal bleeding for a few years. However, last week he had a large a amount of fresh blood when he wiped which made him concerned. He reports feeling constipation recently and was straining when the bleeding came. Has not presented any rectal bleeding but reported having pressure in his rectum. He denied any nausea, vomiting, fever, chills,  melena, hematemesis, abdominal distention, abdominal pain, diarrhea, jaundice, pruritus or weight loss.  Patient reports that he usually does not take any laxative on a regular basis but took milk of magnesia after he started having rectal bleeding and this improved his constipation. His rectal bleeding has subsided. He has also used Preparation H with adequate control of his rectal pressure.  Last Colonoscopy: 01/03/2020 1 polyp in the distal ascending colon measuring between 4 to 10 mm was removed with biopsy forceps and APC (tubular adenoma), 2 small polyps were removed from the hepatic flexure with a forceps (tubular adenomas). Recommended to repeat colonoscopy in 3 years.  Past Medical History: Past Medical History:  Diagnosis Date   Anginal pain ([place]) 2019   Diabetes mellitus without complication ([place])    Dysrhythmia    stress related. fast HR   History of stress test 2014   intermediate risk   Hyperlipemia     Hypertension    Prostate cancer [facility])     Past Surgical History: Past Surgical History:  Procedure Laterality Date   COLONOSCOPY N/A 01/03/2020   Procedure: COLONOSCOPY;  Surgeon: Rogene Houston, MD;  [facility];  Service: Endoscopy;  Laterality: N/A;  [place] Bilateral 09/24/2021   Procedure: CYSTOSCOPY WITH PYELOGRAM;  Surgeon: Cleon Gustin, MD;  Location: [facility] ORS;  Service: Urology;  Laterality: Bilateral;   CYSTOSCOPY WITH BIOPSY N/A 09/24/2021   Procedure: CYSTOSCOPY WITH BLADDER BIOPSY;  Surgeon: Cleon Gustin, MD;  Location: [facility];  Service: Urology;  Laterality: N/A;   CYSTOSCOPY WITH FULGERATION N/A 09/24/2021   Procedure: CYSTOSCOPY WITH FULGERATION;  Surgeon: Cleon Gustin, MD;  Location: [facility];  Service: Urology;  Laterality: N/A;   HERNIA REPAIR Left    2000, Dr. Aviva Signs   LYMPHADENECTOMY Bilateral 01/23/2020   Procedure: LYMPHADENECTOMY;  Surgeon: Alexis Frock, MD;  Location: [facility];  Service: Urology;  Laterality: Bilateral;   POLYPECTOMY  01/03/2020   Procedure: POLYPECTOMY;  Surgeon: Rogene Houston, MD;  [facility];  Service: Endoscopy;;   ROBOT ASSISTED LAPAROSCOPIC RADICAL PROSTATECTOMY N/A 01/23/2020   Procedure: XI ROBOTIC ASSISTED LAPAROSCOPIC RADICAL PROSTATECTOMY;  Surgeon: Alexis Frock, MD;  Location: [facility];  Service: Urology;  Laterality: N/A;  3 HRS    Family History: Family History  Problem Relation Age of Onset   Diabetes Father    Heart disease Mother    Cancer Other    Breast cancer Neg Hx    Prostate cancer Neg Hx    Colon cancer Neg Hx    Pancreatic cancer  Neg Hx     Social History: Social History   Tobacco Use  Smoking Status Never  Smokeless Tobacco Never   Social History   Substance and Sexual Activity  Alcohol Use No   Alcohol/week: 0.0 standard drinks of alcohol   Social History   Substance and Sexual Activity  Drug Use No    Allergies: No  Known Allergies  Medications: Current Outpatient Medications  Medication Sig Dispense Refill   aspirin 81 MG chewable tablet Chew 81 mg by mouth daily.     atorvastatin (LIPITOR) 10 MG tablet Take 1 tablet (10 mg total) by mouth daily. 90 tablet 1   blood glucose meter kit and supplies Dispense based on patient and insurance preference. Use to check sugars once a day (FOR ICD-10 E10.9, E11.9). 1 each 0   glipiZIDE (GLIPIZIDE XL) 5 MG 24 hr tablet Take 1 tablet (5 mg total) by mouth daily with breakfast. 30 tablet 4   Lancets (ONETOUCH DELICA PLUS 123XX123) MISC USE TO OBTAIN A BLOOD SPECIMEN TO TEST BLOOD SUGAR DAILY 100 each 0   lisinopril (ZESTRIL) 10 MG tablet Take 1 tablet (10 mg total) by mouth daily. 90 tablet 1   metFORMIN (GLUCOPHAGE) 500 MG tablet Take 1 tablet (500 mg total) by mouth 2 (two) times daily with a meal. (Patient taking differently: Take 1,000 mg by mouth daily with breakfast.) 180 tablet 0   No current facility-administered medications for this visit.    Review of Systems: GENERAL: negative for malaise, night sweats HEENT: No changes in hearing or vision, no nose bleeds or other nasal problems. NECK: Negative for lumps, goiter, pain and significant neck swelling RESPIRATORY: Negative for cough, wheezing CARDIOVASCULAR: Negative for chest pain, leg swelling, palpitations, orthopnea GI: SEE HPI MUSCULOSKELETAL: Negative for joint pain or swelling, back pain, and muscle pain. SKIN: Negative for lesions, rash PSYCH: Negative for sleep disturbance, mood disorder and recent psychosocial stressors. HEMATOLOGY Negative for prolonged bleeding, bruising easily, and swollen nodes. ENDOCRINE: Negative for cold or heat intolerance, polyuria, polydipsia and goiter. NEURO: negative for tremor, gait imbalance, syncope and seizures. The remainder of the review of systems is noncontributory.   Physical Exam: BP 127/82 (BP Location: Left Arm, Patient Position: Sitting, Cuff  Size: Large)   Pulse 98   Temp 97.8 F (36.6 C) (Temporal)   Ht '6\' 2"'$  (1.88 m)   Wt 280 lb 14.4 oz (127.4 kg)   BMI 36.07 kg/m  GENERAL: The patient is AO x3, in no acute distress. HEENT: Head is normocephalic and atraumatic. EOMI are intact. Mouth is well hydrated and without lesions. NECK: Supple. No masses LUNGS: Clear to auscultation. No presence of rhonchi/wheezing/rales. Adequate chest expansion HEART: RRR, normal s1 and s2. ABDOMEN: Soft, nontender, no guarding, no peritoneal signs, and nondistended. BS +. No masses. RECTAL EXAM: no external lesions, normal tone, no masses, brown stool without blood. Chaperone: Marisa Cyphers EXTREMITIES: Without any cyanosis, clubbing, rash, lesions or edema. NEUROLOGIC: AOx3, no focal motor deficit. SKIN: no jaundice, no rashes  Imaging/Labs: as above  I personally reviewed and interpreted the available labs, imaging and endoscopic files.  Impression and Plan: Adam Elick. is a 67 y.o. male with past medical history of diabetes, hypertension, hyperlipidemia and prostate cancer, who presents for evaluation of rectal bleeding.  The patient has presented intermittent episodes of rectal bleeding for multiple years which have been self-limited and have been related to straining.  Given the fact he had a relatively recent colonoscopy without major  abnormalities besides hemorrhoids, I do consider this is related to recurrent hemorrhoidal bleeding that has been self-limited.  We discussed the importance of avoiding straining but keeping soft bowel movements, for which she will start using MiraLAX on a regular basis.  The patient will be due for a repeat colonoscopy for surveillance in April 2024, a colonoscopy will be scheduled time.  -Schedule colonoscopy in April 2024 -Start taking Miralax 1 capful every day for one week. If bowel movements do not improve, increase to 2 capfuls every day. If after two weeks there is no improvement, increase to 2  capfuls in AM and one at night.  All questions were answered.      Adam Peppers, MD Gastroenterology and Hepatology [facility]

## 2022-12-09 MED ORDER — PEG 3350-KCL-NA BICARB-NACL 420 G PO SOLR: 4000.0000 mL | Freq: Once | ORAL | 0 refills | Status: AC

## 2022-12-09 NOTE — Telephone Encounter (Signed)
Pt contacted and TCS scheduled for 01/14/23. Instructions will be mailed to patient. Prep sent to pharmacy. Will call back with pre op date. (Pt preferred Fridays)

## 2022-12-09 NOTE — Addendum Note (Signed)
Addended by: Vicente Males on: 12/09/2022 12:30 PM   Modules accepted: Orders

## 2022-12-13 NOTE — Telephone Encounter (Signed)
Pt contacted and made aware that pre op telephone call would occur on 01/12/23

## 2022-12-20 ENCOUNTER — Other Ambulatory Visit: Payer: Self-pay | Admitting: Family Medicine

## 2023-01-12 ENCOUNTER — Encounter (HOSPITAL_COMMUNITY): Payer: Self-pay

## 2023-01-12 ENCOUNTER — Encounter (HOSPITAL_COMMUNITY)
Admission: RE | Admit: 2023-01-12 | Discharge: 2023-01-12 | Disposition: A | Discharge status: Home / Self Care | Payer: Medicare HMO | Source: Ambulatory Visit | Attending: Gastroenterology | Admitting: Gastroenterology

## 2023-01-14 ENCOUNTER — Ambulatory Visit (HOSPITAL_BASED_OUTPATIENT_CLINIC_OR_DEPARTMENT_OTHER): Payer: Medicare HMO | Admitting: Certified Registered Nurse Anesthetist

## 2023-01-14 ENCOUNTER — Ambulatory Visit (HOSPITAL_COMMUNITY): Payer: Medicare HMO | Admitting: Certified Registered Nurse Anesthetist

## 2023-01-14 ENCOUNTER — Encounter (HOSPITAL_COMMUNITY): Payer: Self-pay | Admitting: Gastroenterology

## 2023-01-14 ENCOUNTER — Ambulatory Visit (HOSPITAL_COMMUNITY)
Admission: RE | Admit: 2023-01-14 | Discharge: 2023-01-14 | Disposition: A | Discharge status: Home / Self Care | Payer: Medicare HMO | Source: Ambulatory Visit | Attending: Gastroenterology | Admitting: Gastroenterology

## 2023-01-14 ENCOUNTER — Encounter (HOSPITAL_COMMUNITY)
Admission: RE | Disposition: A | Discharge status: Home / Self Care | Payer: Self-pay | Source: Ambulatory Visit | Attending: Gastroenterology

## 2023-01-14 DIAGNOSIS — Z860100 Personal history of colon polyps, unspecified: Secondary | ICD-10-CM

## 2023-01-14 DIAGNOSIS — K5520 Angiodysplasia of colon without hemorrhage: Secondary | ICD-10-CM

## 2023-01-14 DIAGNOSIS — Z800 Family history of malignant neoplasm of digestive organs: Secondary | ICD-10-CM | POA: Diagnosis not present

## 2023-01-14 DIAGNOSIS — E785 Hyperlipidemia, unspecified: Secondary | ICD-10-CM | POA: Diagnosis not present

## 2023-01-14 DIAGNOSIS — D123 Benign neoplasm of transverse colon: Secondary | ICD-10-CM | POA: Diagnosis not present

## 2023-01-14 DIAGNOSIS — Z7984 Long term (current) use of oral hypoglycemic drugs: Secondary | ICD-10-CM | POA: Insufficient documentation

## 2023-01-14 DIAGNOSIS — K635 Polyp of colon: Secondary | ICD-10-CM | POA: Diagnosis not present

## 2023-01-14 DIAGNOSIS — Z8546 Personal history of malignant neoplasm of prostate: Secondary | ICD-10-CM | POA: Insufficient documentation

## 2023-01-14 DIAGNOSIS — E119 Type 2 diabetes mellitus without complications: Secondary | ICD-10-CM | POA: Insufficient documentation

## 2023-01-14 DIAGNOSIS — Z1211 Encounter for screening for malignant neoplasm of colon: Secondary | ICD-10-CM | POA: Diagnosis not present

## 2023-01-14 DIAGNOSIS — I10 Essential (primary) hypertension: Secondary | ICD-10-CM | POA: Insufficient documentation

## 2023-01-14 DIAGNOSIS — Z09 Encounter for follow-up examination after completed treatment for conditions other than malignant neoplasm: Secondary | ICD-10-CM | POA: Diagnosis not present

## 2023-01-14 HISTORY — PX: COLONOSCOPY WITH PROPOFOL: SHX5780

## 2023-01-14 HISTORY — PX: POLYPECTOMY: SHX5525

## 2023-01-14 LAB — GLUCOSE, CAPILLARY: Glucose-Capillary: 92 mg/dL (ref 70–99)

## 2023-01-14 LAB — HM COLONOSCOPY

## 2023-01-14 SURGERY — COLONOSCOPY WITH PROPOFOL
Anesthesia: General

## 2023-01-14 MED ORDER — STERILE WATER FOR IRRIGATION IR SOLN
Status: DC | PRN
  Administered 2023-01-14: 60 mL

## 2023-01-14 MED ORDER — LIDOCAINE HCL (CARDIAC) PF 100 MG/5ML IV SOSY
PREFILLED_SYRINGE | INTRAVENOUS | Status: DC | PRN
  Administered 2023-01-14: 60 mg via INTRATRACHEAL

## 2023-01-14 MED ORDER — LIDOCAINE HCL (PF) 2 % IJ SOLN
INTRAMUSCULAR | Status: AC
  Filled 2023-01-14: qty 30

## 2023-01-14 MED ORDER — PROPOFOL 10 MG/ML IV BOLUS
INTRAVENOUS | Status: DC | PRN
  Administered 2023-01-14: 100 mg via INTRAVENOUS

## 2023-01-14 MED ORDER — PROPOFOL 500 MG/50ML IV EMUL
INTRAVENOUS | Status: DC | PRN
  Administered 2023-01-14: 150 ug/kg/min via INTRAVENOUS

## 2023-01-14 MED ORDER — LACTATED RINGERS IV SOLN: INTRAVENOUS | Status: DC | PRN

## 2023-01-14 NOTE — Discharge Instructions (Signed)
You are being discharged to home.  Resume your previous diet.  We are waiting for your pathology results.  If recurrent rectal bleeding, will need to repeat flexible sigmoidoscopy in 4-6 weeks

## 2023-01-14 NOTE — H&P (Signed)
Adam Lee. is an 67 y.o. male.   Chief Complaint: History of colonic polyps HPI: Adam Lee. is a 67 y.o. male with past medical history of diabetes, hypertension, hyperlipidemia and prostate cancer, who presents for history of colonic polyps.  The patient denies having any nausea, vomiting, fever, chills, hematochezia, melena, hematemesis, abdominal distention, abdominal pain, diarrhea, jaundice, pruritus or weight loss.  Last Colonoscopy: 01/03/2020 1 polyp in the distal ascending colon measuring between 4 to 10 mm was removed with biopsy forceps and APC (tubular adenoma), 2 small polyps were removed from the hepatic flexure with a forceps (tubular adenomas). Recommended to repeat colonoscopy in 3 years.  No family history of colon cancer.  Past Medical History:  Diagnosis Date   Anginal pain 2019   Diabetes mellitus without complication    Dysrhythmia    stress related. fast HR   History of stress test 2014   intermediate risk   Hyperlipemia    Hypertension    Prostate cancer     Past Surgical History:  Procedure Laterality Date   COLONOSCOPY N/A 01/03/2020   Procedure: COLONOSCOPY;  Surgeon: Malissa Hippo, MD;  [facility];  Service: Endoscopy;  Laterality: N/A;  1055   CYSTOSCOPY W/ RETROGRADES Bilateral 09/24/2021   Procedure: CYSTOSCOPY WITH PYELOGRAM;  Surgeon: Malen Gauze, MD;  Location: [facility];  Service: Urology;  Laterality: Bilateral;   CYSTOSCOPY WITH BIOPSY N/A 09/24/2021   Procedure: CYSTOSCOPY WITH BLADDER BIOPSY;  Surgeon: Malen Gauze, MD;  Location: [facility];  Service: Urology;  Laterality: N/A;   CYSTOSCOPY WITH FULGERATION N/A 09/24/2021   Procedure: CYSTOSCOPY WITH FULGERATION;  Surgeon: Malen Gauze, MD;  Location: [facility];  Service: Urology;  Laterality: N/A;   HERNIA REPAIR Left    2000, Dr. Franky Macho   LYMPHADENECTOMY Bilateral 01/23/2020   Procedure: LYMPHADENECTOMY;  Surgeon: Sebastian Ache, MD;  Location:  [facility];  Service: Urology;  Laterality: Bilateral;   POLYPECTOMY  01/03/2020   Procedure: POLYPECTOMY;  Surgeon: Malissa Hippo, MD;  [facility];  Service: Endoscopy;;   ROBOT ASSISTED LAPAROSCOPIC RADICAL PROSTATECTOMY N/A 01/23/2020   Procedure: XI ROBOTIC ASSISTED LAPAROSCOPIC RADICAL PROSTATECTOMY;  Surgeon: Sebastian Ache, MD;  [facility];  Service: Urology;  Laterality: N/A;  3 HRS    Family History  Problem Relation Age of Onset   Diabetes Father    Heart disease Mother    Cancer Other    Breast cancer Neg Hx    Prostate cancer Neg Hx    Colon cancer Neg Hx    Pancreatic cancer Neg Hx    Social History:  reports that he has never smoked. He has never used smokeless tobacco. He reports that he does not drink alcohol and does not use drugs.  Allergies: No Known Allergies  Medications Prior to Admission  Medication Sig Dispense Refill   aspirin 81 MG chewable tablet Chew 81 mg by mouth daily.     atorvastatin (LIPITOR) 10 MG tablet Take 1 tablet (10 mg total) by mouth daily. 90 tablet 1   blood glucose meter kit and supplies Dispense based on patient and insurance preference. Use to check sugars once a day (FOR ICD-10 E10.9, E11.9). 1 each 0   glipiZIDE (GLUCOTROL XL) 5 MG 24 hr tablet TAKE 1 TABLET BY MOUTH EVERY DAY WITH BREAKFAST 90 tablet 0   Lancets (ONETOUCH DELICA PLUS LANCET33G) MISC USE TO OBTAIN A BLOOD SPECIMEN TO TEST BLOOD SUGAR  DAILY 100 each 0   lisinopril (ZESTRIL) 10 MG tablet Take 1 tablet (10 mg total) by mouth daily. 90 tablet 1   metFORMIN (GLUCOPHAGE) 500 MG tablet Take 1 tablet (500 mg total) by mouth 2 (two) times daily with a meal. (Patient taking differently: Take 1,000 mg by mouth daily with breakfast.) 180 tablet 0   metoprolol tartrate (LOPRESSOR) 25 MG tablet Take 12.5 mg by mouth daily.      Results for orders placed or performed during the hospital encounter of 01/14/23 (from the past 48 hour(s))  Glucose, capillary     Status:  None   Collection Time: 01/14/23  7:41 AM  Result Value Ref Range   Glucose-Capillary 92 70 - 99 mg/dL    Comment: Glucose reference range applies only to samples taken after fasting for at least 8 hours.   No results found.  Review of Systems  All other systems reviewed and are negative.   Blood pressure 123/87, pulse 91, temperature 98.3 F (36.8 C), temperature source Oral, resp. rate (!) 25, height 6\' 2"  (1.88 m), weight 129 kg, SpO2 100 %. Physical Exam  GENERAL: The patient is AO x3, in no acute distress. HEENT: Head is normocephalic and atraumatic. EOMI are intact. Mouth is well hydrated and without lesions. NECK: Supple. No masses LUNGS: Clear to auscultation. No presence of rhonchi/wheezing/rales. Adequate chest expansion HEART: RRR, normal s1 and s2. ABDOMEN: Soft, nontender, no guarding, no peritoneal signs, and nondistended. BS +. No masses.EXTREMITIES: Without any cyanosis, clubbing, rash, lesions or edema. NEUROLOGIC: AOx3, no focal motor deficit. SKIN: no jaundice, no rashes  Assessment/Plan  Adam Lee. is a 67 y.o. male with past medical history of diabetes, hypertension, hyperlipidemia and prostate cancer, who presents for history of colonic polyps.  Will proceed with colonoscopy.  Dolores Frame, MD 01/14/2023, 8:10 AM

## 2023-01-14 NOTE — Anesthesia Postprocedure Evaluation (Signed)
Anesthesia Post Note  Patient: Adam Lee.  Procedure(s) Performed: COLONOSCOPY WITH PROPOFOL POLYPECTOMY  Patient location during evaluation: Phase II Anesthesia Type: General Level of consciousness: awake Pain management: pain level controlled Vital Signs Assessment: post-procedure vital signs reviewed and stable Respiratory status: spontaneous breathing and respiratory function stable Cardiovascular status: blood pressure returned to baseline and stable Postop Assessment: no headache and no apparent nausea or vomiting Anesthetic complications: no Comments: Late entry   No notable events documented.   Last Vitals:  Vitals:   01/14/23 0754 01/14/23 0852  BP: 123/87 97/68  Pulse:  92  Resp:  (!) 24  Temp:  36.4 C  SpO2:  92%    Last Pain:  Vitals:   01/14/23 0857  TempSrc:   PainSc: 0-No pain                 Windell Norfolk

## 2023-01-14 NOTE — Transfer of Care (Addendum)
Immediate Anesthesia Transfer of Care Note  Patient: Adam Lee.  Procedure(s) Performed: COLONOSCOPY WITH PROPOFOL POLYPECTOMY  Patient Location: Endoscopy Unit  Anesthesia Type:General  Level of Consciousness: awake and alert   Airway & Oxygen Therapy: Patient Spontanous Breathing  Post-op Assessment: Report given to RN and Post -op Vital signs reviewed and stable  Post vital signs: Reviewed and stable  Last Vitals:  Vitals Value Taken Time  BP 100/65   Temp 98   Pulse 95   Resp 16   SpO2 98     Last Pain:  Vitals:   01/14/23 0750  TempSrc: Oral  PainSc: 0-No pain         Complications: No notable events documented.

## 2023-01-14 NOTE — Op Note (Signed)
[facility] Patient Name: Adam Lee Procedure Date: 01/14/2023 8:03 AM MRN: [id number] Date of Birth: [birth year] Attending MD: Katrinka Blazing , , [id number] CSN: [id number] Age: 67 Admit Type: Outpatient Procedure:                Colonoscopy Indications:              Surveillance: Personal history of adenomatous                            polyps on last colonoscopy 3 years ago Providers:                Katrinka Blazing, Nena Polio, RN, Burke Keels,                            Technician Referring MD:              Medicines:                Monitored Anesthesia Care Complications:            No immediate complications. Estimated Blood Loss:     Estimated blood loss: none. Procedure:                Pre-Anesthesia Assessment:                           - Prior to the procedure, a History and Physical                            was performed, and patient medications, allergies                            and sensitivities were reviewed. The patient's                            tolerance of previous anesthesia was reviewed.                           - The risks and benefits of the procedure and the                            sedation options and risks were discussed with the                            patient. All questions were answered and informed                            consent was obtained.                           - ASA Grade Assessment: II - A patient with mild                            systemic disease.                           After obtaining informed consent, the colonoscope  was passed under direct vision. Throughout the                            procedure, the patient's blood pressure, pulse, and                            oxygen saturations were monitored continuously. The                            [id number]-[id number] ([id number]) scope was introduced through                            the anus and advanced to the the cecum, identified                             by appendiceal orifice and ileocecal valve. The                            colonoscopy was performed without difficulty. The                            patient tolerated the procedure well. The quality                            of the bowel preparation was adequate. Scope In: 8:16:07 AM Scope Out: 8:47:14 AM Scope Withdrawal Time: 0 hours 25 minutes 45 seconds  Total Procedure Duration: 0 hours 31 minutes 7 seconds  Findings:      The perianal and digital rectal examinations were normal.      A 2 mm polyp was found in the transverse colon. The polyp was sessile.       The polyp was removed with a cold snare. Resection and retrieval were       complete.      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       Coagulation for bleeding prevention using argon plasma at 0.3       liters/minute and 20 watts was successful.      No additional abnormalities were found on retroflexion. Impression:               - One 2 mm polyp in the transverse colon, removed                            with a cold snare. Resected and retrieved.                           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 Treated with argon plasma  coagulation ([facility]). Moderate Sedation:      Per Anesthesia Care Recommendation:           - Discharge patient to home (ambulatory).                           - Resume previous diet.                           - Await pathology results.                           - If recurrent rectal bleeding, will need to repeat                            flexible sigmoidoscopy in 4-6 weeks Procedure Code(s):        --- Professional ---                           [phone], 59, Colonoscopy, flexible; with control of                             bleeding, any method                           [id number], Colonoscopy, flexible; with removal of                            tumor(s), polyp(s), or other lesion(s) by snare                            technique Diagnosis Code(s):        --- Professional ---                           Z86.010, Personal history of colonic polyps                           D12.3, Benign neoplasm of transverse colon (hepatic                            flexure or splenic flexure)                           K55.20, Angiodysplasia of colon without hemorrhage CPT copyright 2022 American Medical Association. All rights reserved. The codes documented in this report are preliminary and upon coder review may  be revised to meet current compliance requirements. Katrinka Blazing, MD Katrinka Blazing,  01/14/2023 8:53:54 AM This report has been signed electronically. Number of Addenda: 0

## 2023-01-14 NOTE — Anesthesia Preprocedure Evaluation (Signed)
Anesthesia Evaluation  Patient identified by MRN, date of birth, ID band Patient awake    Reviewed: Allergy & Precautions, H&P , NPO status , Patient's Chart, lab work & pertinent test results, reviewed documented beta blocker date and time   Airway Mallampati: II  TM Distance: >3 FB Neck ROM: full    Dental no notable dental hx.    Pulmonary neg pulmonary ROS, shortness of breath   Pulmonary exam normal breath sounds clear to auscultation       Cardiovascular Exercise Tolerance: Good hypertension, + angina  negative cardio ROS + dysrhythmias  Rhythm:regular Rate:Normal     Neuro/Psych negative neurological ROS  negative psych ROS   GI/Hepatic negative GI ROS, Neg liver ROS,,,  Endo/Other  negative endocrine ROSdiabetes    Renal/GU negative Renal ROS  negative genitourinary   Musculoskeletal   Abdominal   Peds  Hematology negative hematology ROS (+)   Anesthesia Other Findings   Reproductive/Obstetrics negative OB ROS                             Anesthesia Physical Anesthesia Plan  ASA: 3  Anesthesia Plan: General   Post-op Pain Management:    Induction:   PONV Risk Score and Plan: Propofol infusion  Airway Management Planned:   Additional Equipment:   Intra-op Plan:   Post-operative Plan:   Informed Consent: I have reviewed the patients History and Physical, chart, labs and discussed the procedure including the risks, benefits and alternatives for the proposed anesthesia with the patient or authorized representative who has indicated his/her understanding and acceptance.     Dental Advisory Given  Plan Discussed with: CRNA  Anesthesia Plan Comments:        Anesthesia Quick Evaluation

## 2023-01-17 ENCOUNTER — Encounter (INDEPENDENT_AMBULATORY_CARE_PROVIDER_SITE_OTHER): Payer: Self-pay | Admitting: *Deleted

## 2023-01-17 LAB — SURGICAL PATHOLOGY

## 2023-01-20 ENCOUNTER — Encounter (HOSPITAL_COMMUNITY): Payer: Self-pay | Admitting: Gastroenterology

## 2023-01-21 ENCOUNTER — Other Ambulatory Visit: Payer: Medicare HMO

## 2023-01-21 DIAGNOSIS — C61 Malignant neoplasm of prostate: Secondary | ICD-10-CM | POA: Diagnosis not present

## 2023-01-22 LAB — PSA: Prostate Specific Ag, Serum: <0.1 ng/mL (ref 0.0–4.0)

## 2023-01-28 ENCOUNTER — Encounter: Payer: Self-pay | Admitting: Urology

## 2023-01-28 ENCOUNTER — Ambulatory Visit (INDEPENDENT_AMBULATORY_CARE_PROVIDER_SITE_OTHER): Payer: Medicare HMO | Admitting: Urology

## 2023-01-28 VITALS — BP 132/81 | HR 90 | Wt 285.0 lb

## 2023-01-28 DIAGNOSIS — N3941 Urge incontinence: Secondary | ICD-10-CM

## 2023-01-28 DIAGNOSIS — C61 Malignant neoplasm of prostate: Secondary | ICD-10-CM

## 2023-01-28 LAB — URINALYSIS, ROUTINE W REFLEX MICROSCOPIC
Bilirubin, UA: NEGATIVE
Glucose, UA: NEGATIVE
Ketones, UA: NEGATIVE
Nitrite, UA: NEGATIVE
Specific Gravity, UA: 1.025 (ref 1.005–1.030)
Urobilinogen, Ur: 0.2 mg/dL (ref 0.2–1.0)
pH, UA: 5.5 (ref 5.0–7.5)

## 2023-01-28 LAB — MICROSCOPIC EXAMINATION

## 2023-01-28 NOTE — Progress Notes (Unsigned)
01/28/2023 11:12 AM   Lurlean Nanny. 11-03-1955 [id number]  Referring provider: Babs Sciara, MD [address] [place],  [place] [address]  Followup prostate cancer and urge incontinence   HPI: Mr Narez is a 67yo here for followup for prostate cancer and urge incontinence. The nocturia has improved to 1x. He is not bothered by his incontinence. PSA undetectable   PMH: Past Medical History:  Diagnosis Date   Anginal pain (HCC) 2019   Diabetes mellitus without complication (HCC)    Dysrhythmia    stress related. fast HR   History of stress test 2014   intermediate risk   Hyperlipemia    Hypertension    Prostate cancer [facility])     Surgical History: Past Surgical History:  Procedure Laterality Date   COLONOSCOPY N/A 01/03/2020   Procedure: COLONOSCOPY;  Surgeon: Malissa Hippo, MD;  [facility]: [facility];  Service: Endoscopy;  Laterality: N/A;  1055   COLONOSCOPY WITH PROPOFOL N/A 01/14/2023   Procedure: COLONOSCOPY WITH PROPOFOL;  Surgeon: Dolores Frame, MD;  Location: [facility];  Service: Gastroenterology;  Laterality: N/A;  900am, asa 1-2   CYSTOSCOPY W/ RETROGRADES Bilateral 09/24/2021   Procedure: CYSTOSCOPY WITH PYELOGRAM;  Surgeon: Malen Gauze, MD;  Location: [facility];  Service: Urology;  Laterality: Bilateral;   CYSTOSCOPY WITH BIOPSY N/A 09/24/2021   Procedure: CYSTOSCOPY WITH BLADDER BIOPSY;  Surgeon: Malen Gauze, MD;  Location: [facility];  Service: Urology;  Laterality: N/A;   CYSTOSCOPY WITH FULGERATION N/A 09/24/2021   Procedure: CYSTOSCOPY WITH FULGERATION;  Surgeon: Malen Gauze, MD;  Location: [facility];  Service: Urology;  Laterality: N/A;   HERNIA REPAIR Left    2000, Dr. Franky Macho   LYMPHADENECTOMY Bilateral 01/23/2020   Procedure: LYMPHADENECTOMY;  Surgeon: Sebastian Ache, MD;  Location: [facility];  Service: Urology;  Laterality: Bilateral;   POLYPECTOMY  01/03/2020   Procedure: POLYPECTOMY;  Surgeon: Malissa Hippo, MD;  [facility];  Service: Endoscopy;;   POLYPECTOMY  01/14/2023   Procedure: POLYPECTOMY;  Surgeon: Dolores Frame, MD;  [facility];  Service: Gastroenterology;;   ROBOT ASSISTED LAPAROSCOPIC RADICAL PROSTATECTOMY N/A 01/23/2020   Procedure: XI ROBOTIC ASSISTED LAPAROSCOPIC RADICAL PROSTATECTOMY;  Surgeon: Sebastian Ache, MD;  Location: [facility];  Service: Urology;  Laterality: N/A;  3 HRS    Home Medications:  Allergies as of 01/28/2023   No Known Allergies      Medication List        Accurate as of January 28, 2023 11:12 AM. If you have any questions, ask your nurse or doctor.          aspirin 81 MG chewable tablet Chew 81 mg by mouth daily.   atorvastatin 10 MG tablet Commonly known as: LIPITOR Take 1 tablet (10 mg total) by mouth daily.   blood glucose meter kit and supplies Dispense based on patient and insurance preference. Use to check sugars once a day (FOR ICD-10 E10.9, E11.9).   glipiZIDE 5 MG 24 hr tablet Commonly known as: GLUCOTROL XL TAKE 1 TABLET BY MOUTH EVERY DAY WITH BREAKFAST   lisinopril 10 MG tablet Commonly known as: ZESTRIL Take 1 tablet (10 mg total) by mouth daily.   metFORMIN 500 MG tablet Commonly known as: GLUCOPHAGE Take 1 tablet (500 mg total) by mouth 2 (two) times daily with a meal. What changed:  how much to take when to take this   metoprolol tartrate 25  MG tablet Commonly known as: LOPRESSOR Take 12.5 mg by mouth daily.   OneTouch Delica Plus Lancet33G Misc USE TO OBTAIN A BLOOD SPECIMEN TO TEST BLOOD SUGAR DAILY        Allergies: No Known Allergies  Family History: Family History  Problem Relation Age of Onset   Diabetes Father    Heart disease Mother    Cancer Other    Breast cancer Neg Hx    Prostate cancer Neg Hx    Colon cancer Neg Hx    Pancreatic cancer Neg Hx     Social History:  reports that he has never smoked. He has never used smokeless tobacco. He reports  that he does not drink alcohol and does not use drugs.  ROS: All other review of systems were reviewed and are negative except what is noted above in HPI  Physical Exam: BP 132/81   Pulse 90   Wt 285 lb (129.3 kg)   BMI 36.59 kg/m   Constitutional:  Alert and oriented, No acute distress. HEENT: [place] AT, moist mucus membranes.  Trachea midline, no masses. Cardiovascular: No clubbing, cyanosis, or edema. Respiratory: Normal respiratory effort, no increased work of breathing. GI: Abdomen is soft, nontender, nondistended, no abdominal masses GU: No CVA tenderness.  Lymph: No cervical or inguinal lymphadenopathy. Skin: No rashes, bruises or suspicious lesions. Neurologic: Grossly intact, no focal deficits, moving all 4 extremities. Psychiatric: Normal mood and affect.  Laboratory Data: Lab Results  Component Value Date   WBC 8.0 11/24/2022   HGB 11.5 11/24/2022   HCT 41.3 11/24/2022   MCV 88 11/24/2022   PLT 280 11/24/2022    Lab Results  Component Value Date   CREATININE 1.71 (H) 09/10/2022    Lab Results  Component Value Date   PSA <0.1 03/04/2020   PSA 2.3 12/06/2014    Lab Results  Component Value Date   TESTOSTERONE 594 04/17/2021    Lab Results  Component Value Date   HGBA1C 8.3 (H) 09/10/2022    Urinalysis    Component Value Date/Time   APPEARANCEUR Clear 07/30/2022 1002   GLUCOSEU Trace (A) 07/30/2022 1002   BILIRUBINUR Negative 07/30/2022 1002   PROTEINUR Negative 07/30/2022 1002   NITRITE Negative 07/30/2022 1002   LEUKOCYTESUR Negative 07/30/2022 1002    Lab Results  Component Value Date   LABMICR 58.3 09/10/2022   WBCUA 11-30 (A) 01/29/2022   LABEPIT 0-10 01/29/2022   MUCUS Present 12/18/2021   BACTERIA Few 01/29/2022    Pertinent Imaging:  No results found for this or any previous visit.  No results found for this or any previous visit.  No results found for this or any previous visit.  No results found for this or any previous  visit.  No results found for this or any previous visit.  No valid procedures specified. No results found for this or any previous visit.  No results found for this or any previous visit.   Assessment & Plan:    1. Prostate cancer (HCC) -followup 6 months with psa - Urinalysis, Routine w reflex microscopic  2. Urge incontinence -   No follow-ups on file.  Wilkie Aye, MD  [facility] [place]

## 2023-01-28 NOTE — Patient Instructions (Signed)

## 2023-02-03 ENCOUNTER — Telehealth: Payer: Self-pay

## 2023-02-03 DIAGNOSIS — R7989 Other specified abnormal findings of blood chemistry: Secondary | ICD-10-CM

## 2023-02-03 DIAGNOSIS — I10 Essential (primary) hypertension: Secondary | ICD-10-CM

## 2023-02-03 DIAGNOSIS — E1169 Type 2 diabetes mellitus with other specified complication: Secondary | ICD-10-CM

## 2023-02-03 DIAGNOSIS — E7849 Other hyperlipidemia: Secondary | ICD-10-CM

## 2023-02-03 DIAGNOSIS — E559 Vitamin D deficiency, unspecified: Secondary | ICD-10-CM

## 2023-02-03 DIAGNOSIS — Z79899 Other long term (current) drug therapy: Secondary | ICD-10-CM

## 2023-02-03 NOTE — Telephone Encounter (Signed)
Pt needs blood work ordered for his appt on June the 7th?   Pt name is Adam Lee [phone]

## 2023-02-07 NOTE — Telephone Encounter (Signed)
Metabolic 7, A1c, phosphorus, magnesium, vitamin D CKD 3, diabetes, hypokalemia, vitamin D deficiency

## 2023-02-10 ENCOUNTER — Other Ambulatory Visit: Payer: Self-pay | Admitting: Family Medicine

## 2023-02-17 ENCOUNTER — Other Ambulatory Visit: Payer: Self-pay | Admitting: *Deleted

## 2023-02-17 MED ORDER — METFORMIN HCL 500 MG PO TABS: 1000.0000 mg | ORAL_TABLET | Freq: Two times a day (BID) | ORAL | 0 refills | Status: DC

## 2023-02-17 NOTE — Telephone Encounter (Signed)
Blood work ordered in EPIC. Patient notified. 

## 2023-02-17 NOTE — Addendum Note (Signed)
Addended by: Margaretha Sheffield on: 02/17/2023 03:49 PM   Modules accepted: Orders

## 2023-02-18 ENCOUNTER — Other Ambulatory Visit: Payer: Self-pay | Admitting: Family Medicine

## 2023-02-18 DIAGNOSIS — Z79899 Other long term (current) drug therapy: Secondary | ICD-10-CM | POA: Diagnosis not present

## 2023-02-18 DIAGNOSIS — E559 Vitamin D deficiency, unspecified: Secondary | ICD-10-CM | POA: Diagnosis not present

## 2023-02-18 DIAGNOSIS — E1169 Type 2 diabetes mellitus with other specified complication: Secondary | ICD-10-CM | POA: Diagnosis not present

## 2023-02-18 DIAGNOSIS — E7849 Other hyperlipidemia: Secondary | ICD-10-CM

## 2023-02-18 DIAGNOSIS — I10 Essential (primary) hypertension: Secondary | ICD-10-CM | POA: Diagnosis not present

## 2023-02-18 DIAGNOSIS — R7989 Other specified abnormal findings of blood chemistry: Secondary | ICD-10-CM | POA: Diagnosis not present

## 2023-02-19 LAB — BASIC METABOLIC PANEL
BUN/Creatinine Ratio: 17 (ref 10–24)
BUN: 28 mg/dL — ABNORMAL HIGH (ref 8–27)
CO2: 24 mmol/L (ref 20–29)
Calcium: 9.2 mg/dL (ref 8.6–10.2)
Chloride: 100 mmol/L (ref 96–106)
Creatinine, Ser: 1.65 mg/dL — ABNORMAL HIGH (ref 0.76–1.27)
Glucose: 133 mg/dL — ABNORMAL HIGH (ref 70–99)
Potassium: 5.1 mmol/L (ref 3.5–5.2)
Sodium: 140 mmol/L (ref 134–144)
eGFR: 45 mL/min/{1.73_m2} — ABNORMAL LOW (ref >59)

## 2023-02-19 LAB — HEMOGLOBIN A1C
Est. average glucose Bld gHb Est-mCnc: 148 mg/dL
Hgb A1c MFr Bld: 6.8 % — ABNORMAL HIGH (ref 4.8–5.6)

## 2023-02-19 LAB — PHOSPHORUS: Phosphorus: 3.7 mg/dL (ref 2.8–4.1)

## 2023-02-19 LAB — VITAMIN D 25 HYDROXY (VIT D DEFICIENCY, FRACTURES): Vit D, 25-Hydroxy: 17.9 ng/mL — ABNORMAL LOW (ref 30.0–100.0)

## 2023-02-19 LAB — MAGNESIUM: Magnesium: 2.1 mg/dL (ref 1.6–2.3)

## 2023-02-22 MED ORDER — VITAMIN D (ERGOCALCIFEROL) 1.25 MG (50000 UNIT) PO CAPS: 50000.0000 [IU] | ORAL_CAPSULE | ORAL | 2 refills | Status: DC

## 2023-02-22 NOTE — Addendum Note (Signed)
Addended by: Margaretha Sheffield on: 02/22/2023 11:12 AM   Modules accepted: Orders

## 2023-02-25 ENCOUNTER — Other Ambulatory Visit: Payer: Self-pay | Admitting: Family Medicine

## 2023-03-10 ENCOUNTER — Ambulatory Visit: Payer: Medicare HMO | Admitting: Family Medicine

## 2023-03-11 ENCOUNTER — Ambulatory Visit (INDEPENDENT_AMBULATORY_CARE_PROVIDER_SITE_OTHER): Payer: Medicare HMO | Admitting: Family Medicine

## 2023-03-11 VITALS — BP 118/74 | HR 85 | Ht 74.0 in | Wt 282.2 lb

## 2023-03-11 DIAGNOSIS — I10 Essential (primary) hypertension: Secondary | ICD-10-CM | POA: Diagnosis not present

## 2023-03-11 DIAGNOSIS — E1169 Type 2 diabetes mellitus with other specified complication: Secondary | ICD-10-CM

## 2023-03-11 DIAGNOSIS — N1831 Chronic kidney disease, stage 3a: Secondary | ICD-10-CM

## 2023-03-11 DIAGNOSIS — E1140 Type 2 diabetes mellitus with diabetic neuropathy, unspecified: Secondary | ICD-10-CM | POA: Diagnosis not present

## 2023-03-11 DIAGNOSIS — E1122 Type 2 diabetes mellitus with diabetic chronic kidney disease: Secondary | ICD-10-CM | POA: Diagnosis not present

## 2023-03-11 NOTE — Progress Notes (Signed)
   Subjective:    Patient ID: Adam Lee., male    DOB: 1956/02/15, 67 y.o.   MRN: [id number]  HPI Patient arrives today for 6 month follow up. Patient states his feet stay numb all the time and he is concerned.  Results for orders placed or performed in visit on 02/03/23  Basic metabolic panel  Result Value Ref Range   Glucose 133 (H) 70 - 99 mg/dL   BUN 28 (H) 8 - 27 mg/dL   Creatinine, Ser 7.82 (H) 0.76 - 1.27 mg/dL   eGFR 45 (L) >95 AO/ZHY/8.65   BUN/Creatinine Ratio 17 10 - 24   Sodium 140 134 - 144 mmol/L   Potassium 5.1 3.5 - 5.2 mmol/L   Chloride 100 96 - 106 mmol/L   CO2 24 20 - 29 mmol/L   Calcium 9.2 8.6 - 10.2 mg/dL  Hemoglobin H8I  Result Value Ref Range   Hgb A1c MFr Bld 6.8 (H) 4.8 - 5.6 %   Est. average glucose Bld gHb Est-mCnc 148 mg/dL  Phosphorus  Result Value Ref Range   Phosphorus 3.7 2.8 - 4.1 mg/dL  Magnesium  Result Value Ref Range   Magnesium 2.1 1.6 - 2.3 mg/dL  VITAMIN D 25 Hydroxy (Vit-D Deficiency, Fractures)  Result Value Ref Range   Vit D, 25-Hydroxy 17.9 (L) 30.0 - 100.0 ng/mL   Vitamin D level is low, A1c looks very good, phosphorus magnesium looks good, kidney function stable  Review of Systems     Objective:   Physical Exam General-in no acute distress Eyes-no discharge Lungs-respiratory rate normal, CTA CV-no murmurs,RRR Extremities skin warm dry no edema Neuro grossly normal Behavior normal, alert        Assessment & Plan:  1. Type 2 diabetes mellitus with other specified complication, without long-term current use of insulin (HCC) Actually very good control The patient was seen today as part of a comprehensive visit for diabetes. The importance of keeping her A1c at or below 7 range was discussed.  Discussed diet, activity, and medication compliance Emphasized healthy eating primarily with vegetables fruits and if utilizing meats lean meats such as chicken or fish grilled baked broiled Avoid sugary drinks Minimize  and avoid processed foods Fit in regular physical activity preferably 25 to 30 minutes 4 times per week Standard follow-up visit recommended.  Patient aware lack of control and follow-up increases risk of diabetic complications. Regular follow-up visits Yearly ophthalmology Yearly foot exam   2. 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  Not painful hold off on gabapentin and Lyrica Patient will do a follow-up visit by late fall CKD 3 recommend consultation with nephrology

## 2023-03-11 NOTE — Patient Instructions (Signed)
Discussion of GLP-1 medications Please remember these medications are to assist with weight reduction and diabetes control.  They do not take the place of healthy eating and regular physical activity. There are several GLP-1 medications that can help with weight reduction and diabetes control.  GLP-1 medications with indication for diabetes-Trulicity, Victoza, Bydureon , Mounjaro, Ozempic, and Rybelsus (although these are injected medicines except for Rybelsus which is a pill) GLP-1 medications with indications for weight loss-Wegovy, and Saxenda  Mechanism of action  These medications stimulate glucagon peptide receptors.  By doing so it does the following:  1-slows down stomach emptying-essentially food takes longer to go through the stomach and the intestines-this can lessen appetite  2-reduces glucagon secretion from the pancreas-this helps keep blood glucose levels stable between meals  3-increase his insulin release from the pancreas-with diabetics this helps keep blood glucose levels stable  4-promotes the feeling of being full in the brain-encouraged him receptors in the brain received the signal so the brain and body know that it is time to stop eating Benefits of the medication  1-reduced weight-reduction of weight is more significant at higher dosing.  Not as much weight reduction with Rybelsus   A- typically Wegovy at higher doses can assist with significant weight reduction.  2-improved blood glucose levels-with diabetics typically we see a significant drop with A1c when the medication is adjusted appropriately.  3-decrease risk of heart attacks and strokes-individuals with type 2 diabetes are at increased risk of heart attacks and strokes.  These GLP-1 medications can reduce the risk by 22%.  Risk of GLP-1 medications-these are some of the risks.  It is important to talk with your provider in a shared discussion before starting any medication.  Contraindications to GLP-1  medications-in other words who should not take these.  1-individuals with history of thyroid medullary cancer  2-individuals with family history of multiple endocrine neoplasm syndrome type II (MEN 2)  3-individuals with a history of pancreatitis  4-those with a history of severe hypersensitivity or allergy reactions to GLP-1 medications  5-should be avoided in individuals with a history of suicidal attempts or active suicidal ideation.  Cautions include- 1- risk of thyroid C-cell tumors were seen in rats during clinical testing in humans the relevancy of this information has not been determined 2-risk of pancreatitis including fatal and nonfatal hemorrhagic or necrotizing pancreatitis 3-gallbladder disease slightly increased risk of gallstones 4-hypoglycemia-more common with individuals who are on insulin or sulfonylurea such as glipizide 5-acute kidney injury or worsening of chronic renal failure often this is triggered by severe vomiting and diarrhea as side effects to GLP-1. 6-slightly increased risk of diabetic retinopathy complications in patients with type 2 diabetes 7-heart rate increase-slight increase of base heart rate with GLP-1 8-suicidal behavior and ideation has been reported in clinical trials with other weight loss medicines.  If depression occurs with medication or suicidal ideation stop medication immediately notify provider or seek help.  Common side effects include nausea, vomiting, diarrhea, constipation and increased heart rate.  Less common side effects severe abdominal pain-if this occurs notify provider stop medication get tested for pancreatitis. Full discussion with your provider should occur before starting these medicines.       

## 2023-03-19 ENCOUNTER — Other Ambulatory Visit: Payer: Self-pay | Admitting: Family Medicine

## 2023-03-24 ENCOUNTER — Other Ambulatory Visit: Payer: Self-pay | Admitting: Family Medicine

## 2023-03-30 ENCOUNTER — Other Ambulatory Visit: Payer: Self-pay

## 2023-03-30 DIAGNOSIS — N189 Chronic kidney disease, unspecified: Secondary | ICD-10-CM

## 2023-03-30 NOTE — Progress Notes (Signed)
Referral placed in Epic per Dr Roby Lofts request, Will be referring to nephrology for chronic kidney disease-I would prefer Dr. Wolfgang Phoenix but for some reason family states that his office is no longer open-I do not believe that to be the case please have referral coordinator try to set him up with Dr. Wolfgang Phoenix

## 2023-04-05 ENCOUNTER — Other Ambulatory Visit: Payer: Self-pay | Admitting: Family Medicine

## 2023-04-23 ENCOUNTER — Other Ambulatory Visit: Payer: Self-pay | Admitting: Family Medicine

## 2023-04-26 DIAGNOSIS — N1831 Chronic kidney disease, stage 3a: Secondary | ICD-10-CM | POA: Diagnosis not present

## 2023-04-26 DIAGNOSIS — E559 Vitamin D deficiency, unspecified: Secondary | ICD-10-CM | POA: Diagnosis not present

## 2023-04-26 DIAGNOSIS — E1122 Type 2 diabetes mellitus with diabetic chronic kidney disease: Secondary | ICD-10-CM | POA: Diagnosis not present

## 2023-04-26 DIAGNOSIS — R809 Proteinuria, unspecified: Secondary | ICD-10-CM | POA: Diagnosis not present

## 2023-04-28 ENCOUNTER — Other Ambulatory Visit: Payer: Self-pay | Admitting: Family Medicine

## 2023-05-10 ENCOUNTER — Other Ambulatory Visit (HOSPITAL_COMMUNITY): Payer: Self-pay | Admitting: Nephrology

## 2023-05-10 DIAGNOSIS — E1122 Type 2 diabetes mellitus with diabetic chronic kidney disease: Secondary | ICD-10-CM

## 2023-05-10 DIAGNOSIS — R809 Proteinuria, unspecified: Secondary | ICD-10-CM

## 2023-05-10 DIAGNOSIS — N1831 Chronic kidney disease, stage 3a: Secondary | ICD-10-CM

## 2023-05-15 ENCOUNTER — Other Ambulatory Visit: Payer: Self-pay | Admitting: Family Medicine

## 2023-05-15 DIAGNOSIS — E7849 Other hyperlipidemia: Secondary | ICD-10-CM

## 2023-05-19 DIAGNOSIS — E559 Vitamin D deficiency, unspecified: Secondary | ICD-10-CM | POA: Diagnosis not present

## 2023-05-19 DIAGNOSIS — Q631 Lobulated, fused and horseshoe kidney: Secondary | ICD-10-CM | POA: Diagnosis not present

## 2023-05-19 DIAGNOSIS — I129 Hypertensive chronic kidney disease with stage 1 through stage 4 chronic kidney disease, or unspecified chronic kidney disease: Secondary | ICD-10-CM | POA: Diagnosis not present

## 2023-05-19 DIAGNOSIS — E1122 Type 2 diabetes mellitus with diabetic chronic kidney disease: Secondary | ICD-10-CM | POA: Diagnosis not present

## 2023-05-19 DIAGNOSIS — N1831 Chronic kidney disease, stage 3a: Secondary | ICD-10-CM | POA: Diagnosis not present

## 2023-05-19 DIAGNOSIS — N189 Chronic kidney disease, unspecified: Secondary | ICD-10-CM | POA: Diagnosis not present

## 2023-05-19 DIAGNOSIS — R809 Proteinuria, unspecified: Secondary | ICD-10-CM | POA: Diagnosis not present

## 2023-05-19 DIAGNOSIS — E1129 Type 2 diabetes mellitus with other diabetic kidney complication: Secondary | ICD-10-CM | POA: Diagnosis not present

## 2023-05-20 ENCOUNTER — Ambulatory Visit (HOSPITAL_COMMUNITY): Admission: RE | Admit: 2023-05-20 | Payer: Medicare HMO | Source: Ambulatory Visit

## 2023-05-20 DIAGNOSIS — R809 Proteinuria, unspecified: Secondary | ICD-10-CM | POA: Diagnosis not present

## 2023-05-20 DIAGNOSIS — N1831 Chronic kidney disease, stage 3a: Secondary | ICD-10-CM | POA: Diagnosis not present

## 2023-05-20 DIAGNOSIS — E1122 Type 2 diabetes mellitus with diabetic chronic kidney disease: Secondary | ICD-10-CM | POA: Diagnosis not present

## 2023-05-20 DIAGNOSIS — N183 Chronic kidney disease, stage 3 unspecified: Secondary | ICD-10-CM | POA: Diagnosis not present

## 2023-05-31 DIAGNOSIS — N390 Urinary tract infection, site not specified: Secondary | ICD-10-CM | POA: Diagnosis not present

## 2023-06-03 DIAGNOSIS — E1122 Type 2 diabetes mellitus with diabetic chronic kidney disease: Secondary | ICD-10-CM | POA: Diagnosis not present

## 2023-06-03 DIAGNOSIS — N1831 Chronic kidney disease, stage 3a: Secondary | ICD-10-CM | POA: Diagnosis not present

## 2023-06-03 DIAGNOSIS — R809 Proteinuria, unspecified: Secondary | ICD-10-CM | POA: Diagnosis not present

## 2023-06-03 DIAGNOSIS — N200 Calculus of kidney: Secondary | ICD-10-CM | POA: Diagnosis not present

## 2023-06-04 ENCOUNTER — Other Ambulatory Visit: Payer: Self-pay | Admitting: Family Medicine

## 2023-06-17 ENCOUNTER — Telehealth: Payer: Self-pay

## 2023-06-17 ENCOUNTER — Ambulatory Visit (INDEPENDENT_AMBULATORY_CARE_PROVIDER_SITE_OTHER): Payer: Medicare HMO

## 2023-06-17 VITALS — Ht 74.0 in | Wt 285.0 lb

## 2023-06-17 DIAGNOSIS — Z0000 Encounter for general adult medical examination without abnormal findings: Secondary | ICD-10-CM | POA: Diagnosis not present

## 2023-06-17 NOTE — Patient Instructions (Signed)
Adam Lee , Thank you for taking time to come for your Medicare Wellness Visit. I appreciate your ongoing commitment to your health goals. Please review the following plan we discussed and let me know if I can assist you in the future.   Referrals/Orders/Follow-Ups/Clinician Recommendations:   This is a list of the screening recommended for you and due dates:  Health Maintenance  Topic Date Due   DTaP/Tdap/Td vaccine (1 - Tdap) Never done   Zoster (Shingles) Vaccine (1 of 2) Never done   Flu Shot  Never done   Eye exam for diabetics  07/09/2023   Hemoglobin A1C  08/21/2023   Yearly kidney health urinalysis for diabetes  09/11/2023   Complete foot exam   09/11/2023   Yearly kidney function blood test for diabetes  02/18/2024   Medicare Annual Wellness Visit  06/16/2024   Colon Cancer Screening  01/13/2030   Pneumonia Vaccine  Completed   Hepatitis C Screening  Completed   HPV Vaccine  Aged Out   COVID-19 Vaccine  Discontinued    Advanced directives: (ACP Link)Information on Advanced Care Planning can be found at [facility] Advance Health Care Directives Advance Health Care Directives ([email or web address]   Next Medicare Annual Wellness Visit scheduled for next year: Your next Annual Wellness Visit will be on June 22, 2024 at 8:00am. This will be a virtual visit.    Preventive Care 69 Years and Older, Male Preventive care refers to lifestyle choices and visits with your health care provider that can promote health and wellness. Preventive care visits are also called wellness exams. What can I expect for my preventive care visit? Counseling During your preventive care visit, your health care provider may ask about your: Medical history, including: Past medical problems. Family medical history. History of falls. Current health, including: Emotional well-being. Home life and relationship well-being. Sexual activity. Memory and ability to understand  (cognition). Lifestyle, including: Alcohol, nicotine or tobacco, and drug use. Access to firearms. Diet, exercise, and sleep habits. Work and work Astronomer. Sunscreen use. Safety issues such as seatbelt and bike helmet use. Physical exam Your health care provider will check your: Height and weight. These may be used to calculate your BMI (body mass index). BMI is a measurement that tells if you are at a healthy weight. Waist circumference. This measures the distance around your waistline. This measurement also tells if you are at a healthy weight and may help predict your risk of certain diseases, such as type 2 diabetes and high blood pressure. Heart rate and blood pressure. Body temperature. Skin for abnormal spots. What immunizations do I need?  Vaccines are usually given at various ages, according to a schedule. Your health care provider will recommend vaccines for you based on your age, medical history, and lifestyle or other factors, such as travel or where you work. What tests do I need? Screening Your health care provider may recommend screening tests for certain conditions. This may include: Lipid and cholesterol levels. Diabetes screening. This is done by checking your blood sugar (glucose) after you have not eaten for a while (fasting). Hepatitis C test. Hepatitis B test. HIV (human immunodeficiency virus) test. STI (sexually transmitted infection) testing, if you are at risk. Lung cancer screening. Colorectal cancer screening. Prostate cancer screening. Abdominal aortic aneurysm (AAA) screening. You may need this if you are a current or former smoker. Talk with your health care provider about your test results, treatment options, and if necessary, the need for more  tests. Follow these instructions at home: Eating and drinking  Eat a diet that includes fresh fruits and vegetables, whole grains, lean protein, and low-fat dairy products. Limit your intake of foods with  high amounts of sugar, saturated fats, and salt. Take vitamin and mineral supplements as recommended by your health care provider. Do not drink alcohol if your health care provider tells you not to drink. If you drink alcohol: Limit how much you have to 0-2 drinks a day. Know how much alcohol is in your drink. In the [place] one drink equals one 12 oz bottle of beer (355 mL), one 5 oz glass of wine (148 mL), or one 1 oz glass of hard liquor (44 mL). Lifestyle Brush your teeth every morning and night with fluoride toothpaste. Floss one time each day. Exercise for at least 30 minutes 5 or more days each week. Do not use any products that contain nicotine or tobacco. These products include cigarettes, chewing tobacco, and vaping devices, such as e-cigarettes. If you need help quitting, ask your health care provider. Do not use drugs. If you are sexually active, practice safe sex. Use a condom or other form of protection to prevent STIs. Take aspirin only as told by your health care provider. Make sure that you understand how much to take and what form to take. Work with your health care provider to find out whether it is safe and beneficial for you to take aspirin daily. Ask your health care provider if you need to take a cholesterol-lowering medicine (statin). Find healthy ways to manage stress, such as: Meditation, yoga, or listening to music. Journaling. Talking to a trusted person. Spending time with friends and family. Safety Always wear your seat belt while driving or riding in a vehicle. Do not drive: If you have been drinking alcohol. Do not ride with someone who has been drinking. When you are tired or distracted. While texting. If you have been using any mind-altering substances or drugs. Wear a helmet and other protective equipment during sports activities. If you have firearms in your house, make sure you follow all gun safety procedures. Minimize exposure to UV radiation to  reduce your risk of skin cancer. What's next? Visit your health care provider once a year for an annual wellness visit. Ask your health care provider how often you should have your eyes and teeth checked. Stay up to date on all vaccines. This information is not intended to replace advice given to you by your health care provider. Make sure you discuss any questions you have with your health care provider. Document Revised: 03/18/2021 Document Reviewed: 03/18/2021 Elsevier Patient Education  2024 [employer]. Understanding Your Risk for Falls Millions of people have serious injuries from falls each year. It is important to understand your risk of falling. Talk with your health care provider about your risk and what you can do to lower it. If you do have a serious fall, make sure to tell your provider. Falling once raises your risk of falling again. How can falls affect me? Serious injuries from falls are common. These include: Broken bones, such as hip fractures. Head injuries, such as traumatic brain injuries (TBI) or concussions. A fear of falling can cause you to avoid activities and stay at home. This can make your muscles weaker and raise your risk for a fall. What can increase my risk? There are a number of risk factors that increase your risk for falling. The more risk factors you have, the higher  your risk of falling. Serious injuries from a fall happen most often to people who are older than 69 years old. Teenagers and young adults ages 54-29 are also at higher risk. Common risk factors include: Weakness in the lower body. Being generally weak or confused due to long-term (chronic) illness. Dizziness or balance problems. Poor vision. Medicines that cause dizziness or drowsiness. These may include: Medicines for your blood pressure, heart, anxiety, insomnia, or swelling (edema). Pain medicines. Muscle relaxants. Other risk factors include: Drinking alcohol. Having had a fall in  the past. Having foot pain or wearing improper footwear. Working at a dangerous job. Having any of the following in your home: Tripping hazards, such as floor clutter or loose rugs. Poor lighting. Pets. Having dementia or memory loss. What actions can I take to lower my risk of falling?     Physical activity Stay physically fit. Do strength and balance exercises. Consider taking a regular class to build strength and balance. Yoga and tai chi are good options. Vision Have your eyes checked every year and your prescription for glasses or contacts updated as needed. Shoes and walking aids Wear non-skid shoes. Wear shoes that have rubber soles and low heels. Do not wear high heels. Do not walk around the house in socks or slippers. Use a cane or walker as told by your provider. Home safety Attach secure railings on both sides of your stairs. Install grab bars for your bathtub, shower, and toilet. Use a non-skid mat in your bathtub or shower. Attach bath mats securely with double-sided, non-slip rug tape. Use good lighting in all rooms. Keep a flashlight near your bed. Make sure there is a clear path from your bed to the bathroom. Use night-lights. Do not use throw rugs. Make sure all carpeting is taped or tacked down securely. Remove all clutter from walkways and stairways, including extension cords. Repair uneven or broken steps and floors. Avoid walking on icy or slippery surfaces. Walk on the grass instead of on icy or slick sidewalks. Use ice melter to get rid of ice on walkways in the winter. Use a cordless phone. Questions to ask your health care provider Can you help me check my risk for a fall? Do any of my medicines make me more likely to fall? Should I take a vitamin D supplement? What exercises can I do to improve my strength and balance? Should I make an appointment to have my vision checked? Do I need a bone density test to check for weak bones (osteoporosis)? Would it  help to use a cane or a walker? Where to find more information Centers for Disease Control and Prevention, STEADI: TonerPromos.no Community-Based Fall Prevention Programs: TonerPromos.no [employer] on Aging: BaseRingTones.pl Contact a health care provider if: You fall at home. You are afraid of falling at home. You feel weak, drowsy, or dizzy. This information is not intended to replace advice given to you by your health care provider. Make sure you discuss any questions you have with your health care provider. Document Revised: 05/24/2022 Document Reviewed: 05/24/2022 Elsevier Patient Education  2024 Elsevier Inc. Preventing Diabetes Mellitus Complications You can help to prevent or slow down problems that are caused by diabetes (diabetes mellitus). If you follow your diabetes plan and take care of yourself, you can lower your chances of having severe problems. What actions can I take to prevent problems caused by diabetes? Managing your diabetes  Follow instructions from your health care providers about how to manage your diabetes.  You may have a team of health care providers. They can teach you how to care for yourself and can answer any questions you have. Learn about your condition. This can help you make healthy choices when it comes to eating and physical activity. Know your target range for your blood sugar (glucose). Check your blood glucose levels. Your health care provider will help you decide how often to check your levels. How often you check may depend on your goals for treatment and how well you are meeting them. Ask your health care provider if you should take a low dose of aspirin every day. Ask what dose is best for you. Taking a low dose of aspirin can help prevent heart disease. Controlling your blood pressure and cholesterol Your target blood pressure is based on: Your age. Your medicines. How long you have had diabetes. Other conditions you have. Controlling your cholesterol  may: Help prevent heart disease and stroke. Improve your blood flow. To control your blood pressure and cholesterol: Follow instructions from your health care provider about meal planning, exercise, and medicines. Make sure your health care provider checks your blood pressure at every visit. Monitor your blood pressure at home as told by your health care provider. Have your cholesterol checked at least once a year. A medicine called statin can help to lower your cholesterol. Ask your health care provider if you are or should be taking a statin.  Medical appointments and vaccines Have yearly physical exams and eye exams. Your health care providers will tell you how often you need to see them. It may depend on your diabetes plan. Every visit with a health care provider should include a measure of: Your weight. Your blood pressure. Your blood glucose. Your A1C level should be checked: At least 2 times a year, if you are meeting your treatment goals. 4 times a year, if you are not meeting treatment goals or if your goals have changed. Your blood lipids (lipid profile) should be checked once a year. You should also be checked once a year for protein in your urine (urine microalbumin). If you have type 1 diabetes, get an eye exam within 5 years after you are diagnosed. Get an exam once a year after that first exam. If you have type 2 diabetes, get an eye exam as soon as you are diagnosed. Get an exam once a year after that first exam. Keep your vaccines current. You should get: A flu vaccine every year. A pneumonia vaccine and a hepatitis B vaccine. If you are 65 years or older, you may get the pneumonia vaccine as a series of two shots. Ask your health care provider what other vaccines you may need to get. Keep all follow-up visits. This can help ensure that problems can be avoided or found early and treated. Lifestyle Do not use any products that contain nicotine or tobacco. These products  include cigarettes, chewing tobacco, and vaping devices, such as e-cigarettes. If you need help quitting, ask your health care provider. If you quit smoking, you may: Lower your risk for heart attack, stroke, nerve disease, and kidney disease. Help your blood move through your body better. Help your blood pressure and cholesterol levels. Do not drink alcohol if: Your health care provider tells you not to drink. You are pregnant, may be pregnant, or are planning to become pregnant. If you drink alcohol: Limit how much you have to: 0-1 drink a day for women. 0-2 drinks a day for men. Know how  much alcohol is in your drink. In the [place] one drink equals one 12 oz bottle of beer (355 mL), one 5 oz glass of wine (148 mL), or one 1 oz glass of hard liquor (44 mL). Taking care of your feet Diabetes may cause you to have poor blood flow to your legs and feet. It can also cause: The skin on your feet to get thinner, break more easily, and heal more slowly. Nerve damage in your legs and feet. This can result in less feeling. You may not notice small injuries. This could lead to bigger problems. To avoid foot problems: Check your skin and feet every day for cuts, bruises, redness, blisters, or sores. Have a foot exam with your health care provider once a year. During the exam, your health care provider will: Look at the structure and skin of your feet. Check your pulses and amount of feeling in your feet. Make sure that your health care provider does a visual foot exam at every visit.  Taking care of your teeth People who have diabetes that is not controlled well are more likely to have gum disease (periodontal disease). Diabetes can make gum disease harder to control. If not treated, it can lead to tooth loss. To prevent this: Brush your teeth twice a day. Floss at least once a day. Visit your dentist 2 times a year. Managing stress Living with diabetes can be stressful. When you are stressed,  you may: Have higher blood glucose because of stress hormones. Be distracted from taking good care of yourself. Be aware of your stress level and make changes to help you manage tough times. To lower your stress levels: Think about joining a support group. Do planned relaxation or meditation. Do a hobby that you enjoy. Maintain healthy relationships. Try to exercise every day. Work with your health care provider or a mental health professional. Where to find more information American Diabetes Association: diabetes.org Association of Diabetes Care & Education Specialists: diabeteseducator.org This information is not intended to replace advice given to you by your health care provider. Make sure you discuss any questions you have with your health care provider. Document Revised: 03/24/2022 Document Reviewed: 03/24/2022 Elsevier Patient Education  2024 [employer].

## 2023-06-17 NOTE — Telephone Encounter (Signed)
May have rx, recommend follow up within every 6 months here for doctor visit Dx htn Please write I will sign

## 2023-06-17 NOTE — Telephone Encounter (Signed)
Patient completed AWV today and is requesting a prescription for a home blood pressure monitor to check bp daily. Please contact patient if prescription is able to be sent in for him.  Thank you  Abby Germaine Shenker, CMA  [facility] AWV Team Direct Dial: [phone]

## 2023-06-17 NOTE — Progress Notes (Signed)
Because this visit was a virtual/telehealth visit,  certain criteria was not obtained, such a blood pressure, CBG if applicable, and timed get up and go. Any medications not marked as "taking" were not mentioned during the medication reconciliation part of the visit. Any vitals not documented were not able to be obtained due to this being a telehealth visit or patient was unable to self-report a recent blood pressure reading due to a lack of equipment at home via telehealth. Vitals that have been documented are verbally provided by the patient.   Subjective:   Adam Arakaki. is a 67 y.o. male who presents for Medicare Annual/Subsequent preventive examination.  Visit Complete: Virtual  I connected with  Adam Lee. on 06/17/23 by a audio enabled telemedicine application and verified that I am speaking with the correct person using two identifiers.  Patient Location: Home  Provider Location: Home Office  I discussed the limitations of evaluation and management by telemedicine. The patient expressed understanding and agreed to proceed.  Patient Medicare AWV questionnaire was completed by the patient on n/a; I have confirmed that all information answered by patient is correct and no changes since this date.  Cardiac Risk Factors include: advanced age (>60men, >85 women);diabetes mellitus;dyslipidemia;hypertension;male gender;obesity (BMI >30kg/m2)     Objective:    Today's Vitals   06/17/23 1134  Weight: 285 lb (129.3 kg)  Height: 6\' 2"  (1.88 m)   Body mass index is 36.59 kg/m.     06/17/2023   11:37 AM 01/14/2023    7:41 AM 01/12/2023   11:06 AM 04/13/2022    9:29 AM 04/07/2021    9:46 AM 10/27/2020    1:33 PM 01/23/2020    7:32 AM  Advanced Directives  Does Patient Have a Medical Advance Directive? No No No No No No No  Would patient like information on creating a medical advance directive? No - Patient declined No - Patient declined No - Patient declined No - Patient  declined No - Patient declined  No - Patient declined    Current Medications (verified) Outpatient Encounter Medications as of 06/17/2023  Medication Sig   aspirin 81 MG chewable tablet Chew 81 mg by mouth daily.   atorvastatin (LIPITOR) 10 MG tablet TAKE 1 TABLET BY MOUTH EVERY DAY   blood glucose meter kit and supplies Dispense based on patient and insurance preference. Use to check sugars once a day (FOR ICD-10 E10.9, E11.9).   glipiZIDE (GLUCOTROL XL) 5 MG 24 hr tablet TAKE 1 TABLET BY MOUTH EVERY DAY WITH BREAKFAST   Lancets (ONETOUCH DELICA PLUS LANCET33G) MISC USE TO OBTAIN A BLOOD SPECIMEN TO TEST BLOOD SUGAR DAILY   lisinopril (ZESTRIL) 10 MG tablet TAKE 1 TABLET BY MOUTH EVERY DAY   metFORMIN (GLUCOPHAGE) 500 MG tablet TAKE 2 TABLETS BY MOUTH TWICE A DAY   metoprolol tartrate (LOPRESSOR) 25 MG tablet Take 12.5 mg by mouth daily.   Vitamin D, Ergocalciferol, (DRISDOL) 1.25 MG (50000 UNIT) CAPS capsule TAKE 1 CAPSULE (50,000 UNITS TOTAL) BY MOUTH EVERY 7 (SEVEN) DAYS   No facility-administered encounter medications on file as of 06/17/2023.    Allergies (verified) Patient has no known allergies.   History: Past Medical History:  Diagnosis Date   Anginal pain (HCC) 2019   Diabetes mellitus without complication (HCC)    Dysrhythmia    stress related. fast HR   History of stress test 2014   intermediate risk   Hyperlipemia    Hypertension    Prostate  cancer [facility])    Past Surgical History:  Procedure Laterality Date   COLONOSCOPY N/A 01/03/2020   Procedure: COLONOSCOPY;  Surgeon: Malissa Hippo, MD;  [facility];  Service: Endoscopy;  Laterality: N/A;  1055   COLONOSCOPY WITH PROPOFOL N/A 01/14/2023   Procedure: COLONOSCOPY WITH PROPOFOL;  Surgeon: Dolores Frame, MD;  [facility];  Service: Gastroenterology;  Laterality: N/A;  900am, asa 1-2   CYSTOSCOPY W/ RETROGRADES Bilateral 09/24/2021   Procedure: CYSTOSCOPY WITH PYELOGRAM;  Surgeon:  Malen Gauze, MD;  Location: [facility];  Service: Urology;  Laterality: Bilateral;   CYSTOSCOPY WITH BIOPSY N/A 09/24/2021   Procedure: CYSTOSCOPY WITH BLADDER BIOPSY;  Surgeon: Malen Gauze, MD;  Location: [facility];  Service: Urology;  Laterality: N/A;   CYSTOSCOPY WITH FULGERATION N/A 09/24/2021   Procedure: CYSTOSCOPY WITH FULGERATION;  Surgeon: Malen Gauze, MD;  Location: [facility];  Service: Urology;  Laterality: N/A;   HERNIA REPAIR Left    2000, Dr. Franky Macho   LYMPHADENECTOMY Bilateral 01/23/2020   Procedure: LYMPHADENECTOMY;  Surgeon: Sebastian Ache, MD;  Location: [facility];  Service: Urology;  Laterality: Bilateral;   POLYPECTOMY  01/03/2020   Procedure: POLYPECTOMY;  Surgeon: Malissa Hippo, MD;  [facility];  Service: Endoscopy;;   POLYPECTOMY  01/14/2023   Procedure: POLYPECTOMY;  Surgeon: Dolores Frame, MD;  [facility];  Service: Gastroenterology;;   ROBOT ASSISTED LAPAROSCOPIC RADICAL PROSTATECTOMY N/A 01/23/2020   Procedure: XI ROBOTIC ASSISTED LAPAROSCOPIC RADICAL PROSTATECTOMY;  Surgeon: Sebastian Ache, MD;  Location[facility];  Service: Urology;  Laterality: N/A;  3 HRS   Family History  Problem Relation Age of Onset   Diabetes Father    Heart disease Mother    Cancer Other    Breast cancer Neg Hx    Prostate cancer Neg Hx    Colon cancer Neg Hx    Pancreatic cancer Neg Hx    Social History   Socioeconomic History   Marital status: Married    Spouse name: Not on file   Number of children: 2   Years of education: Not on file   Highest education level: Not on file  Occupational History   Not on file  Tobacco Use   Smoking status: Never   Smokeless tobacco: Never  Vaping Use   Vaping status: Never Used  Substance and Sexual Activity   Alcohol use: No    Alcohol/week: 0.0 standard drinks of alcohol   Drug use: No   Sexual activity: Yes  Other Topics Concern   Not on file  Social History Narrative   Not on  file   Social Determinants of Health   Financial Resource Strain: Low Risk  (06/17/2023)   Overall Financial Resource Strain (CARDIA)    Difficulty of Paying Living Expenses: Not hard at all  Food Insecurity: No Food Insecurity (06/17/2023)   Hunger Vital Sign    Worried About Running Out of Food in the Last Year: Never true    Ran Out of Food in the Last Year: Never true  Transportation Needs: No Transportation Needs (06/17/2023)   PRAPARE - Administrator, Civil Service (Medical): No    Lack of Transportation (Non-Medical): No  Physical Activity: Sufficiently Active (06/17/2023)   Exercise Vital Sign    Days of Exercise per Week: 7 days    Minutes of Exercise per Session: 30 min  Stress: No Stress Concern Present (06/17/2023)   [employer]  of Occupational Health - Occupational Stress Questionnaire    Feeling of Stress : Not at all  Social Connections: Socially Integrated (06/17/2023)   Social Connection and Isolation Panel [NHANES]    Frequency of Communication with Friends and Family: More than three times a week    Frequency of Social Gatherings with Friends and Family: More than three times a week    Attends Religious Services: More than 4 times per year    Active Member of [employer] or Organizations: Yes    Attends Engineer, structural: More than 4 times per year    Marital Status: Married    Tobacco Counseling Counseling given: Yes   Clinical Intake:  Pre-visit preparation completed: Yes  Pain : No/denies pain     BMI - recorded: 36.59 Nutritional Status: BMI > 30  Obese Nutritional Risks: None Diabetes: Yes CBG done?: No (telehealth visit. unable to obtain cbg) Did pt. bring in CBG monitor from home?: No  How often do you need to have someone help you when you read instructions, pamphlets, or other written materials from your doctor or pharmacy?: 1 - Never  Interpreter Needed?: No  Information entered by :: Abby Hartford Maulden,  CMA   Activities of Daily Living    06/17/2023   11:36 AM 01/12/2023   11:10 AM  In your present state of health, do you have any difficulty performing the following activities:  Hearing? 0   Vision? 0   Difficulty concentrating or making decisions? 0   Walking or climbing stairs? 0   Dressing or bathing? 0   Doing errands, shopping? 0 0  Preparing Food and eating ? N   Using the Toilet? N   In the past six months, have you accidently leaked urine? N   Do you have problems with loss of bowel control? N   Managing your Medications? N   Managing your Finances? N   Housekeeping or managing your Housekeeping? N     Patient Care Team: Babs Sciara, MD as PCP - General (Family Medicine) Wendall Stade, MD as Consulting Physician (Cardiology) Ronne Binning Mardene Celeste, MD as Consulting Physician (Urology) Marinus Maw, MD as Consulting Physician (Cardiology) Marguerita Merles, Reuel Boom, MD as Consulting Physician (Gastroenterology)  Indicate any recent Medical Services you may have received from other than [facility] providers in the past year (date may be approximate).     Assessment:   This is a routine wellness examination for Adam Lee.  Hearing/Vision screen Hearing Screening - Comments:: Patient denies any hearing difficulties.   Vision Screening - Comments:: Patient states he is unable to get in to see DR. Cotter for his yearly eye exam because they are booked up until the first of the year.    Goals Addressed             This Visit's Progress    Patient Stated       Get my kidney function back where it needs to be       Depression Screen    06/17/2023   11:38 AM 03/11/2023   10:16 AM 11/24/2022    3:54 PM 11/24/2022    3:19 PM 09/10/2022   11:09 AM 04/13/2022    9:27 AM 04/07/2021    9:48 AM  PHQ 2/9 Scores  PHQ - 2 Score 0 0 0 0 0 1 0  PHQ- 9 Score 0   0 2      Fall Risk    06/17/2023   11:37 AM 03/11/2023  10:15 AM 11/24/2022    3:54 PM 11/24/2022    3:19 PM  09/10/2022   11:09 AM  Fall Risk   Falls in the past year? 0 0 0 0 0  Number falls in past yr: 0 0 0 0 0  Injury with Fall? 0 0 0 0 0  Risk for fall due to : No Fall Risks    No Fall Risks  Follow up Falls prevention discussed    Falls evaluation completed    MEDICARE RISK AT HOME: Medicare Risk at Home Any stairs in or around the home?: No If so, are there any without handrails?: No Home free of loose throw rugs in walkways, pet beds, electrical cords, etc?: Yes Adequate lighting in your home to reduce risk of falls?: Yes Life alert?: No Use of a cane, walker or w/c?: No Grab bars in the bathroom?: No Shower chair or bench in shower?: No Elevated toilet seat or a handicapped toilet?: Yes  TIMED UP AND GO:  Was the test performed?  No    Cognitive Function:        06/17/2023   11:38 AM 04/13/2022    9:31 AM  6CIT Screen  What Year? 0 points 0 points  What month? 0 points 0 points  What time? 0 points 0 points  Count back from 20 0 points 0 points  Months in reverse 0 points 0 points  Repeat phrase 0 points 0 points  Total Score 0 points 0 points    Immunizations Immunization History  Administered Date(s) Administered   Pneumococcal Conjugate-13 12/05/2018   Pneumococcal Polysaccharide-23 03/11/2020    TDAP status: Due, Education has been provided regarding the importance of this vaccine. Advised may receive this vaccine at local pharmacy or Health Dept. Aware to provide a copy of the vaccination record if obtained from local pharmacy or Health Dept. Verbalized acceptance and understanding.  Flu Vaccine status: Due, Education has been provided regarding the importance of this vaccine. Advised may receive this vaccine at local pharmacy or Health Dept. Aware to provide a copy of the vaccination record if obtained from local pharmacy or Health Dept. Verbalized acceptance and understanding.  Pneumococcal vaccine status: Up to date  Covid-19 vaccine status: Information  provided on how to obtain vaccines.   Qualifies for Shingles Vaccine? Yes   Zostavax completed No   Shingrix Completed?: No.    Education has been provided regarding the importance of this vaccine. Patient has been advised to call insurance company to determine out of pocket expense if they have not yet received this vaccine. Advised may also receive vaccine at local pharmacy or Health Dept. Verbalized acceptance and understanding.  Screening Tests Health Maintenance  Topic Date Due   DTaP/Tdap/Td (1 - Tdap) Never done   Zoster Vaccines- Shingrix (1 of 2) Never done   Medicare Annual Wellness (AWV)  04/14/2023   INFLUENZA VACCINE  Never done   OPHTHALMOLOGY EXAM  07/09/2023   HEMOGLOBIN A1C  08/21/2023   Diabetic kidney evaluation - Urine ACR  09/11/2023   FOOT EXAM  09/11/2023   Diabetic kidney evaluation - eGFR measurement  02/18/2024   Colonoscopy  01/13/2030   Pneumonia Vaccine 36+ Years old  Completed   Hepatitis C Screening  Completed   HPV VACCINES  Aged Out   COVID-19 Vaccine  Discontinued    Health Maintenance  Health Maintenance Due  Topic Date Due   DTaP/Tdap/Td (1 - Tdap) Never done   Zoster Vaccines- Shingrix (1 of  2) Never done   Medicare Annual Wellness (AWV)  04/14/2023   INFLUENZA VACCINE  Never done    Colorectal cancer screening: Type of screening: Colonoscopy. Completed 01/14/2023. Repeat every 10 years  Lung Cancer Screening: (Low Dose CT Chest recommended if Age 62-80 years, 20 pack-year currently smoking OR have quit w/in 15years.) does not qualify.   Additional Screening:  Hepatitis C Screening: does not qualify; Completed 12/05/2018  Vision Screening: Recommended annual ophthalmology exams for early detection of glaucoma and other disorders of the eye. Is the patient up to date with their annual eye exam?  Yes  Who is the provider or what is the name of the office in which the patient attends annual eye exams? Daisy Lazar w/ [facility]  [place]  Dental Screening: Recommended annual dental exams for proper oral hygiene  Diabetic Foot Exam: Diabetic Foot Exam: Completed 09/10/2022  Community Resource Referral / Chronic Care Management: CRR required this visit?  No   CCM required this visit?  No     Plan:     I have personally reviewed and noted the following in the patient's chart:   Medical and social history Use of alcohol, tobacco or illicit drugs  Current medications and supplements including opioid prescriptions. Patient is not currently taking opioid prescriptions. Functional ability and status Nutritional status Physical activity Advanced directives List of other physicians Hospitalizations, surgeries, and ER visits in previous 12 months Vitals Screenings to include cognitive, depression, and falls Referrals and appointments  In addition, I have reviewed and discussed with patient certain preventive protocols, quality metrics, and best practice recommendations. A written personalized care plan for preventive services as well as general preventive health recommendations were provided to patient.     Adam Lee, CMA   06/17/2023   After Visit Summary: (MyChart) Due to this being a telephonic visit, the after visit summary with patients personalized plan was offered to patient via MyChart   Nurse Notes:

## 2023-07-01 NOTE — Telephone Encounter (Signed)
Script written and waiting for signature

## 2023-07-01 NOTE — Telephone Encounter (Signed)
Completed thank you

## 2023-09-09 DIAGNOSIS — E1122 Type 2 diabetes mellitus with diabetic chronic kidney disease: Secondary | ICD-10-CM | POA: Diagnosis not present

## 2023-09-09 DIAGNOSIS — N189 Chronic kidney disease, unspecified: Secondary | ICD-10-CM | POA: Diagnosis not present

## 2023-09-09 DIAGNOSIS — E1129 Type 2 diabetes mellitus with other diabetic kidney complication: Secondary | ICD-10-CM | POA: Diagnosis not present

## 2023-09-09 DIAGNOSIS — D649 Anemia, unspecified: Secondary | ICD-10-CM | POA: Diagnosis not present

## 2023-09-09 DIAGNOSIS — I129 Hypertensive chronic kidney disease with stage 1 through stage 4 chronic kidney disease, or unspecified chronic kidney disease: Secondary | ICD-10-CM | POA: Diagnosis not present

## 2023-09-09 DIAGNOSIS — N1831 Chronic kidney disease, stage 3a: Secondary | ICD-10-CM | POA: Diagnosis not present

## 2023-09-16 DIAGNOSIS — E559 Vitamin D deficiency, unspecified: Secondary | ICD-10-CM | POA: Diagnosis not present

## 2023-09-16 DIAGNOSIS — R809 Proteinuria, unspecified: Secondary | ICD-10-CM | POA: Diagnosis not present

## 2023-09-16 DIAGNOSIS — E1122 Type 2 diabetes mellitus with diabetic chronic kidney disease: Secondary | ICD-10-CM | POA: Diagnosis not present

## 2023-09-16 DIAGNOSIS — Z6836 Body mass index (BMI) 36.0-36.9, adult: Secondary | ICD-10-CM | POA: Diagnosis not present

## 2023-09-16 DIAGNOSIS — Z008 Encounter for other general examination: Secondary | ICD-10-CM | POA: Diagnosis not present

## 2023-09-16 DIAGNOSIS — I129 Hypertensive chronic kidney disease with stage 1 through stage 4 chronic kidney disease, or unspecified chronic kidney disease: Secondary | ICD-10-CM | POA: Diagnosis not present

## 2023-09-16 DIAGNOSIS — Z7984 Long term (current) use of oral hypoglycemic drugs: Secondary | ICD-10-CM | POA: Diagnosis not present

## 2023-09-16 DIAGNOSIS — Z833 Family history of diabetes mellitus: Secondary | ICD-10-CM | POA: Diagnosis not present

## 2023-09-16 DIAGNOSIS — E1140 Type 2 diabetes mellitus with diabetic neuropathy, unspecified: Secondary | ICD-10-CM | POA: Diagnosis not present

## 2023-09-16 DIAGNOSIS — N529 Male erectile dysfunction, unspecified: Secondary | ICD-10-CM | POA: Diagnosis not present

## 2023-09-16 DIAGNOSIS — E785 Hyperlipidemia, unspecified: Secondary | ICD-10-CM | POA: Diagnosis not present

## 2023-09-16 DIAGNOSIS — E669 Obesity, unspecified: Secondary | ICD-10-CM | POA: Diagnosis not present

## 2023-09-16 DIAGNOSIS — N1831 Chronic kidney disease, stage 3a: Secondary | ICD-10-CM | POA: Diagnosis not present

## 2023-09-16 DIAGNOSIS — I2510 Atherosclerotic heart disease of native coronary artery without angina pectoris: Secondary | ICD-10-CM | POA: Diagnosis not present

## 2023-09-16 DIAGNOSIS — Z7982 Long term (current) use of aspirin: Secondary | ICD-10-CM | POA: Diagnosis not present

## 2023-09-16 LAB — LAB REPORT - SCANNED
Albumin, Urine POC: <30
EGFR: 51

## 2023-09-21 ENCOUNTER — Other Ambulatory Visit: Payer: Self-pay | Admitting: Family Medicine

## 2023-09-27 ENCOUNTER — Other Ambulatory Visit: Payer: Self-pay | Admitting: Family Medicine

## 2023-10-16 ENCOUNTER — Telehealth: Payer: Self-pay | Admitting: Family Medicine

## 2023-10-16 NOTE — Telephone Encounter (Signed)
 Nurses Patient is due for a diabetic checkup He has not had this recently Overdue for blood work Recommend A1c, lipid, liver, metabolic 7, urine ACR Diabetes hypertension tension hyperlipidemia I recommend patient do the lab work in the near future in the follow-up office visit to be scheduled (More likely due to the busyness of the schedule this will be in February) If you are unable to connect with the patient at the very least send him a MyChart message or a letter letting him know that he needs to do follow-up labs at an office visit thank you

## 2023-10-17 ENCOUNTER — Encounter: Payer: Self-pay | Admitting: *Deleted

## 2023-10-17 NOTE — Telephone Encounter (Signed)
 Patient notified via mychart

## 2023-11-13 ENCOUNTER — Other Ambulatory Visit: Payer: Self-pay | Admitting: Family Medicine

## 2023-11-13 DIAGNOSIS — E7849 Other hyperlipidemia: Secondary | ICD-10-CM

## 2023-11-14 ENCOUNTER — Other Ambulatory Visit: Payer: Self-pay

## 2023-11-14 DIAGNOSIS — E7849 Other hyperlipidemia: Secondary | ICD-10-CM

## 2023-11-14 MED ORDER — ATORVASTATIN CALCIUM 10 MG PO TABS
10.0000 mg | ORAL_TABLET | Freq: Every day | ORAL | 1 refills | Status: DC
Start: 2023-11-14 — End: 2024-05-10

## 2023-12-22 ENCOUNTER — Other Ambulatory Visit: Payer: Self-pay | Admitting: Family Medicine

## 2023-12-22 ENCOUNTER — Other Ambulatory Visit: Payer: Self-pay

## 2023-12-22 MED ORDER — LISINOPRIL 10 MG PO TABS: 10.0000 mg | ORAL_TABLET | Freq: Every day | ORAL | 1 refills | Status: DC

## 2024-01-20 ENCOUNTER — Other Ambulatory Visit: Payer: Medicare HMO

## 2024-01-20 DIAGNOSIS — E1122 Type 2 diabetes mellitus with diabetic chronic kidney disease: Secondary | ICD-10-CM | POA: Diagnosis not present

## 2024-01-20 DIAGNOSIS — N1831 Chronic kidney disease, stage 3a: Secondary | ICD-10-CM | POA: Diagnosis not present

## 2024-01-20 DIAGNOSIS — E1129 Type 2 diabetes mellitus with other diabetic kidney complication: Secondary | ICD-10-CM | POA: Diagnosis not present

## 2024-01-20 DIAGNOSIS — R809 Proteinuria, unspecified: Secondary | ICD-10-CM | POA: Diagnosis not present

## 2024-01-27 ENCOUNTER — Ambulatory Visit: Payer: Medicare HMO | Admitting: Urology

## 2024-01-27 VITALS — BP 121/77 | HR 83

## 2024-01-27 DIAGNOSIS — C61 Malignant neoplasm of prostate: Secondary | ICD-10-CM

## 2024-01-27 DIAGNOSIS — R350 Frequency of micturition: Secondary | ICD-10-CM

## 2024-01-27 DIAGNOSIS — N3941 Urge incontinence: Secondary | ICD-10-CM

## 2024-01-27 LAB — URINALYSIS, ROUTINE W REFLEX MICROSCOPIC
Bilirubin, UA: NEGATIVE
Glucose, UA: NEGATIVE
Ketones, UA: NEGATIVE
Nitrite, UA: POSITIVE — AB
Protein,UA: NEGATIVE
RBC, UA: NEGATIVE
Specific Gravity, UA: 1.03 (ref 1.005–1.030)
Urobilinogen, Ur: 0.2 mg/dL (ref 0.2–1.0)
pH, UA: 6 (ref 5.0–7.5)

## 2024-01-27 LAB — MICROSCOPIC EXAMINATION

## 2024-01-27 NOTE — Patient Instructions (Signed)

## 2024-01-27 NOTE — Progress Notes (Signed)
 01/27/2024 9:38 AM   Adam Lee. 02/12/1956 [id number]  Referring provider: Bennet Brasil, MD [address] [place],  [place] [address]  Followup Prostate cancer   HPI: Adam Lee is a 68yo here for followup for prostate cancer and OAB. NO recent PSA. No hematuria or dysuria. Last  PSA 1 year ago was undetectable. He has mild urge incontinence. He drinks 1 pot of coffee daily   PMH: Past Medical History:  Diagnosis Date   Anginal pain (HCC) 2019   Diabetes mellitus without complication (HCC)    Dysrhythmia    stress related. fast HR   History of stress test 2014   intermediate risk   Hyperlipemia    Hypertension    Prostate cancer [facility])     Surgical History: Past Surgical History:  Procedure Laterality Date   COLONOSCOPY N/A 01/03/2020   Procedure: COLONOSCOPY;  Surgeon: Ruby Corporal, MD;  [facility];  Service: Endoscopy;  Laterality: N/A;  1055   COLONOSCOPY WITH PROPOFOL  N/A 01/14/2023   Procedure: COLONOSCOPY WITH PROPOFOL ;  Surgeon: Urban Garden, MD;  [facility];  Service: Gastroenterology;  Laterality: N/A;  900am, asa 1-2   CYSTOSCOPY W/ RETROGRADES Bilateral 09/24/2021   Procedure: CYSTOSCOPY WITH PYELOGRAM;  Surgeon: Marco Severs, MD;  Location: [facility];  Service: Urology;  Laterality: Bilateral;   CYSTOSCOPY WITH BIOPSY N/A 09/24/2021   Procedure: CYSTOSCOPY WITH BLADDER BIOPSY;  Surgeon: Marco Severs, MD;  Location[facility];  Service: Urology;  Laterality: N/A;   CYSTOSCOPY WITH FULGERATION N/A 09/24/2021   Procedure: CYSTOSCOPY WITH FULGERATION;  Surgeon: Marco Severs, MD;  Location: [facility];  Service: Urology;  Laterality: N/A;   HERNIA REPAIR Left    2000, Dr. Alanda Allegra   LYMPHADENECTOMY Bilateral 01/23/2020   Procedure: LYMPHADENECTOMY;  Surgeon: Osborn Blaze, MD;  [facility];  Service: Urology;  Laterality: Bilateral;   POLYPECTOMY  01/03/2020   Procedure: POLYPECTOMY;   Surgeon: Ruby Corporal, MD;  [facility];  Service: Endoscopy;;   POLYPECTOMY  01/14/2023   Procedure: POLYPECTOMY;  Surgeon: Urban Garden, MD;  [facility];  Service: Gastroenterology;;   ROBOT ASSISTED LAPAROSCOPIC RADICAL PROSTATECTOMY N/A 01/23/2020   Procedure: XI ROBOTIC ASSISTED LAPAROSCOPIC RADICAL PROSTATECTOMY;  Surgeon: Osborn Blaze, MD;  Location[facility];  Service: Urology;  Laterality: N/A;  3 HRS    Home Medications:  Allergies as of 01/27/2024   No Known Allergies      Medication List        Accurate as of January 27, 2024  9:38 AM. If you have any questions, ask your nurse or doctor.          aspirin  81 MG chewable tablet Chew 81 mg by mouth daily.   atorvastatin  10 MG tablet Commonly known as: LIPITOR Take 1 tablet (10 mg total) by mouth daily.   blood glucose meter kit and supplies Dispense based on patient and insurance preference. Use to check sugars once a day (FOR ICD-10 E10.9, E11.9).   glipiZIDE  5 MG 24 hr tablet Commonly known as: GLUCOTROL  XL TAKE 1 TABLET BY MOUTH EVERY DAY WITH BREAKFAST   lisinopril  10 MG tablet Commonly known as: ZESTRIL  Take 1 tablet (10 mg total) by mouth daily.   metFORMIN  500 MG tablet Commonly known as: GLUCOPHAGE  TAKE 2 TABLETS BY MOUTH TWICE A DAY   metoprolol  tartrate 25 MG tablet Commonly known as: LOPRESSOR  Take 12.5 mg  by mouth daily.   OneTouch Delica Plus Lancet33G Misc USE TO OBTAIN A BLOOD SPECIMEN TO TEST BLOOD SUGAR DAILY   Vitamin D  (Ergocalciferol ) 1.25 MG (50000 UNIT) Caps capsule Commonly known as: DRISDOL  TAKE 1 CAPSULE (50,000 UNITS TOTAL) BY MOUTH EVERY 7 (SEVEN) DAYS        Allergies: No Known Allergies  Family History: Family History  Problem Relation Age of Onset   Diabetes Father    Heart disease Mother    Cancer Other    Breast cancer Neg Hx    Prostate cancer Neg Hx    Colon cancer Neg Hx    Pancreatic cancer Neg Hx     Social  History:  reports that he has never smoked. He has never used smokeless tobacco. He reports that he does not drink alcohol and does not use drugs.  ROS: All other review of systems were reviewed and are negative except what is noted above in HPI  Physical Exam: BP 121/77   Pulse 83   Constitutional:  Alert and oriented, No acute distress. HEENT: [place] AT, moist mucus membranes.  Trachea midline, no masses. Cardiovascular: No clubbing, cyanosis, or edema. Respiratory: Normal respiratory effort, no increased work of breathing. GI: Abdomen is soft, nontender, nondistended, no abdominal masses GU: No CVA tenderness.  Lymph: No cervical or inguinal lymphadenopathy. Skin: No rashes, bruises or suspicious lesions. Neurologic: Grossly intact, no focal deficits, moving all 4 extremities. Psychiatric: Normal mood and affect.  Laboratory Data: Lab Results  Component Value Date   WBC 8.0 11/24/2022   HGB 11.5 11/24/2022   HCT 41.3 11/24/2022   MCV 88 11/24/2022   PLT 280 11/24/2022    Lab Results  Component Value Date   CREATININE 1.65 (H) 02/18/2023    Lab Results  Component Value Date   PSA <0.1 03/04/2020   PSA 2.3 12/06/2014    Lab Results  Component Value Date   TESTOSTERONE  594 04/17/2021    Lab Results  Component Value Date   HGBA1C 6.8 (H) 02/18/2023    Urinalysis    Component Value Date/Time   APPEARANCEUR Clear 01/28/2023 1059   GLUCOSEU Negative 01/28/2023 1059   BILIRUBINUR Negative 01/28/2023 1059   PROTEINUR 3+ (A) 01/28/2023 1059   NITRITE Negative 01/28/2023 1059   LEUKOCYTESUR 1+ (A) 01/28/2023 1059    Lab Results  Component Value Date   LABMICR See below: 01/28/2023   WBCUA 11-30 (A) 01/28/2023   LABEPIT 0-10 01/28/2023   MUCUS Present 12/18/2021   BACTERIA Few (A) 01/28/2023    Pertinent Imaging:  No results found for this or any previous visit.  No results found for this or any previous visit.  No results found for this or any previous  visit.  No results found for this or any previous visit.  Results for orders placed during the hospital encounter of 05/20/23  US  RENAL  Narrative CLINICAL DATA:  Stage 3 chronic kidney disease.  EXAM: RENAL / URINARY TRACT ULTRASOUND COMPLETE  COMPARISON:  CT abdomen pelvis November 29, 2019  FINDINGS: Right Kidney:  Renal measurements: 11.7 x 4.5 x 5 cm = volume: 1362 mL. Questioned 3 mm stone. Echogenicity within normal limits. No mass or hydronephrosis visualized.  Left Kidney:  Renal measurements: 11.8 x 5 5 x 5 cm = volume: 169.7 mL. Echogenicity within normal limits. No mass or hydronephrosis visualized.  Bladder:  The bladder is not well-distended.  Other:  None. 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  IMPRESSION: 1. No acute abnormality identified. No hydronephrosis bilaterally. 2. Questioned 3 mm stone in right kidney. 3. 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   Electronically Signed By: Anna Barnes M.D. On: 05/20/2023 15:19  No results found for this or any previous visit.  No results found for this or any previous visit.  No results found for this or any previous visit.   Assessment & Plan:    1. Prostate cancer (HCC) (Primary) -PSA today -followup 1 year with PSA  - Urinalysis, Routine w reflex microscopic  2. Urge incontinence Patient defers therapy at this time   No follow-ups on file.  Johnie Nailer, MD  [facility] [place]

## 2024-01-28 LAB — PSA: Prostate Specific Ag, Serum: <0.1 ng/mL (ref 0.0–4.0)

## 2024-01-29 LAB — URINE CULTURE

## 2024-01-31 NOTE — Progress Notes (Signed)
 Letter sent.

## 2024-02-02 ENCOUNTER — Encounter: Payer: Self-pay | Admitting: Urology

## 2024-03-02 LAB — HM DIABETES EYE EXAM

## 2024-04-01 ENCOUNTER — Other Ambulatory Visit: Payer: Self-pay | Admitting: Family Medicine

## 2024-04-02 ENCOUNTER — Other Ambulatory Visit: Payer: Self-pay | Admitting: Family Medicine

## 2024-04-02 MED ORDER — PERMETHRIN 5 % EX CREA: 1.0000 | TOPICAL_CREAM | Freq: Once | CUTANEOUS | 1 refills | Status: AC

## 2024-04-02 NOTE — Progress Notes (Signed)
 Patient's wife was being seen with severe itching this could be scabies we will go ahead and treat both her and the patient husband

## 2024-04-03 ENCOUNTER — Other Ambulatory Visit: Payer: Self-pay

## 2024-04-03 MED ORDER — PERMETHRIN 5 % EX CREA: 1.0000 | TOPICAL_CREAM | CUTANEOUS | 1 refills | Status: AC | PRN

## 2024-04-10 ENCOUNTER — Other Ambulatory Visit: Payer: Self-pay | Admitting: Family Medicine

## 2024-04-17 DIAGNOSIS — N1831 Chronic kidney disease, stage 3a: Secondary | ICD-10-CM | POA: Diagnosis not present

## 2024-04-17 DIAGNOSIS — N529 Male erectile dysfunction, unspecified: Secondary | ICD-10-CM | POA: Diagnosis not present

## 2024-04-17 DIAGNOSIS — E1165 Type 2 diabetes mellitus with hyperglycemia: Secondary | ICD-10-CM | POA: Diagnosis not present

## 2024-04-17 DIAGNOSIS — Z833 Family history of diabetes mellitus: Secondary | ICD-10-CM | POA: Diagnosis not present

## 2024-04-17 DIAGNOSIS — Z7984 Long term (current) use of oral hypoglycemic drugs: Secondary | ICD-10-CM | POA: Diagnosis not present

## 2024-04-17 DIAGNOSIS — K5900 Constipation, unspecified: Secondary | ICD-10-CM | POA: Diagnosis not present

## 2024-04-17 DIAGNOSIS — Z8249 Family history of ischemic heart disease and other diseases of the circulatory system: Secondary | ICD-10-CM | POA: Diagnosis not present

## 2024-04-17 DIAGNOSIS — E1122 Type 2 diabetes mellitus with diabetic chronic kidney disease: Secondary | ICD-10-CM | POA: Diagnosis not present

## 2024-04-17 DIAGNOSIS — Z7982 Long term (current) use of aspirin: Secondary | ICD-10-CM | POA: Diagnosis not present

## 2024-04-17 DIAGNOSIS — Z809 Family history of malignant neoplasm, unspecified: Secondary | ICD-10-CM | POA: Diagnosis not present

## 2024-04-17 DIAGNOSIS — E1142 Type 2 diabetes mellitus with diabetic polyneuropathy: Secondary | ICD-10-CM | POA: Diagnosis not present

## 2024-04-17 LAB — MICROALBUMIN / CREATININE URINE RATIO: Microalb Creat Ratio: <30

## 2024-05-10 ENCOUNTER — Other Ambulatory Visit: Payer: Self-pay | Admitting: Family Medicine

## 2024-05-10 DIAGNOSIS — E7849 Other hyperlipidemia: Secondary | ICD-10-CM

## 2024-05-16 ENCOUNTER — Other Ambulatory Visit: Payer: Self-pay | Admitting: Family Medicine

## 2024-05-18 ENCOUNTER — Ambulatory Visit (HOSPITAL_COMMUNITY)
Admission: RE | Admit: 2024-05-18 | Discharge: 2024-05-18 | Disposition: A | Discharge status: Home / Self Care | Source: Ambulatory Visit | Attending: Family Medicine | Admitting: Family Medicine

## 2024-05-18 ENCOUNTER — Ambulatory Visit (INDEPENDENT_AMBULATORY_CARE_PROVIDER_SITE_OTHER): Admitting: Family Medicine

## 2024-05-18 VITALS — BP 115/74 | HR 79 | Temp 97.7°F | Ht 74.0 in | Wt 282.4 lb

## 2024-05-18 DIAGNOSIS — K5900 Constipation, unspecified: Secondary | ICD-10-CM | POA: Diagnosis not present

## 2024-05-18 DIAGNOSIS — E7849 Other hyperlipidemia: Secondary | ICD-10-CM

## 2024-05-18 DIAGNOSIS — N1831 Chronic kidney disease, stage 3a: Secondary | ICD-10-CM

## 2024-05-18 DIAGNOSIS — Z7984 Long term (current) use of oral hypoglycemic drugs: Secondary | ICD-10-CM

## 2024-05-18 DIAGNOSIS — E1169 Type 2 diabetes mellitus with other specified complication: Secondary | ICD-10-CM

## 2024-05-18 MED ORDER — POLYETHYLENE GLYCOL 3350 17 GM/SCOOP PO POWD: 17.0000 g | Freq: Two times a day (BID) | ORAL | 1 refills | Status: AC | PRN

## 2024-05-18 NOTE — Patient Instructions (Signed)
 Labs and xray today.  Medication as prescribed.

## 2024-05-19 LAB — HEMOGLOBIN A1C
Est. average glucose Bld gHb Est-mCnc: 163 mg/dL
Hgb A1c MFr Bld: 7.3 % — ABNORMAL HIGH (ref 4.8–5.6)

## 2024-05-19 LAB — LIPID PANEL
Chol/HDL Ratio: 2.6 ratio (ref 0.0–5.0)
Cholesterol, Total: 131 mg/dL (ref 100–199)
HDL: 50 mg/dL (ref >39)
LDL Chol Calc (NIH): 60 mg/dL (ref 0–99)
Triglycerides: 116 mg/dL (ref 0–149)
VLDL Cholesterol Cal: 21 mg/dL (ref 5–40)

## 2024-05-19 LAB — MICROALBUMIN / CREATININE URINE RATIO
Creatinine, Urine: 236.8 mg/dL
Microalb/Creat Ratio: 12 mg/g{creat} (ref 0–29)
Microalbumin, Urine: 27.5 ug/mL

## 2024-05-19 LAB — CBC
Hematocrit: 47.8 % (ref 37.5–51.0)
Hemoglobin: 15.4 g/dL (ref 13.0–17.7)
MCH: 29.5 pg (ref 26.6–33.0)
MCHC: 32.2 g/dL (ref 31.5–35.7)
MCV: 92 fL (ref 79–97)
Platelets: 230 x10E3/uL (ref 150–450)
RBC: 5.22 x10E6/uL (ref 4.14–5.80)
RDW: 12.4 % (ref 11.6–15.4)
WBC: 5.8 x10E3/uL (ref 3.4–10.8)

## 2024-05-19 LAB — CMP14+EGFR
ALT: 25 IU/L (ref 0–44)
AST: 24 IU/L (ref 0–40)
Albumin: 4.4 g/dL (ref 3.9–4.9)
Alkaline Phosphatase: 64 IU/L (ref 44–121)
BUN/Creatinine Ratio: 17 (ref 10–24)
BUN: 29 mg/dL — ABNORMAL HIGH (ref 8–27)
Bilirubin Total: 0.6 mg/dL (ref 0.0–1.2)
CO2: 24 mmol/L (ref 20–29)
Calcium: 9.6 mg/dL (ref 8.6–10.2)
Chloride: 96 mmol/L (ref 96–106)
Creatinine, Ser: 1.68 mg/dL — ABNORMAL HIGH (ref 0.76–1.27)
Globulin, Total: 2.7 g/dL (ref 1.5–4.5)
Glucose: 153 mg/dL — ABNORMAL HIGH (ref 70–99)
Potassium: 4.8 mmol/L (ref 3.5–5.2)
Sodium: 137 mmol/L (ref 134–144)
Total Protein: 7.1 g/dL (ref 6.0–8.5)
eGFR: 43 mL/min/1.73 — ABNORMAL LOW (ref >59)

## 2024-05-21 ENCOUNTER — Ambulatory Visit: Payer: Self-pay | Admitting: Family Medicine

## 2024-05-21 NOTE — Assessment & Plan Note (Signed)
 KUB for further evaluation.  Treating with MiraLAX .

## 2024-05-21 NOTE — Progress Notes (Signed)
 Subjective:  Patient ID: Adam JONETTA Ermalinda Mickey., male    DOB: [birth year]  Age: 68 y.o. MRN: [id number]  CC:  Constipation   HPI:  68 year old male with the below mentioned medical problems who has not been seen for follow-up since last year presents for evaluation of the above.  Patient reports that he has had constipation for the past 6 days.  States that his last bowel movement was 3 days ago and was soft.  He states that he is just not going on a regular basis.  Denies abdominal pain.  Has used milk of magnesia.  Patient needs his routine labs.  He has not been seen here for follow-up recently.  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   Type 2 diabetes mellitus with other specified complication, without long-term current use of insulin  (HCC)    Pulmonary nodule     Social Hx   Social History   Socioeconomic History   Marital status: Married    Spouse name: Not on file   Number of children: 2   Years of education: Not on file   Highest education level: Not on file  Occupational History   Not on file  Tobacco Use   Smoking status: Never   Smokeless tobacco: Never  Vaping Use   Vaping status: Never Used  Substance and Sexual Activity   Alcohol use: No    Alcohol/week: 0.0 standard drinks of alcohol   Drug use: No   Sexual activity: Yes  Other Topics Concern   Not on file  Social History Narrative   Not on file   Social Drivers of Health   Financial Resource Strain: Low Risk  (06/17/2023)   Overall Financial Resource Strain (CARDIA)    Difficulty of Paying Living Expenses: Not hard at all  Food Insecurity: No Food Insecurity (06/17/2023)   Hunger Vital Sign    Worried About Running Out of Food in the Last  Year: Never true    Ran Out of Food in the Last Year: Never true  Transportation Needs: No Transportation Needs (06/17/2023)   PRAPARE - Administrator, Civil Service (Medical): No    Lack of Transportation (Non-Medical): No  Physical Activity: Sufficiently Active (06/17/2023)   Exercise Vital Sign    Days of Exercise per Week: 7 days    Minutes of Exercise per Session: 30 min  Stress: No Stress Concern Present (06/17/2023)   [employer] of Occupational Health - Occupational Stress Questionnaire    Feeling of Stress : Not at all  Social Connections: Socially Integrated (06/17/2023)   Social Connection and Isolation Panel    Frequency of Communication with Friends and Family: More than three times a week    Frequency of Social Gatherings with Friends and Family: More than three times a week    Attends Religious Services: More than 4 times per year    Active Member of [employer] or Organizations: Yes    Attends Engineer, structural: More than 4 times per year    Marital Status: Married    Review of Systems Per HPI  Objective:  BP 115/74   Pulse 79   Temp 97.7 F (36.5 C)   Ht 6' 2 (1.88 m)  Wt 282 lb 6.4 oz (128.1 kg)   SpO2 94%   BMI 36.26 kg/m      05/18/2024   10:15 AM 01/27/2024    8:47 AM 06/17/2023   11:34 AM  BP/Weight  Systolic BP 115 121 --  Diastolic BP 74 77 --  Wt. (Lbs) 282.4  285  BMI 36.26 kg/m2  36.59 kg/m2    Physical Exam  Lab Results  Component Value Date   WBC 5.8 05/18/2024   HGB 15.4 05/18/2024   HCT 47.8 05/18/2024   PLT 230 05/18/2024   GLUCOSE 153 (H) 05/18/2024   CHOL 131 05/18/2024   TRIG 116 05/18/2024   HDL 50 05/18/2024   LDLCALC 60 05/18/2024   ALT 25 05/18/2024   AST 24 05/18/2024   NA 137 05/18/2024   K 4.8 05/18/2024   CL 96 05/18/2024   CREATININE 1.68 (H) 05/18/2024   BUN 29 (H) 05/18/2024   CO2 24 05/18/2024   PSA <0.1 03/04/2020   INR 1.02 05/06/2015   HGBA1C 7.3 (H) 05/18/2024      Assessment & Plan:  Constipation, unspecified constipation type Assessment & Plan: KUB for further evaluation.  Treating with MiraLAX .  Orders: -     DG Abd 1 View  Type 2 diabetes mellitus with other specified complication, without long-term current use of insulin  (HCC) -     CMP14+EGFR -     Microalbumin / creatinine urine ratio -     Hemoglobin A1c  Other hyperlipidemia -     Lipid panel  CKD stage 3a, GFR 45-59 ml/min (HCC) -     CBC  Other orders -     Polyethylene Glycol 3350 ; Take 17 g by mouth 2 (two) times daily as needed for moderate constipation or mild constipation.  Dispense: 510 g; Refill: 1    Follow-up: Advised to follow-up with his primary care physician  Jacqulyn Ahle DO [facility]

## 2024-05-23 ENCOUNTER — Other Ambulatory Visit: Payer: Self-pay

## 2024-05-23 MED ORDER — METFORMIN HCL 500 MG PO TABS: 500.0000 mg | ORAL_TABLET | Freq: Two times a day (BID) | ORAL | 1 refills | Status: DC

## 2024-05-24 ENCOUNTER — Other Ambulatory Visit: Payer: Self-pay

## 2024-05-24 DIAGNOSIS — E1169 Type 2 diabetes mellitus with other specified complication: Secondary | ICD-10-CM

## 2024-05-24 DIAGNOSIS — N1831 Chronic kidney disease, stage 3a: Secondary | ICD-10-CM

## 2024-06-22 ENCOUNTER — Ambulatory Visit: Payer: Medicare HMO

## 2024-06-22 VITALS — Ht 74.0 in | Wt 282.0 lb

## 2024-06-22 DIAGNOSIS — Z0000 Encounter for general adult medical examination without abnormal findings: Secondary | ICD-10-CM

## 2024-06-22 NOTE — Progress Notes (Signed)
 Subjective:   Adam Lee. is a 68 y.o. who presents for a Medicare Wellness preventive visit.  As a reminder, Annual Wellness Visits don't include a physical exam, and some assessments may be limited, especially if this visit is performed virtually. We may recommend an in-person follow-up visit with your provider if needed.  Visit Complete: Virtual I connected with  Elber Galyean. on 06/22/24 by a audio enabled telemedicine application and verified that I am speaking with the correct person using two identifiers.  Patient Location: Home  Provider Location: Home Office  I discussed the limitations of evaluation and management by telemedicine. The patient expressed understanding and agreed to proceed.  Vital Signs: Because this visit was a virtual/telehealth visit, some criteria may be missing or patient reported. Any vitals not documented were not able to be obtained and vitals that have been documented are patient reported.  VideoDeclined- This patient declined Librarian, academic. Therefore the visit was completed with audio only.  Persons Participating in Visit: Patient.  AWV Questionnaire: No: Patient Medicare AWV questionnaire was not completed prior to this visit.  Cardiac Risk Factors include: advanced age (>22men, >60 women);diabetes mellitus;dyslipidemia;male gender;hypertension     Objective:    Today's Vitals   06/22/24 0804  Weight: 282 lb (127.9 kg)  Height: 6' 2 (1.88 m)   Body mass index is 36.21 kg/m.     06/22/2024    8:23 AM 06/17/2023   11:37 AM 01/14/2023    7:41 AM 01/12/2023   11:06 AM 04/13/2022    9:29 AM 04/07/2021    9:46 AM 10/27/2020    1:33 PM  Advanced Directives  Does Patient Have a Medical Advance Directive? No No No No No No No  Would patient like information on creating a medical advance directive? Yes (MAU/Ambulatory/Procedural Areas - Information given) No - Patient declined No - Patient declined No -  Patient declined No - Patient declined No - Patient declined     Current Medications (verified) Outpatient Encounter Medications as of 06/22/2024  Medication Sig   aspirin  81 MG chewable tablet Chew 81 mg by mouth daily.   atorvastatin  (LIPITOR) 10 MG tablet TAKE 1 TABLET BY MOUTH EVERY DAY   blood glucose meter kit and supplies Dispense based on patient and insurance preference. Use to check sugars once a day (FOR ICD-10 E10.9, E11.9).   glipiZIDE  (GLUCOTROL  XL) 5 MG 24 hr tablet TAKE 1 TABLET BY MOUTH EVERY DAY WITH BREAKFAST   Lancets (ONETOUCH DELICA PLUS LANCET33G) MISC USE TO OBTAIN A BLOOD SPECIMEN TO TEST BLOOD SUGAR DAILY   lisinopril  (ZESTRIL ) 10 MG tablet TAKE 1 TABLET BY MOUTH EVERY DAY   metFORMIN  (GLUCOPHAGE ) 500 MG tablet Take 1 tablet (500 mg total) by mouth 2 (two) times daily.   metoprolol  tartrate (LOPRESSOR ) 25 MG tablet Take 12.5 mg by mouth daily.   permethrin  (ELIMITE ) 5 % cream Apply 1 Application topically as needed.   polyethylene glycol powder (GLYCOLAX /MIRALAX ) 17 GM/SCOOP powder Take 17 g by mouth 2 (two) times daily as needed for moderate constipation or mild constipation.   Vitamin D , Ergocalciferol , (DRISDOL ) 1.25 MG (50000 UNIT) CAPS capsule TAKE 1 CAPSULE (50,000 UNITS TOTAL) BY MOUTH EVERY 7 (SEVEN) DAYS   No facility-administered encounter medications on file as of 06/22/2024.    Allergies (verified) Patient has no known allergies.   History: Past Medical History:  Diagnosis Date   Anginal pain (HCC) 2019   Diabetes mellitus without complication (HCC)  Dysrhythmia    stress related. fast HR   History of stress test 2014   intermediate risk   Hyperlipemia    Hypertension    Prostate cancer [facility])    Past Surgical History:  Procedure Laterality Date   COLONOSCOPY N/A 01/03/2020   Procedure: COLONOSCOPY;  Surgeon: Golda Claudis PENNER, MD;  [facility];  Service: Endoscopy;  Laterality: N/A;  1055   COLONOSCOPY WITH PROPOFOL  N/A 01/14/2023    Procedure: COLONOSCOPY WITH PROPOFOL ;  Surgeon: Eartha Angelia Sieving, MD;  [facility];  Service: Gastroenterology;  Laterality: N/A;  900am, asa 1-2   CYSTOSCOPY W/ RETROGRADES Bilateral 09/24/2021   Procedure: CYSTOSCOPY WITH PYELOGRAM;  Surgeon: Sherrilee Belvie CROME, MD;  Location: [facility];  Service: Urology;  Laterality: Bilateral;   CYSTOSCOPY WITH BIOPSY N/A 09/24/2021   Procedure: CYSTOSCOPY WITH BLADDER BIOPSY;  Surgeon: Sherrilee Belvie CROME, MD;  Location: [facility];  Service: Urology;  Laterality: N/A;   CYSTOSCOPY WITH FULGERATION N/A 09/24/2021   Procedure: CYSTOSCOPY WITH FULGERATION;  Surgeon: Sherrilee Belvie CROME, MD;  Location: [facility];  Service: Urology;  Laterality: N/A;   HERNIA REPAIR Left    2000, Dr. Oneil budge   LYMPHADENECTOMY Bilateral 01/23/2020   Procedure: LYMPHADENECTOMY;  Surgeon: Alvaro Hummer, MD;  Location: [facility];  Service: Urology;  Laterality: Bilateral;   POLYPECTOMY  01/03/2020   Procedure: POLYPECTOMY;  Surgeon: Golda Claudis PENNER, MD;  [facility];  Service: Endoscopy;;   POLYPECTOMY  01/14/2023   Procedure: POLYPECTOMY;  Surgeon: Eartha Angelia Sieving, MD;  [facility];  Service: Gastroenterology;;   ROBOT ASSISTED LAPAROSCOPIC RADICAL PROSTATECTOMY N/A 01/23/2020   Procedure: XI ROBOTIC ASSISTED LAPAROSCOPIC RADICAL PROSTATECTOMY;  Surgeon: Alvaro Hummer, MD;  Location: [facility];  Service: Urology;  Laterality: N/A;  3 HRS   Family History  Problem Relation Age of Onset   Diabetes Father    Heart disease Mother    Cancer Other    Breast cancer Neg Hx    Prostate cancer Neg Hx    Colon cancer Neg Hx    Pancreatic cancer Neg Hx    Social History   Socioeconomic History   Marital status: Married    Spouse name: Not on file   Number of children: 2   Years of education: Not on file   Highest education level: Not on file  Occupational History   Not on file  Tobacco Use   Smoking status: Never   Smokeless  tobacco: Never  Vaping Use   Vaping status: Never Used  Substance and Sexual Activity   Alcohol use: No    Alcohol/week: 0.0 standard drinks of alcohol   Drug use: No   Sexual activity: Yes  Other Topics Concern   Not on file  Social History Narrative   Not on file   Social Drivers of Health   Financial Resource Strain: Low Risk  (06/22/2024)   Overall Financial Resource Strain (CARDIA)    Difficulty of Paying Living Expenses: Not hard at all  Food Insecurity: No Food Insecurity (06/22/2024)   Hunger Vital Sign    Worried About Running Out of Food in the Last Year: Never true    Ran Out of Food in the Last Year: Never true  Transportation Needs: No Transportation Needs (06/22/2024)   PRAPARE - Administrator, Civil Service (Medical): No    Lack of Transportation (Non-Medical): No  Physical Activity: Sufficiently Active (06/22/2024)   Exercise Vital Sign  Days of Exercise per Week: 5 days    Minutes of Exercise per Session: 30 min  Stress: No Stress Concern Present (06/22/2024)   [employer] of Occupational Health - Occupational Stress Questionnaire    Feeling of Stress: Not at all  Social Connections: Socially Integrated (06/22/2024)   Social Connection and Isolation Panel    Frequency of Communication with Friends and Family: More than three times a week    Frequency of Social Gatherings with Friends and Family: More than three times a week    Attends Religious Services: More than 4 times per year    Active Member of [employer] or Organizations: Yes    Attends Engineer, structural: More than 4 times per year    Marital Status: Married    Tobacco Counseling Counseling given: Not Answered    Clinical Intake:  Pre-visit preparation completed: Yes  Pain : No/denies pain  Diabetes: Yes CBG done?: No Did pt. bring in CBG monitor from home?: No  Lab Results  Component Value Date   HGBA1C 7.3 (H) 05/18/2024   HGBA1C 6.8 (H) 02/18/2023   HGBA1C  8.3 (H) 09/10/2022     How often do you need to have someone help you when you read instructions, pamphlets, or other written materials from your doctor or pharmacy?: 1 - Never  Interpreter Needed?: No  Information entered by :: Charmaine Bloodgood LPN   Activities of Daily Living     06/22/2024    8:07 AM  In your present state of health, do you have any difficulty performing the following activities:  Hearing? 0  Vision? 0  Difficulty concentrating or making decisions? 0  Walking or climbing stairs? 0  Dressing or bathing? 0  Doing errands, shopping? 0  Preparing Food and eating ? N  Using the Toilet? N  In the past six months, have you accidently leaked urine? N  Do you have problems with loss of bowel control? N  Managing your Medications? N  Managing your Finances? N  Housekeeping or managing your Housekeeping? N    Patient Care Team: Alphonsa Glendia LABOR, MD as PCP - General (Family Medicine) Delford Maude BROCKS, MD as Consulting Physician (Cardiology) Sherrilee Belvie CROME, MD as Consulting Physician (Urology) Waddell Danelle ORN, MD as Consulting Physician (Cardiology) Eartha Flavors, Toribio, MD as Consulting Physician (Gastroenterology) Darroll Anes, DO (Optometry) Rachele Gaynell RAMAN, MD as Referring Physician (Nephrology)  I have updated your Care Teams any recent Medical Services you may have received from other providers in the past year.     Assessment:   This is a routine wellness examination for Zarek.  Hearing/Vision screen Hearing Screening - Comments:: Denies hearing difficulties   Vision Screening - Comments:: Wears rx glasses - up to date with routine eye exams with [facility]    Goals Addressed             This Visit's Progress    Maintain health and independence   On track      Depression Screen     06/22/2024    9:37 AM 06/17/2023   11:38 AM 03/11/2023   10:16 AM 11/24/2022    3:54 PM 11/24/2022    3:19 PM 09/10/2022   11:09 AM 04/13/2022     9:27 AM  PHQ 2/9 Scores  PHQ - 2 Score 0 0 0 0 0 0 1  PHQ- 9 Score  0   0 2     Fall Risk     06/22/2024  8:24 AM 06/17/2023   11:37 AM 03/11/2023   10:15 AM 11/24/2022    3:54 PM 11/24/2022    3:19 PM  Fall Risk   Falls in the past year? 0 0 0 0 0  Number falls in past yr: 0 0 0 0 0  Injury with Fall? 0 0 0 0 0  Risk for fall due to : No Fall Risks No Fall Risks     Follow up Falls prevention discussed;Education provided;Falls evaluation completed Falls prevention discussed       MEDICARE RISK AT HOME:  Medicare Risk at Home Any stairs in or around the home?: No If so, are there any without handrails?: No Home free of loose throw rugs in walkways, pet beds, electrical cords, etc?: Yes Adequate lighting in your home to reduce risk of falls?: Yes Life alert?: No Use of a cane, walker or w/c?: No Grab bars in the bathroom?: Yes Shower chair or bench in shower?: No Elevated toilet seat or a handicapped toilet?: Yes  TIMED UP AND GO:  Was the test performed?  No  Cognitive Function: 6CIT completed        06/22/2024    8:25 AM 06/17/2023   11:38 AM 04/13/2022    9:31 AM  6CIT Screen  What Year? 0 points 0 points 0 points  What month? 0 points 0 points 0 points  What time? 0 points 0 points 0 points  Count back from 20 0 points 0 points 0 points  Months in reverse 0 points 0 points 0 points  Repeat phrase 0 points 0 points 0 points  Total Score 0 points 0 points 0 points    Immunizations Immunization History  Administered Date(s) Administered   PNEUMOCOCCAL CONJUGATE-20 04/19/2024   Pneumococcal Conjugate-13 12/05/2018   Pneumococcal Polysaccharide-23 03/11/2020   Td 01/10/2019   Tdap 04/19/2024    Screening Tests Health Maintenance  Topic Date Due   Zoster Vaccines- Shingrix (1 of 2) Never done   FOOT EXAM  09/11/2023   Influenza Vaccine  01/01/2025 (Originally 05/04/2024)   HEMOGLOBIN A1C  11/18/2024   OPHTHALMOLOGY EXAM  03/02/2025   Diabetic kidney  evaluation - eGFR measurement  05/18/2025   Diabetic kidney evaluation - Urine ACR  05/18/2025   Medicare Annual Wellness (AWV)  06/22/2025   Colonoscopy  01/13/2030   DTaP/Tdap/Td (3 - Td or Tdap) 04/19/2034   Pneumococcal Vaccine: 50+ Years  Completed   Hepatitis C Screening  Completed   HPV VACCINES  Aged Out   Meningococcal B Vaccine  Aged Out   COVID-19 Vaccine  Discontinued    Health Maintenance Items Addressed: Information provided on vaccine recommendations   Additional Screening:  Vision Screening: Recommended annual ophthalmology exams for early detection of glaucoma and other disorders of the eye. Is the patient up to date with their annual eye exam?  Yes  Who is the provider or what is the name of the office in which the patient attends annual eye exams? [facility]   Dental Screening: Recommended annual dental exams for proper oral hygiene  Community Resource Referral / Chronic Care Management: CRR required this visit?  No   CCM required this visit?  No   Plan:    I have personally reviewed and noted the following in the patient's chart:   Medical and social history Use of alcohol, tobacco or illicit drugs  Current medications and supplements including opioid prescriptions. Patient is not currently taking opioid prescriptions. Functional ability and status Nutritional status  Physical activity Advanced directives List of other physicians Hospitalizations, surgeries, and ER visits in previous 12 months Vitals Screenings to include cognitive, depression, and falls Referrals and appointments  In addition, I have reviewed and discussed with patient certain preventive protocols, quality metrics, and best practice recommendations. A written personalized care plan for preventive services as well as general preventive health recommendations were provided to patient.   Lavelle Pfeiffer [place], [place]   [id number]   After Visit Summary: (MyChart) Due to  this being a telephonic visit, the after visit summary with patients personalized plan was offered to patient via MyChart   Notes: Nothing significant to report at this time.

## 2024-06-22 NOTE — Patient Instructions (Signed)
 Mr. Adam Lee,  Thank you for taking the time for your Medicare Wellness Visit. I appreciate your continued commitment to your health goals. Please review the care plan we discussed, and feel free to reach out if I can assist you further.  Medicare recommends these wellness visits once per year to help you and your care team stay ahead of potential health issues. These visits are designed to focus on prevention, allowing your provider to concentrate on managing your acute and chronic conditions during your regular appointments.  Please note that Annual Wellness Visits do not include a physical exam. Some assessments may be limited, especially if the visit was conducted virtually. If needed, we may recommend a separate in-person follow-up with your provider.  Ongoing Care Seeing your primary care provider every 3 to 6 months helps us  monitor your health and provide consistent, personalized care.   Referrals If a referral was made during today's visit and you haven't received any updates within two weeks, please contact the referred provider directly to check on the status.  Recommended Screenings:  Health Maintenance  Topic Date Due   Zoster (Shingles) Vaccine (1 of 2) Never done   Complete foot exam   09/11/2023   Flu Shot  01/01/2025*   Hemoglobin A1C  11/18/2024   Eye exam for diabetics  03/02/2025   Yearly kidney function blood test for diabetes  05/18/2025   Yearly kidney health urinalysis for diabetes  05/18/2025   Medicare Annual Wellness Visit  06/22/2025   Colon Cancer Screening  01/13/2030   DTaP/Tdap/Td vaccine (3 - Td or Tdap) 04/19/2034   Pneumococcal Vaccine for age over 64  Completed   Hepatitis C Screening  Completed   HPV Vaccine  Aged Out   Meningitis B Vaccine  Aged Out   COVID-19 Vaccine  Discontinued  *Topic was postponed. The date shown is not the original due date.       06/22/2024    8:23 AM  Advanced Directives  Does Patient Have a Medical Advance Directive?  No  Would patient like information on creating a medical advance directive? Yes (MAU/Ambulatory/Procedural Areas - Information given)   Advance Care Planning is important because it: Ensures you receive medical care that aligns with your values, goals, and preferences. Provides guidance to your family and loved ones, reducing the emotional burden of decision-making during critical moments.  Information on Advanced Care Planning can be found at [place]  Secretary of [facility] Advance Health Care Directives Advance Health Care Directives ([email or web address]   Vision: Annual vision screenings are recommended for early detection of glaucoma, cataracts, and diabetic retinopathy. These exams can also reveal signs of chronic conditions such as diabetes and high blood pressure.  Dental: Annual dental screenings help detect early signs of oral cancer, gum disease, and other conditions linked to overall health, including heart disease and diabetes.  Please see the attached documents for additional preventive care recommendations.

## 2024-07-16 DIAGNOSIS — N1831 Chronic kidney disease, stage 3a: Secondary | ICD-10-CM | POA: Diagnosis not present

## 2024-07-16 DIAGNOSIS — E1169 Type 2 diabetes mellitus with other specified complication: Secondary | ICD-10-CM | POA: Diagnosis not present

## 2024-07-17 ENCOUNTER — Ambulatory Visit: Payer: Self-pay | Admitting: Family Medicine

## 2024-07-17 LAB — BASIC METABOLIC PANEL WITH GFR
BUN/Creatinine Ratio: 18 (ref 10–24)
BUN: 25 mg/dL (ref 8–27)
CO2: 24 mmol/L (ref 20–29)
Calcium: 9.7 mg/dL (ref 8.6–10.2)
Chloride: 98 mmol/L (ref 96–106)
Creatinine, Ser: 1.41 mg/dL — ABNORMAL HIGH (ref 0.76–1.27)
Glucose: 146 mg/dL — ABNORMAL HIGH (ref 70–99)
Potassium: 4.4 mmol/L (ref 3.5–5.2)
Sodium: 138 mmol/L (ref 134–144)
eGFR: 54 mL/min/1.73 — ABNORMAL LOW (ref >59)

## 2024-07-17 LAB — HEMOGLOBIN A1C
Est. average glucose Bld gHb Est-mCnc: 148 mg/dL
Hgb A1c MFr Bld: 6.8 % — ABNORMAL HIGH (ref 4.8–5.6)

## 2024-07-18 ENCOUNTER — Encounter (INDEPENDENT_AMBULATORY_CARE_PROVIDER_SITE_OTHER): Payer: Self-pay | Admitting: Gastroenterology

## 2024-07-20 DIAGNOSIS — E559 Vitamin D deficiency, unspecified: Secondary | ICD-10-CM | POA: Diagnosis not present

## 2024-07-20 DIAGNOSIS — N1832 Chronic kidney disease, stage 3b: Secondary | ICD-10-CM | POA: Diagnosis not present

## 2024-07-20 DIAGNOSIS — R809 Proteinuria, unspecified: Secondary | ICD-10-CM | POA: Diagnosis not present

## 2024-07-20 DIAGNOSIS — E1122 Type 2 diabetes mellitus with diabetic chronic kidney disease: Secondary | ICD-10-CM | POA: Diagnosis not present

## 2024-08-16 DIAGNOSIS — E119 Type 2 diabetes mellitus without complications: Secondary | ICD-10-CM | POA: Diagnosis not present

## 2024-08-16 LAB — OPHTHALMOLOGY REPORT-SCANNED

## 2024-10-03 ENCOUNTER — Other Ambulatory Visit: Payer: Self-pay | Admitting: Family Medicine

## 2024-10-05 ENCOUNTER — Other Ambulatory Visit: Payer: Self-pay | Admitting: Family Medicine

## 2024-10-22 ENCOUNTER — Other Ambulatory Visit: Payer: Self-pay

## 2024-10-22 ENCOUNTER — Encounter (HOSPITAL_COMMUNITY)
Admission: RE | Admit: 2024-10-22 | Discharge: 2024-10-22 | Disposition: A | Discharge status: Home / Self Care | Source: Ambulatory Visit | Attending: Optometry | Admitting: Optometry

## 2024-10-22 ENCOUNTER — Encounter (HOSPITAL_COMMUNITY): Payer: Self-pay

## 2024-10-22 HISTORY — DX: Polyneuropathy, unspecified: G62.9

## 2024-10-22 NOTE — H&P (Signed)
 Surgical History & Physical  Patient Name: Adam Lee  DOB: 03-18-1956  Surgery: Cataract extraction with intraocular lens implant phacoemulsification; Right Eye Surgeon: Marsa Cleverly MD Surgery Date: 10/26/2024 Pre-Op Date: 10/17/2024  HPI: A 21 Yr. old male patient 1. The patient is here for a cataract eval. Pt. complains of difficulty when driving due to glare from headlights or sun, which began 1 years ago. Both eyes are affected. The episode is constant. The complaint is associated with blurry vision. This is negatively affecting patient's daily activities. Pt.'s blood sugar was 140 last night.  Medical History: Cataracts  Diabetes High Blood Pressure  Review of Systems Cardiovascular High Blood Pressure Endocrine Hyperthyroidism, Diabetes  Social Never smoked tobacco   Medication Prednisolone-moxiflox-bromfen,  Glipizide , Atorvastatin , Metformin , Lisinopril   Sx/Procedures Hernia Sx, Prostate  Drug Allergies  NKDA  History & Physical: Heent: cataracts NECK: supple without bruits LUNGS: lungs clear to auscultation CV: regular rate and rhythm Abdomen: soft and non-tender  Impression & Plan: Assessment: 1.  CATARACT NUCLEAR SCLEROSIS AGE RELATED; Both Eyes (H25.13) 2.  BLEPHARITIS; Right Upper Lid, Right Lower Lid, Left Upper Lid, Left Lower Lid (H01.001, H01.002,H01.004,H01.005) 3.  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  Diabetes Type 2 No retinopathy (E11.9)  Plan: 1.  Cataracts are visually significant and account for the patient's complaints. Discussed all risks, benefits, procedures and recovery, including infection, loss of vision and eye, need for glasses after surgery or additional procedures. Patient understands changing glasses will not improve vision. Patient indicated understanding of procedure. All questions answered. Patient desires to have surgery, recommend phacoemulsification with intraocular lens. Patient  to have preliminary testing necessary ([employer]os/IOL Master, Mac OCT, TOPO) Educational materials provided:Cataract.  Plan: - Proceed with cataract surgery OD, followed by OS - Plan for best distance target with DIB00 - No DM, no fuchs, no prior eye surgery - good dilation - discussed astigmatism and will need glasses after surgery likely - Dextenza  if available  2.  Blepharitis with telangiectasias and blocked glands. Will use warm compresses once daily long term. Lid scrubs one to two times weekly.  3.  Dry eye. Mild signs of dry eye at this time. Can use artificial tears QID OU PRN and warm compresses once daily as needed.  4.  Diabetes type II : no background retinopathy, no signs of neovascularization noted. Discussed ocular and systemic benefits of blood sugar control.  - Patient encouraged to maintain good control of blood sugar, blood pressure, and cholesterol - Patient instructed to call with any visual changes - Recommend yearly dilated exams - Letter sent to PCP/Endocrinologist

## 2024-10-26 ENCOUNTER — Other Ambulatory Visit: Payer: Self-pay

## 2024-10-26 ENCOUNTER — Encounter (HOSPITAL_COMMUNITY)
Admission: RE | Disposition: A | Discharge status: Home / Self Care | Payer: Self-pay | Source: Home / Self Care | Attending: Optometry

## 2024-10-26 ENCOUNTER — Ambulatory Visit (HOSPITAL_COMMUNITY)
Admission: RE | Admit: 2024-10-26 | Discharge: 2024-10-26 | Disposition: A | Discharge status: Home / Self Care | Attending: Optometry | Admitting: Optometry

## 2024-10-26 ENCOUNTER — Ambulatory Visit (HOSPITAL_COMMUNITY)

## 2024-10-26 ENCOUNTER — Encounter (HOSPITAL_COMMUNITY): Payer: Self-pay | Admitting: Optometry

## 2024-10-26 DIAGNOSIS — I10 Essential (primary) hypertension: Secondary | ICD-10-CM | POA: Diagnosis not present

## 2024-10-26 DIAGNOSIS — I493 Ventricular premature depolarization: Secondary | ICD-10-CM | POA: Insufficient documentation

## 2024-10-26 DIAGNOSIS — H2511 Age-related nuclear cataract, right eye: Secondary | ICD-10-CM | POA: Insufficient documentation

## 2024-10-26 DIAGNOSIS — H0100A Unspecified blepharitis right eye, upper and lower eyelids: Secondary | ICD-10-CM | POA: Diagnosis not present

## 2024-10-26 DIAGNOSIS — H16223 Keratoconjunctivitis sicca, not specified as Sjogren's, bilateral: Secondary | ICD-10-CM | POA: Diagnosis not present

## 2024-10-26 DIAGNOSIS — E1136 Type 2 diabetes mellitus with diabetic cataract: Secondary | ICD-10-CM | POA: Diagnosis not present

## 2024-10-26 DIAGNOSIS — I209 Angina pectoris, unspecified: Secondary | ICD-10-CM | POA: Diagnosis not present

## 2024-10-26 DIAGNOSIS — H0100B Unspecified blepharitis left eye, upper and lower eyelids: Secondary | ICD-10-CM | POA: Diagnosis not present

## 2024-10-26 LAB — GLUCOSE, CAPILLARY: Glucose-Capillary: 120 mg/dL — ABNORMAL HIGH (ref 70–99)

## 2024-10-26 MED ORDER — MOXIFLOXACIN HCL 5 MG/ML IO SOLN
INTRAOCULAR | Status: DC | PRN
  Administered 2024-10-26: .2 mL via INTRACAMERAL

## 2024-10-26 MED ORDER — PHENYLEPHRINE HCL 2.5 % OP SOLN
1.0000 [drp] | OPHTHALMIC | Status: AC
  Administered 2024-10-26 (×3): 1 [drp] via OPHTHALMIC

## 2024-10-26 MED ORDER — PHENYLEPHRINE-KETOROLAC 1-0.3 % IO SOLN
INTRAOCULAR | Status: DC | PRN
  Administered 2024-10-26: 500 mL via OPHTHALMIC

## 2024-10-26 MED ORDER — MIDAZOLAM HCL (PF) 2 MG/2ML IJ SOLN
INTRAMUSCULAR | Status: DC | PRN
  Administered 2024-10-26: 2 mg via INTRAVENOUS

## 2024-10-26 MED ORDER — TETRACAINE HCL 0.5 % OP SOLN
1.0000 [drp] | OPHTHALMIC | Status: AC
  Administered 2024-10-26 (×3): 1 [drp] via OPHTHALMIC

## 2024-10-26 MED ORDER — FENTANYL CITRATE (PF) 100 MCG/2ML IJ SOLN
INTRAMUSCULAR | Status: AC
  Filled 2024-10-26: qty 2

## 2024-10-26 MED ORDER — TROPICAMIDE 1 % OP SOLN
1.0000 [drp] | OPHTHALMIC | Status: AC
  Administered 2024-10-26 (×3): 1 [drp] via OPHTHALMIC

## 2024-10-26 MED ORDER — MIDAZOLAM HCL 2 MG/2ML IJ SOLN
INTRAMUSCULAR | Status: AC
  Filled 2024-10-26: qty 2

## 2024-10-26 MED ORDER — LIDOCAINE HCL 3.5 % OP GEL
1.0000 | Freq: Once | OPHTHALMIC | Status: AC
  Administered 2024-10-26: 1 via OPHTHALMIC

## 2024-10-26 MED ORDER — POVIDONE-IODINE 5 % OP SOLN
OPHTHALMIC | Status: DC | PRN
  Administered 2024-10-26: 1 via OPHTHALMIC

## 2024-10-26 MED ORDER — SIGHTPATH DOSE#1 NA HYALUR & NA CHOND-NA HYALUR IO KIT
PACK | INTRAOCULAR | Status: DC | PRN
  Administered 2024-10-26: 1 via OPHTHALMIC

## 2024-10-26 MED ORDER — BSS IO SOLN
INTRAOCULAR | Status: DC | PRN
  Administered 2024-10-26: 15 mL via INTRAOCULAR

## 2024-10-26 MED ORDER — STERILE WATER FOR IRRIGATION IR SOLN
Status: DC | PRN
  Administered 2024-10-26: 1

## 2024-10-26 MED ORDER — LACTATED RINGERS IV SOLN: INTRAVENOUS | Status: DC

## 2024-10-26 MED ORDER — LIDOCAINE HCL (PF) 1 % IJ SOLN
INTRAMUSCULAR | Status: DC | PRN
  Administered 2024-10-26: 1 mL

## 2024-10-26 NOTE — Op Note (Signed)
 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 Item Serial No. Manufacturer Lot No. LRB No. Used Action  LENS IOL TECNIS EYHANCE 22.0 - [id number] Intraocular Lens LENS IOL TECNIS EYHANCE 22.0 [id number] [employer]  Right 1 Implanted    Indications:  Visually significant age-related cataract, Right Eye  Procedure:  The patient was seen and identified in the pre-operative area. The operative eye was identified and dilated.  The operative eye was marked.  Topical anesthesia was administered to the operative eye.     The patient was then to the operative suite and placed in the supine position.  A timeout was performed confirming the patient, procedure to be performed, and all other relevant information.   The patient's face was prepped and draped in the usual fashion for intra-ocular surgery.  A lid speculum was placed into the operative eye and the surgical microscope moved into place and focused.  A superotemporal paracentesis was created using a 20 gauge paracentesis blade.  BSS mixed with Omidria, followed by 1% lidocaine  was injected into the anterior chamber.  Viscoelastic was injected into the anterior chamber.  A temporal clear-corneal main wound incision was created using a 2.45mm microkeratome.  A continuous curvilinear capsulorrhexis was initiated using an irrigating cystitome and completed using capsulorrhexis forceps.  Hydrodissection and hydrodeliniation were performed.  Viscoelastic  was injected into the anterior chamber.  A phacoemulsification handpiece and a chopper as a second instrument were used to remove the nucleus and epinucleus. The irrigation/aspiration handpiece was used to remove any remaining cortical material.   The capsular bag was reinflated with viscoelastic, checked, and found to be intact.  The intraocular lens was inserted into the capsular bag.  The irrigation/aspiration handpiece was used to remove any remaining viscoelastic.  The clear corneal wound and paracentesis wounds were then hydrated and checked with Weck-Cels to be watertight. Moxifloxacin was instilled into the anterior chamber.  The lid-speculum and drape were removed. The patient's face was cleaned with a wet and dry 4x4. A clear shield was taped over the eye. The patient was taken to the post-operative care unit in good condition, having tolerated the procedure well.  Post-Op Instructions: The patient will follow up at [facility] for a same day post-operative evaluation and will receive all other orders and instructions.

## 2024-10-26 NOTE — Transfer of Care (Signed)
 Immediate Anesthesia Transfer of Care Note  Patient: Adam Lee.  Procedure(s) Performed: PHACOEMULSIFICATION, CATARACT, WITH IOL INSERTION (Right: Eye)  Patient Location: PACU  Anesthesia Type:MAC  Level of Consciousness: awake and alert   Airway & Oxygen Therapy: Patient Spontanous Breathing and Patient connected to nasal cannula oxygen  Post-op Assessment: Report given to RN and Post -op Vital signs reviewed and stable  Post vital signs: Reviewed and stable  Last Vitals:  Vitals Value Taken Time  BP 128/84 10/26/24 10:01  Temp 36.7 C 10/26/24 10:01  Pulse 76 10/26/24 10:01  Resp 20 10/26/24 10:01  SpO2 99 % 10/26/24 10:01    Last Pain:  Vitals:   10/26/24 1001  TempSrc: Oral  PainSc: 0-No pain      Patients Stated Pain Goal: 4 (10/26/24 0845)  Complications: No notable events documented.

## 2024-10-26 NOTE — Interval H&P Note (Signed)
 History and Physical Interval Note:  10/26/2024 8:58 AM  [name].  has presented today for surgery, with the diagnosis of nuclear sclerotic cataract, right eye.  The various methods of treatment have been discussed with the patient and family. After consideration of risks, benefits and other options for treatment, the patient has consented to  Procedures: PHACOEMULSIFICATION, CATARACT, WITH IOL INSERTION (Right) as a surgical intervention.  The patient's history has been reviewed, patient examined, no change in status, stable for surgery.  I have reviewed the patient's chart and labs.  Questions were answered to the patient's satisfaction.    The H and P was reviewed and updated. The patient was examined.  No changes were found after exam.  The surgical eye was marked.   [name]

## 2024-10-26 NOTE — Anesthesia Preprocedure Evaluation (Signed)
"                                    Anesthesia Evaluation  Patient identified by MRN, date of birth, ID band Patient awake    Reviewed: Allergy & Precautions, H&P , NPO status , Patient's Chart, lab work & pertinent test results  Airway Mallampati: II  TM Distance: >3 FB Neck ROM: Full    Dental no notable dental hx.    Pulmonary neg pulmonary ROS   Pulmonary exam normal breath sounds clear to auscultation       Cardiovascular hypertension, + angina  Normal cardiovascular exam+ dysrhythmias  Rhythm:Regular Rate:Normal  Frequent PVC's   Neuro/Psych negative neurological ROS  negative psych ROS   GI/Hepatic negative GI ROS, Neg liver ROS,,,  Endo/Other  negative endocrine ROSdiabetes    Renal/GU negative Renal ROS  negative genitourinary   Musculoskeletal negative musculoskeletal ROS (+)    Abdominal   Peds negative pediatric ROS (+)  Hematology negative hematology ROS (+)   Anesthesia Other Findings   Reproductive/Obstetrics negative OB ROS                              Anesthesia Physical Anesthesia Plan  ASA: 2  Anesthesia Plan: General   Post-op Pain Management:    Induction: Intravenous  PONV Risk Score and Plan:   Airway Management Planned: Nasal Cannula  Additional Equipment:   Intra-op Plan:   Post-operative Plan:   Informed Consent: I have reviewed the patients History and Physical, chart, labs and discussed the procedure including the risks, benefits and alternatives for the proposed anesthesia with the patient or authorized representative who has indicated his/her understanding and acceptance.     Dental advisory given  Plan Discussed with: CRNA  Anesthesia Plan Comments:         Anesthesia Quick Evaluation  "

## 2024-10-26 NOTE — Discharge Instructions (Signed)
 Please discharge patient when stable, will follow up today with Dr. Ilsa Iha at the [facility] office immediately following discharge.  Leave shield in place until visit.  All paperwork with discharge instructions will be given at the office.  [facility] Address:  [address]  [place], [place] [address]  Dr. Chaya Jan Phone: [phone]

## 2024-10-27 ENCOUNTER — Encounter (HOSPITAL_COMMUNITY): Payer: Self-pay | Admitting: Optometry

## 2024-10-27 NOTE — Anesthesia Postprocedure Evaluation (Signed)
"   Anesthesia Post Note  Patient: Adam Lee.  Procedure(s) Performed: PHACOEMULSIFICATION, CATARACT, WITH IOL INSERTION (Right: Eye)  Patient location during evaluation: PACU Anesthesia Type: General Level of consciousness: awake and alert Pain management: pain level controlled Vital Signs Assessment: post-procedure vital signs reviewed and stable Respiratory status: spontaneous breathing, nonlabored ventilation, respiratory function stable and patient connected to nasal cannula oxygen Cardiovascular status: stable and blood pressure returned to baseline Postop Assessment: no apparent nausea or vomiting Anesthetic complications: no   No notable events documented.   Last Vitals:  Vitals:   10/26/24 0845 10/26/24 1001  BP: 126/81 128/84  Pulse: 72 76  Resp: 19 20  Temp: 36.8 C 36.7 C  SpO2: 99% 99%    Last Pain:  Vitals:   10/26/24 1001  TempSrc: Oral  PainSc: 0-No pain                 Andrea Limes      "

## 2024-10-29 ENCOUNTER — Encounter (HOSPITAL_COMMUNITY): Payer: Self-pay | Admitting: Optometry

## 2024-11-07 ENCOUNTER — Other Ambulatory Visit: Payer: Self-pay | Admitting: Family Medicine

## 2024-11-07 DIAGNOSIS — E7849 Other hyperlipidemia: Secondary | ICD-10-CM

## 2024-11-12 ENCOUNTER — Encounter (HOSPITAL_COMMUNITY): Admission: RE | Admit: 2024-11-12 | Source: Ambulatory Visit

## 2024-11-16 ENCOUNTER — Ambulatory Visit (HOSPITAL_COMMUNITY): Admission: RE | Admit: 2024-11-16 | Admitting: Optometry

## 2024-11-16 ENCOUNTER — Encounter: Admission: RE | Payer: Self-pay

## 2024-11-16 SURGERY — PHACOEMULSIFICATION, CATARACT, WITH IOL INSERTION
Anesthesia: Monitor Anesthesia Care | Laterality: Left

## 2025-01-15 ENCOUNTER — Other Ambulatory Visit

## 2025-01-23 ENCOUNTER — Ambulatory Visit: Admitting: Urology
